# Patient Record
Sex: Female | Born: 1935
Health system: Southern US, Community
[De-identification: ages and names within clinical notes are randomized; demographics above are authoritative.]

## PROBLEM LIST (undated history)

## (undated) DIAGNOSIS — I10 Essential (primary) hypertension: Secondary | ICD-10-CM

## (undated) DIAGNOSIS — M5136 Other intervertebral disc degeneration, lumbar region: Secondary | ICD-10-CM

## (undated) DIAGNOSIS — E119 Type 2 diabetes mellitus without complications: Secondary | ICD-10-CM

## (undated) DIAGNOSIS — M51369 Other intervertebral disc degeneration, lumbar region without mention of lumbar back pain or lower extremity pain: Secondary | ICD-10-CM

## (undated) HISTORY — PX: KNEE SURGERY: SHX244

---

## 2001-01-31 ENCOUNTER — Encounter: Admission: RE | Admit: 2001-01-31 | Discharge: 2001-01-31 | Payer: Self-pay | Admitting: Internal Medicine

## 2001-01-31 ENCOUNTER — Encounter: Payer: Self-pay | Admitting: Internal Medicine

## 2002-02-26 ENCOUNTER — Encounter: Payer: Self-pay | Admitting: Internal Medicine

## 2002-02-26 ENCOUNTER — Encounter: Admission: RE | Admit: 2002-02-26 | Discharge: 2002-02-26 | Payer: Self-pay | Admitting: Internal Medicine

## 2002-05-16 ENCOUNTER — Ambulatory Visit (HOSPITAL_COMMUNITY): Admission: RE | Admit: 2002-05-16 | Discharge: 2002-05-16 | Payer: Self-pay | Admitting: Gastroenterology

## 2003-03-19 ENCOUNTER — Encounter: Admission: RE | Admit: 2003-03-19 | Discharge: 2003-03-19 | Payer: Self-pay | Admitting: Internal Medicine

## 2003-03-19 ENCOUNTER — Encounter: Payer: Self-pay | Admitting: Internal Medicine

## 2004-03-19 ENCOUNTER — Ambulatory Visit (HOSPITAL_COMMUNITY): Admission: RE | Admit: 2004-03-19 | Discharge: 2004-03-19 | Payer: Self-pay | Admitting: Internal Medicine

## 2007-04-24 ENCOUNTER — Ambulatory Visit (HOSPITAL_COMMUNITY): Admission: RE | Admit: 2007-04-24 | Discharge: 2007-04-24 | Payer: Self-pay | Admitting: Gastroenterology

## 2009-03-11 ENCOUNTER — Encounter: Admission: RE | Admit: 2009-03-11 | Discharge: 2009-03-11 | Payer: Self-pay | Admitting: Hematology and Oncology

## 2009-09-17 ENCOUNTER — Inpatient Hospital Stay (HOSPITAL_COMMUNITY): Admission: RE | Admit: 2009-09-17 | Discharge: 2009-09-21 | Payer: Self-pay | Admitting: Orthopedic Surgery

## 2009-11-11 ENCOUNTER — Encounter: Admission: RE | Admit: 2009-11-11 | Discharge: 2009-11-11 | Payer: Self-pay | Admitting: Internal Medicine

## 2010-11-21 ENCOUNTER — Inpatient Hospital Stay (HOSPITAL_COMMUNITY): Admission: EM | Admit: 2010-11-21 | Discharge: 2010-11-23 | Payer: Self-pay | Admitting: Internal Medicine

## 2011-03-09 LAB — GIARDIA/CRYPTOSPORIDIUM SCREEN(EIA)
Cryptosporidium Screen (EIA): NEGATIVE
Giardia Screen - EIA: NEGATIVE

## 2011-03-09 LAB — CBC
HCT: 35.5 % — ABNORMAL LOW (ref 36.0–46.0)
HCT: 43.8 % (ref 36.0–46.0)
Hemoglobin: 11.6 g/dL — ABNORMAL LOW (ref 12.0–15.0)
Hemoglobin: 14.5 g/dL (ref 12.0–15.0)
MCH: 29.8 pg (ref 26.0–34.0)
MCH: 30 pg (ref 26.0–34.0)
MCHC: 32.7 g/dL (ref 30.0–36.0)
MCHC: 33.1 g/dL (ref 30.0–36.0)
MCV: 89.9 fL (ref 78.0–100.0)
MCV: 91.7 fL (ref 78.0–100.0)
Platelets: 118 10*3/uL — ABNORMAL LOW (ref 150–400)
Platelets: 161 10*3/uL (ref 150–400)
RBC: 3.87 MIL/uL (ref 3.87–5.11)
RBC: 4.87 MIL/uL (ref 3.87–5.11)
RDW: 12.8 % (ref 11.5–15.5)
RDW: 13.1 % (ref 11.5–15.5)
WBC: 11.3 10*3/uL — ABNORMAL HIGH (ref 4.0–10.5)
WBC: 3.1 10*3/uL — ABNORMAL LOW (ref 4.0–10.5)

## 2011-03-09 LAB — POCT I-STAT, CHEM 8
BUN: 25 mg/dL — ABNORMAL HIGH (ref 6–23)
Calcium, Ion: 1.17 mmol/L (ref 1.12–1.32)
Chloride: 107 mEq/L (ref 96–112)
Creatinine, Ser: 0.9 mg/dL (ref 0.4–1.2)
Glucose, Bld: 161 mg/dL — ABNORMAL HIGH (ref 70–99)
HCT: 47 % — ABNORMAL HIGH (ref 36.0–46.0)
Hemoglobin: 16 g/dL — ABNORMAL HIGH (ref 12.0–15.0)
Potassium: 3.7 mEq/L (ref 3.5–5.1)
Sodium: 142 mEq/L (ref 135–145)
TCO2: 24 mmol/L (ref 0–100)

## 2011-03-09 LAB — GLUCOSE, CAPILLARY
Glucose-Capillary: 105 mg/dL — ABNORMAL HIGH (ref 70–99)
Glucose-Capillary: 107 mg/dL — ABNORMAL HIGH (ref 70–99)
Glucose-Capillary: 137 mg/dL — ABNORMAL HIGH (ref 70–99)
Glucose-Capillary: 153 mg/dL — ABNORMAL HIGH (ref 70–99)
Glucose-Capillary: 87 mg/dL (ref 70–99)
Glucose-Capillary: 89 mg/dL (ref 70–99)
Glucose-Capillary: 94 mg/dL (ref 70–99)

## 2011-03-09 LAB — COMPREHENSIVE METABOLIC PANEL
ALT: 16 U/L (ref 0–35)
AST: 21 U/L (ref 0–37)
Albumin: 4.3 g/dL (ref 3.5–5.2)
Alkaline Phosphatase: 52 U/L (ref 39–117)
BUN: 20 mg/dL (ref 6–23)
CO2: 24 mEq/L (ref 19–32)
Calcium: 9.7 mg/dL (ref 8.4–10.5)
Chloride: 105 mEq/L (ref 96–112)
Creatinine, Ser: 0.77 mg/dL (ref 0.4–1.2)
GFR calc Af Amer: >60 mL/min (ref >60)
GFR calc non Af Amer: >60 mL/min (ref >60)
Glucose, Bld: 162 mg/dL — ABNORMAL HIGH (ref 70–99)
Potassium: 3.7 mEq/L (ref 3.5–5.1)
Sodium: 139 mEq/L (ref 135–145)
Total Bilirubin: 0.8 mg/dL (ref 0.3–1.2)
Total Protein: 7.2 g/dL (ref 6.0–8.3)

## 2011-03-09 LAB — URINALYSIS, ROUTINE W REFLEX MICROSCOPIC
Bilirubin Urine: NEGATIVE
Glucose, UA: NEGATIVE mg/dL
Glucose, UA: NEGATIVE mg/dL
Hgb urine dipstick: NEGATIVE
Ketones, ur: NEGATIVE mg/dL
Ketones, ur: NEGATIVE mg/dL
Nitrite: NEGATIVE
Nitrite: NEGATIVE
Protein, ur: NEGATIVE mg/dL
Protein, ur: NEGATIVE mg/dL
Specific Gravity, Urine: 1.023 (ref 1.005–1.030)
Specific Gravity, Urine: 1.029 (ref 1.005–1.030)
Urobilinogen, UA: 0.2 mg/dL (ref 0.0–1.0)
Urobilinogen, UA: 0.2 mg/dL (ref 0.0–1.0)
pH: 5 (ref 5.0–8.0)
pH: 5 (ref 5.0–8.0)

## 2011-03-09 LAB — STOOL CULTURE

## 2011-03-09 LAB — DIFFERENTIAL
Basophils Absolute: 0 10*3/uL (ref 0.0–0.1)
Basophils Relative: 0 % (ref 0–1)
Eosinophils Absolute: 0 10*3/uL (ref 0.0–0.7)
Eosinophils Relative: 0 % (ref 0–5)
Lymphocytes Relative: 5 % — ABNORMAL LOW (ref 12–46)
Lymphs Abs: 0.5 10*3/uL — ABNORMAL LOW (ref 0.7–4.0)
Monocytes Absolute: 0.6 10*3/uL (ref 0.1–1.0)
Monocytes Relative: 6 % (ref 3–12)
Neutro Abs: 10.1 10*3/uL — ABNORMAL HIGH (ref 1.7–7.7)
Neutrophils Relative %: 89 % — ABNORMAL HIGH (ref 43–77)

## 2011-03-09 LAB — URINE MICROSCOPIC-ADD ON

## 2011-03-09 LAB — BASIC METABOLIC PANEL
BUN: 6 mg/dL (ref 6–23)
CO2: 26 mEq/L (ref 19–32)
Calcium: 7.7 mg/dL — ABNORMAL LOW (ref 8.4–10.5)
Chloride: 113 mEq/L — ABNORMAL HIGH (ref 96–112)
Creatinine, Ser: 0.66 mg/dL (ref 0.4–1.2)
GFR calc Af Amer: >60 mL/min (ref >60)
GFR calc non Af Amer: >60 mL/min (ref >60)
Glucose, Bld: 90 mg/dL (ref 70–99)
Potassium: 3.4 mEq/L — ABNORMAL LOW (ref 3.5–5.1)
Sodium: 143 mEq/L (ref 135–145)

## 2011-03-09 LAB — LIPASE, BLOOD: Lipase: 25 U/L (ref 11–59)

## 2011-03-09 LAB — LACTIC ACID, PLASMA: Lactic Acid, Venous: 1.7 mmol/L (ref 0.5–2.2)

## 2011-04-02 LAB — CBC
HCT: 24 % — ABNORMAL LOW (ref 36.0–46.0)
HCT: 26 % — ABNORMAL LOW (ref 36.0–46.0)
HCT: 30 % — ABNORMAL LOW (ref 36.0–46.0)
HCT: 41.8 % (ref 36.0–46.0)
Hemoglobin: 10.4 g/dL — ABNORMAL LOW (ref 12.0–15.0)
Hemoglobin: 14.4 g/dL (ref 12.0–15.0)
Hemoglobin: 8.4 g/dL — ABNORMAL LOW (ref 12.0–15.0)
Hemoglobin: 8.9 g/dL — ABNORMAL LOW (ref 12.0–15.0)
MCHC: 34.3 g/dL (ref 30.0–36.0)
MCHC: 34.5 g/dL (ref 30.0–36.0)
MCHC: 34.5 g/dL (ref 30.0–36.0)
MCHC: 35.1 g/dL (ref 30.0–36.0)
MCV: 91.1 fL (ref 78.0–100.0)
MCV: 91.4 fL (ref 78.0–100.0)
MCV: 92 fL (ref 78.0–100.0)
MCV: 92.6 fL (ref 78.0–100.0)
Platelets: 139 10*3/uL — ABNORMAL LOW (ref 150–400)
Platelets: 178 10*3/uL (ref 150–400)
Platelets: 94 10*3/uL — ABNORMAL LOW (ref 150–400)
Platelets: 96 10*3/uL — ABNORMAL LOW (ref 150–400)
RBC: 2.63 MIL/uL — ABNORMAL LOW (ref 3.87–5.11)
RBC: 2.84 MIL/uL — ABNORMAL LOW (ref 3.87–5.11)
RBC: 3.24 MIL/uL — ABNORMAL LOW (ref 3.87–5.11)
RBC: 4.54 MIL/uL (ref 3.87–5.11)
RDW: 12.8 % (ref 11.5–15.5)
RDW: 12.8 % (ref 11.5–15.5)
RDW: 12.9 % (ref 11.5–15.5)
RDW: 13.2 % (ref 11.5–15.5)
WBC: 10 10*3/uL (ref 4.0–10.5)
WBC: 5.8 10*3/uL (ref 4.0–10.5)
WBC: 6.3 10*3/uL (ref 4.0–10.5)
WBC: 7.1 10*3/uL (ref 4.0–10.5)

## 2011-04-02 LAB — URINALYSIS, ROUTINE W REFLEX MICROSCOPIC
Bilirubin Urine: NEGATIVE
Bilirubin Urine: NEGATIVE
Glucose, UA: NEGATIVE mg/dL
Glucose, UA: NEGATIVE mg/dL
Hgb urine dipstick: NEGATIVE
Ketones, ur: NEGATIVE mg/dL
Ketones, ur: NEGATIVE mg/dL
Nitrite: NEGATIVE
Nitrite: NEGATIVE
Protein, ur: NEGATIVE mg/dL
Protein, ur: NEGATIVE mg/dL
Specific Gravity, Urine: 1.01 (ref 1.005–1.030)
Specific Gravity, Urine: 1.012 (ref 1.005–1.030)
Urobilinogen, UA: 0.2 mg/dL (ref 0.0–1.0)
Urobilinogen, UA: 0.2 mg/dL (ref 0.0–1.0)
pH: 5.5 (ref 5.0–8.0)
pH: 5.5 (ref 5.0–8.0)

## 2011-04-02 LAB — BASIC METABOLIC PANEL
BUN: 12 mg/dL (ref 6–23)
BUN: 6 mg/dL (ref 6–23)
CO2: 29 mEq/L (ref 19–32)
CO2: 31 mEq/L (ref 19–32)
Calcium: 8.1 mg/dL — ABNORMAL LOW (ref 8.4–10.5)
Calcium: 8.5 mg/dL (ref 8.4–10.5)
Chloride: 100 mEq/L (ref 96–112)
Chloride: 102 mEq/L (ref 96–112)
Creatinine, Ser: 0.68 mg/dL (ref 0.4–1.2)
Creatinine, Ser: 0.76 mg/dL (ref 0.4–1.2)
GFR calc Af Amer: >60 mL/min (ref >60)
GFR calc Af Amer: >60 mL/min (ref >60)
GFR calc non Af Amer: >60 mL/min (ref >60)
GFR calc non Af Amer: >60 mL/min (ref >60)
Glucose, Bld: 164 mg/dL — ABNORMAL HIGH (ref 70–99)
Glucose, Bld: 166 mg/dL — ABNORMAL HIGH (ref 70–99)
Potassium: 3.7 mEq/L (ref 3.5–5.1)
Potassium: 4.2 mEq/L (ref 3.5–5.1)
Sodium: 133 mEq/L — ABNORMAL LOW (ref 135–145)
Sodium: 136 mEq/L (ref 135–145)

## 2011-04-02 LAB — COMPREHENSIVE METABOLIC PANEL
ALT: 18 U/L (ref 0–35)
AST: 24 U/L (ref 0–37)
Albumin: 4.2 g/dL (ref 3.5–5.2)
Alkaline Phosphatase: 49 U/L (ref 39–117)
BUN: 12 mg/dL (ref 6–23)
CO2: 27 mEq/L (ref 19–32)
Calcium: 10 mg/dL (ref 8.4–10.5)
Chloride: 106 mEq/L (ref 96–112)
Creatinine, Ser: 0.78 mg/dL (ref 0.4–1.2)
GFR calc Af Amer: >60 mL/min (ref >60)
GFR calc non Af Amer: >60 mL/min (ref >60)
Glucose, Bld: 134 mg/dL — ABNORMAL HIGH (ref 70–99)
Potassium: 4.3 mEq/L (ref 3.5–5.1)
Sodium: 141 mEq/L (ref 135–145)
Total Bilirubin: 0.9 mg/dL (ref 0.3–1.2)
Total Protein: 6.8 g/dL (ref 6.0–8.3)

## 2011-04-02 LAB — URINE MICROSCOPIC-ADD ON

## 2011-04-02 LAB — DIFFERENTIAL
Basophils Absolute: 0 10*3/uL (ref 0.0–0.1)
Basophils Relative: 0 % (ref 0–1)
Eosinophils Absolute: 0.1 10*3/uL (ref 0.0–0.7)
Eosinophils Relative: 2 % (ref 0–5)
Lymphocytes Relative: 33 % (ref 12–46)
Lymphs Abs: 1.9 10*3/uL (ref 0.7–4.0)
Monocytes Absolute: 0.4 10*3/uL (ref 0.1–1.0)
Monocytes Relative: 7 % (ref 3–12)
Neutro Abs: 3.4 10*3/uL (ref 1.7–7.7)
Neutrophils Relative %: 59 % (ref 43–77)

## 2011-04-02 LAB — TYPE AND SCREEN
ABO/RH(D): A POS
Antibody Screen: NEGATIVE

## 2011-04-02 LAB — PROTIME-INR
INR: 1 (ref 0.00–1.49)
INR: 1.1 (ref 0.00–1.49)
INR: 1.2 (ref 0.00–1.49)
INR: 1.5 (ref 0.00–1.49)
INR: 1.6 — ABNORMAL HIGH (ref 0.00–1.49)
Prothrombin Time: 12.7 seconds (ref 11.6–15.2)
Prothrombin Time: 13.8 seconds (ref 11.6–15.2)
Prothrombin Time: 14.9 seconds (ref 11.6–15.2)
Prothrombin Time: 18.3 seconds — ABNORMAL HIGH (ref 11.6–15.2)
Prothrombin Time: 18.6 seconds — ABNORMAL HIGH (ref 11.6–15.2)

## 2011-04-02 LAB — URINE CULTURE
Colony Count: NO GROWTH
Culture: NO GROWTH

## 2011-04-02 LAB — APTT: aPTT: 25 seconds (ref 24–37)

## 2011-04-02 LAB — ABO/RH: ABO/RH(D): A POS

## 2011-05-14 NOTE — Procedures (Signed)
[place]  Patient:    Molly Meyer, BOUIE Visit Number: [id number] MRN: [id number]          Service Type: END Location: ENDO Attending Physician:  Charna Elizabeth Dictated by:   Anselmo Rod, M.D. Proc. Date: 05/16/02 Admit Date:  05/16/2002   CC:         Velna Hatchet, M.D.   Procedure Report  DATE OF BIRTH:  March 03, 1934.  PROCEDURE:  Screening colonoscopy.  ENDOSCOPIST:  Anselmo Rod, M.D.  INSTRUMENT USED:  Adjustable pediatric colonoscope.  INDICATION FOR PROCEDURE:  A 77 year old white female undergoing screening colonoscopy.  The patients brother and sister have had colon cancer.  Rule out colonic polyps, masses, hemorrhoids, etc.  PREPROCEDURE PREPARATION:  Informed consent was procured from the patient. The patient was fasted for eight hours prior to the procedure and prepped with a bottle of magnesium citrate and a gallon of NuLytely the night prior to the procedure.  PREPROCEDURE PHYSICAL:  VITAL SIGNS:  The patient had stable vital signs.  NECK:  Supple.  CHEST:  Clear to auscultation.  S1, S2 regular.  ABDOMEN:  Soft with normal bowel sounds.  DESCRIPTION OF PROCEDURE:  The patient was placed in the left lateral decubitus position and sedated with 50 mg of Demerol and 4 mg of Versed intravenously.  Once the patient was adequately sedate and maintained on low-flow oxygen and continuous cardiac monitoring, the Olympus video colonoscope was advanced from the rectum to the cecum with slight difficulty. There was some residual stool in the colon.  Multiple washes were done.  A few left-sided diverticula were seen.  Small internal hemorrhoids were appreciated on retroflexion in the rectum.  No masses or polyps were present.  IMPRESSION: 1. Left-sided diverticulosis. 2. Small, nonbleeding internal hemorrhoids. 3. No masses or polyps seen.  RECOMMENDATIONS: 1. A high-fiber diet has been recommended for the  patient. 2. Repeat colorectal cancer screening is recommended in the next five years    unless the patient were to develop any abnormal symptoms in the interim. 3. Outpatient follow-up on a p.r.n. basis. Dictated by:   Anselmo Rod, M.D. Attending Physician:  Charna Elizabeth DD:  05/16/02 TD:  05/17/02 Job: [id number]

## 2011-05-14 NOTE — Op Note (Signed)
NAME:  Molly Meyer, Molly Meyer              ACCOUNT NO.:  [id number]   MEDICAL RECORD NO.:  [id number]          PATIENT TYPE:  AMB   LOCATION:  ENDO                         FACILITY:  [facility]   PHYSICIAN:  Anselmo Rod, M.D.  DATE OF BIRTH:  06-21-1924   DATE OF PROCEDURE:  04/24/2007  DATE OF DISCHARGE:                               OPERATIVE REPORT   PROCEDURE PERFORMED:  Screening colonoscopy.   INSTRUMENT USED:  Pentax video colonoscope.   INDICATION FOR PROCEDURE:  An 76 year old white female with a family  history of colon cancer in a brother and a sister undergoing a screening  colonoscopy to rule out colonic polyps, masses, etc.  Pre-procedure  preparation and informed consent was secured from the patient.  The  patient fasted for 8 hours prior to the procedure, and prepped Dulcolax  pills and a bottle of magnesium citrate, along with a gallon of TriLyte  the night prior to the procedure. The risks and benefits of the  procedure, including anesthetic risks and missing cancer and polyp were  discussed with the patient as well.  On pre-procedure physical, the  patient has stable vital signs, neck supple, chest clear to  auscultation, S1 and S2 regular, and the abdomen was soft, with normal  bowel sounds.   DESCRIPTION OF THE PROCEDURE:  The patient was placed in the left  lateral decubitus position and sedated with 75 mcg of Fentanyl and 7.5  mg of Versed, given intravenously in slow incremental doses. Once the  patient was adequately sedated, maintained on low-flow oxygen and  continuous cardiac monitoring, the Pentax video colonoscope was advanced  from the rectum to the cecum. There was some residual stool in the  colon. Multiple washes were done. The appendiceal orifice and the  ileocecal valve were clearly visualized and photographed. A few  scattered diverticula were seen, with even more prominent changes in the  sigmoid colon.  Small internal hemorrhoids were appreciated on  retroflexion in the rectum. No masses or polyps were identified.  The  patient tolerated the procedure well, without complication.   IMPRESSION:  1. Some residual stool in the right colon, multiple washings done.  2. A few scattered diverticula with more prominent changes in the      sigmoid colon.  3. Small internal hemorrhoids.   RECOMMENDATIONS:  1. Continue a high-fiber diet with liberal fluid intake. Brochures on      diverticulosis have been given the patient for education.  2. The patient has a family history of colon cancer in 2 of first-      degree relatives.  Repeat colonoscopy will be done in the next 5      years.  If the patient has any abnormal symptoms, she is to contact      the office immediately for further recommendations.      Anselmo Rod, M.D.  Electronically Signed     JNM/MEDQ  D:  04/24/2007  T:  04/24/2007  Job:  [id number]   cc:   Candyce Churn. Allyne Gee, M.D.

## 2012-02-21 DIAGNOSIS — E1129 Type 2 diabetes mellitus with other diabetic kidney complication: Secondary | ICD-10-CM | POA: Diagnosis not present

## 2012-02-21 DIAGNOSIS — M2550 Pain in unspecified joint: Secondary | ICD-10-CM | POA: Diagnosis not present

## 2012-02-21 DIAGNOSIS — Z79899 Other long term (current) drug therapy: Secondary | ICD-10-CM | POA: Diagnosis not present

## 2012-02-21 DIAGNOSIS — Z0000 Encounter for general adult medical examination without abnormal findings: Secondary | ICD-10-CM | POA: Diagnosis not present

## 2012-02-21 DIAGNOSIS — N182 Chronic kidney disease, stage 2 (mild): Secondary | ICD-10-CM | POA: Diagnosis not present

## 2012-02-21 DIAGNOSIS — E7800 Pure hypercholesterolemia, unspecified: Secondary | ICD-10-CM | POA: Diagnosis not present

## 2012-02-21 DIAGNOSIS — I129 Hypertensive chronic kidney disease with stage 1 through stage 4 chronic kidney disease, or unspecified chronic kidney disease: Secondary | ICD-10-CM | POA: Diagnosis not present

## 2012-02-22 DIAGNOSIS — I10 Essential (primary) hypertension: Secondary | ICD-10-CM | POA: Diagnosis not present

## 2012-02-22 DIAGNOSIS — E1129 Type 2 diabetes mellitus with other diabetic kidney complication: Secondary | ICD-10-CM | POA: Diagnosis not present

## 2012-02-22 DIAGNOSIS — E7800 Pure hypercholesterolemia, unspecified: Secondary | ICD-10-CM | POA: Diagnosis not present

## 2012-03-21 DIAGNOSIS — K5730 Diverticulosis of large intestine without perforation or abscess without bleeding: Secondary | ICD-10-CM | POA: Diagnosis not present

## 2012-03-21 DIAGNOSIS — Z1211 Encounter for screening for malignant neoplasm of colon: Secondary | ICD-10-CM | POA: Diagnosis not present

## 2012-03-21 DIAGNOSIS — K5900 Constipation, unspecified: Secondary | ICD-10-CM | POA: Diagnosis not present

## 2012-03-21 DIAGNOSIS — Z800 Family history of malignant neoplasm of digestive organs: Secondary | ICD-10-CM | POA: Diagnosis not present

## 2012-04-12 DIAGNOSIS — E7800 Pure hypercholesterolemia, unspecified: Secondary | ICD-10-CM | POA: Diagnosis not present

## 2012-04-12 DIAGNOSIS — Z6827 Body mass index (BMI) 27.0-27.9, adult: Secondary | ICD-10-CM | POA: Diagnosis not present

## 2012-05-01 DIAGNOSIS — Z961 Presence of intraocular lens: Secondary | ICD-10-CM | POA: Diagnosis not present

## 2012-05-01 DIAGNOSIS — H2640 Unspecified secondary cataract: Secondary | ICD-10-CM | POA: Diagnosis not present

## 2012-05-01 DIAGNOSIS — E119 Type 2 diabetes mellitus without complications: Secondary | ICD-10-CM | POA: Diagnosis not present

## 2012-05-02 DIAGNOSIS — D2310 Other benign neoplasm of skin of unspecified eyelid, including canthus: Secondary | ICD-10-CM | POA: Diagnosis not present

## 2012-05-31 DIAGNOSIS — Z1211 Encounter for screening for malignant neoplasm of colon: Secondary | ICD-10-CM | POA: Diagnosis not present

## 2012-05-31 DIAGNOSIS — K5730 Diverticulosis of large intestine without perforation or abscess without bleeding: Secondary | ICD-10-CM | POA: Diagnosis not present

## 2012-05-31 DIAGNOSIS — Z800 Family history of malignant neoplasm of digestive organs: Secondary | ICD-10-CM | POA: Diagnosis not present

## 2012-06-19 DIAGNOSIS — E1129 Type 2 diabetes mellitus with other diabetic kidney complication: Secondary | ICD-10-CM | POA: Diagnosis not present

## 2012-06-19 DIAGNOSIS — Z79899 Other long term (current) drug therapy: Secondary | ICD-10-CM | POA: Diagnosis not present

## 2012-06-19 DIAGNOSIS — E7800 Pure hypercholesterolemia, unspecified: Secondary | ICD-10-CM | POA: Diagnosis not present

## 2012-06-19 DIAGNOSIS — N182 Chronic kidney disease, stage 2 (mild): Secondary | ICD-10-CM | POA: Diagnosis not present

## 2012-06-19 DIAGNOSIS — D692 Other nonthrombocytopenic purpura: Secondary | ICD-10-CM | POA: Diagnosis not present

## 2012-08-21 DIAGNOSIS — E1165 Type 2 diabetes mellitus with hyperglycemia: Secondary | ICD-10-CM | POA: Diagnosis not present

## 2012-08-21 DIAGNOSIS — Z79899 Other long term (current) drug therapy: Secondary | ICD-10-CM | POA: Diagnosis not present

## 2012-08-21 DIAGNOSIS — I129 Hypertensive chronic kidney disease with stage 1 through stage 4 chronic kidney disease, or unspecified chronic kidney disease: Secondary | ICD-10-CM | POA: Diagnosis not present

## 2012-08-21 DIAGNOSIS — E1129 Type 2 diabetes mellitus with other diabetic kidney complication: Secondary | ICD-10-CM | POA: Diagnosis not present

## 2012-08-21 DIAGNOSIS — N182 Chronic kidney disease, stage 2 (mild): Secondary | ICD-10-CM | POA: Diagnosis not present

## 2012-10-04 DIAGNOSIS — Z23 Encounter for immunization: Secondary | ICD-10-CM | POA: Diagnosis not present

## 2012-11-29 DIAGNOSIS — J209 Acute bronchitis, unspecified: Secondary | ICD-10-CM | POA: Diagnosis not present

## 2013-02-20 DIAGNOSIS — M899 Disorder of bone, unspecified: Secondary | ICD-10-CM | POA: Diagnosis not present

## 2013-02-20 DIAGNOSIS — M949 Disorder of cartilage, unspecified: Secondary | ICD-10-CM | POA: Diagnosis not present

## 2013-02-21 DIAGNOSIS — N182 Chronic kidney disease, stage 2 (mild): Secondary | ICD-10-CM | POA: Diagnosis not present

## 2013-02-21 DIAGNOSIS — E1129 Type 2 diabetes mellitus with other diabetic kidney complication: Secondary | ICD-10-CM | POA: Diagnosis not present

## 2013-02-21 DIAGNOSIS — Z79899 Other long term (current) drug therapy: Secondary | ICD-10-CM | POA: Diagnosis not present

## 2013-02-21 DIAGNOSIS — Z0000 Encounter for general adult medical examination without abnormal findings: Secondary | ICD-10-CM | POA: Diagnosis not present

## 2013-02-21 DIAGNOSIS — I129 Hypertensive chronic kidney disease with stage 1 through stage 4 chronic kidney disease, or unspecified chronic kidney disease: Secondary | ICD-10-CM | POA: Diagnosis not present

## 2013-02-21 DIAGNOSIS — E559 Vitamin D deficiency, unspecified: Secondary | ICD-10-CM | POA: Diagnosis not present

## 2013-02-21 DIAGNOSIS — E1165 Type 2 diabetes mellitus with hyperglycemia: Secondary | ICD-10-CM | POA: Diagnosis not present

## 2013-05-02 DIAGNOSIS — H04129 Dry eye syndrome of unspecified lacrimal gland: Secondary | ICD-10-CM | POA: Diagnosis not present

## 2013-05-02 DIAGNOSIS — H2640 Unspecified secondary cataract: Secondary | ICD-10-CM | POA: Diagnosis not present

## 2013-05-02 DIAGNOSIS — E119 Type 2 diabetes mellitus without complications: Secondary | ICD-10-CM | POA: Diagnosis not present

## 2013-05-02 DIAGNOSIS — Z961 Presence of intraocular lens: Secondary | ICD-10-CM | POA: Diagnosis not present

## 2013-06-20 DIAGNOSIS — Z79899 Other long term (current) drug therapy: Secondary | ICD-10-CM | POA: Diagnosis not present

## 2013-06-20 DIAGNOSIS — N182 Chronic kidney disease, stage 2 (mild): Secondary | ICD-10-CM | POA: Diagnosis not present

## 2013-06-20 DIAGNOSIS — E1165 Type 2 diabetes mellitus with hyperglycemia: Secondary | ICD-10-CM | POA: Diagnosis not present

## 2013-06-20 DIAGNOSIS — I129 Hypertensive chronic kidney disease with stage 1 through stage 4 chronic kidney disease, or unspecified chronic kidney disease: Secondary | ICD-10-CM | POA: Diagnosis not present

## 2013-06-20 DIAGNOSIS — E1129 Type 2 diabetes mellitus with other diabetic kidney complication: Secondary | ICD-10-CM | POA: Diagnosis not present

## 2013-06-20 DIAGNOSIS — L821 Other seborrheic keratosis: Secondary | ICD-10-CM | POA: Diagnosis not present

## 2013-07-27 DIAGNOSIS — I129 Hypertensive chronic kidney disease with stage 1 through stage 4 chronic kidney disease, or unspecified chronic kidney disease: Secondary | ICD-10-CM | POA: Diagnosis not present

## 2013-07-27 DIAGNOSIS — N182 Chronic kidney disease, stage 2 (mild): Secondary | ICD-10-CM | POA: Diagnosis not present

## 2013-07-27 DIAGNOSIS — IMO0001 Reserved for inherently not codable concepts without codable children: Secondary | ICD-10-CM | POA: Diagnosis not present

## 2013-07-27 DIAGNOSIS — N898 Other specified noninflammatory disorders of vagina: Secondary | ICD-10-CM | POA: Diagnosis not present

## 2013-07-31 DIAGNOSIS — M76829 Posterior tibial tendinitis, unspecified leg: Secondary | ICD-10-CM | POA: Diagnosis not present

## 2013-07-31 DIAGNOSIS — L608 Other nail disorders: Secondary | ICD-10-CM | POA: Diagnosis not present

## 2013-07-31 DIAGNOSIS — E1149 Type 2 diabetes mellitus with other diabetic neurological complication: Secondary | ICD-10-CM | POA: Diagnosis not present

## 2013-08-21 DIAGNOSIS — N950 Postmenopausal bleeding: Secondary | ICD-10-CM | POA: Diagnosis not present

## 2013-08-21 DIAGNOSIS — Z124 Encounter for screening for malignant neoplasm of cervix: Secondary | ICD-10-CM | POA: Diagnosis not present

## 2013-08-28 DIAGNOSIS — E1149 Type 2 diabetes mellitus with other diabetic neurological complication: Secondary | ICD-10-CM | POA: Diagnosis not present

## 2013-08-28 DIAGNOSIS — M76829 Posterior tibial tendinitis, unspecified leg: Secondary | ICD-10-CM | POA: Diagnosis not present

## 2013-08-28 DIAGNOSIS — M19079 Primary osteoarthritis, unspecified ankle and foot: Secondary | ICD-10-CM | POA: Diagnosis not present

## 2013-08-30 DIAGNOSIS — N950 Postmenopausal bleeding: Secondary | ICD-10-CM | POA: Diagnosis not present

## 2013-09-21 DIAGNOSIS — Z23 Encounter for immunization: Secondary | ICD-10-CM | POA: Diagnosis not present

## 2013-10-10 DIAGNOSIS — E1149 Type 2 diabetes mellitus with other diabetic neurological complication: Secondary | ICD-10-CM | POA: Diagnosis not present

## 2013-10-10 DIAGNOSIS — M76829 Posterior tibial tendinitis, unspecified leg: Secondary | ICD-10-CM | POA: Diagnosis not present

## 2013-10-10 DIAGNOSIS — M19079 Primary osteoarthritis, unspecified ankle and foot: Secondary | ICD-10-CM | POA: Diagnosis not present

## 2013-10-17 DIAGNOSIS — Z79899 Other long term (current) drug therapy: Secondary | ICD-10-CM | POA: Diagnosis not present

## 2013-10-17 DIAGNOSIS — N189 Chronic kidney disease, unspecified: Secondary | ICD-10-CM | POA: Diagnosis not present

## 2013-10-17 DIAGNOSIS — E1129 Type 2 diabetes mellitus with other diabetic kidney complication: Secondary | ICD-10-CM | POA: Diagnosis not present

## 2013-10-17 DIAGNOSIS — I129 Hypertensive chronic kidney disease with stage 1 through stage 4 chronic kidney disease, or unspecified chronic kidney disease: Secondary | ICD-10-CM | POA: Diagnosis not present

## 2013-10-17 DIAGNOSIS — E663 Overweight: Secondary | ICD-10-CM | POA: Insufficient documentation

## 2013-10-17 DIAGNOSIS — E1165 Type 2 diabetes mellitus with hyperglycemia: Secondary | ICD-10-CM | POA: Diagnosis not present

## 2013-10-17 DIAGNOSIS — N058 Unspecified nephritic syndrome with other morphologic changes: Secondary | ICD-10-CM | POA: Diagnosis not present

## 2013-10-17 DIAGNOSIS — N182 Chronic kidney disease, stage 2 (mild): Secondary | ICD-10-CM | POA: Diagnosis not present

## 2013-10-17 DIAGNOSIS — Z006 Encounter for examination for normal comparison and control in clinical research program: Secondary | ICD-10-CM | POA: Diagnosis not present

## 2013-10-18 DIAGNOSIS — M899 Disorder of bone, unspecified: Secondary | ICD-10-CM | POA: Insufficient documentation

## 2013-10-18 DIAGNOSIS — E7800 Pure hypercholesterolemia, unspecified: Secondary | ICD-10-CM | POA: Insufficient documentation

## 2013-10-18 DIAGNOSIS — E1121 Type 2 diabetes mellitus with diabetic nephropathy: Secondary | ICD-10-CM | POA: Insufficient documentation

## 2013-10-18 DIAGNOSIS — I129 Hypertensive chronic kidney disease with stage 1 through stage 4 chronic kidney disease, or unspecified chronic kidney disease: Secondary | ICD-10-CM | POA: Insufficient documentation

## 2013-10-18 DIAGNOSIS — L821 Other seborrheic keratosis: Secondary | ICD-10-CM | POA: Insufficient documentation

## 2013-10-18 DIAGNOSIS — N182 Chronic kidney disease, stage 2 (mild): Secondary | ICD-10-CM | POA: Insufficient documentation

## 2013-10-18 DIAGNOSIS — M2550 Pain in unspecified joint: Secondary | ICD-10-CM | POA: Insufficient documentation

## 2013-10-18 DIAGNOSIS — M949 Disorder of cartilage, unspecified: Secondary | ICD-10-CM | POA: Insufficient documentation

## 2013-10-18 DIAGNOSIS — R310 Gross hematuria: Secondary | ICD-10-CM | POA: Insufficient documentation

## 2013-11-28 DIAGNOSIS — N950 Postmenopausal bleeding: Secondary | ICD-10-CM | POA: Diagnosis not present

## 2013-12-23 ENCOUNTER — Encounter (HOSPITAL_COMMUNITY): Payer: Self-pay | Admitting: Emergency Medicine

## 2013-12-23 ENCOUNTER — Emergency Department (INDEPENDENT_AMBULATORY_CARE_PROVIDER_SITE_OTHER): Payer: Medicare Other

## 2013-12-23 ENCOUNTER — Emergency Department (INDEPENDENT_AMBULATORY_CARE_PROVIDER_SITE_OTHER)
Admission: EM | Admit: 2013-12-23 | Discharge: 2013-12-23 | Disposition: A | Discharge status: Home / Self Care | Payer: Medicare Other | Source: Home / Self Care | Attending: Emergency Medicine | Admitting: Emergency Medicine

## 2013-12-23 DIAGNOSIS — R05 Cough: Secondary | ICD-10-CM | POA: Diagnosis not present

## 2013-12-23 DIAGNOSIS — J40 Bronchitis, not specified as acute or chronic: Secondary | ICD-10-CM

## 2013-12-23 DIAGNOSIS — R059 Cough, unspecified: Secondary | ICD-10-CM | POA: Diagnosis not present

## 2013-12-23 HISTORY — DX: Type 2 diabetes mellitus without complications: E11.9

## 2013-12-23 HISTORY — DX: Essential (primary) hypertension: I10

## 2013-12-23 MED ORDER — AZITHROMYCIN 250 MG PO TABS: 250.0000 mg | ORAL_TABLET | Freq: Every day | ORAL | Status: DC

## 2013-12-23 NOTE — ED Provider Notes (Signed)
CSN: [id number]     Arrival date & time 12/23/13  1146 History   First MD Initiated Contact with Patient 12/23/13 1301     Chief Complaint  Patient presents with  . Cough   (Consider location/radiation/quality/duration/timing/severity/associated sxs/prior Treatment) Patient is a 79 y.o. female presenting with cough. The history is provided by the patient. No language interpreter was used.  Cough Cough characteristics:  Productive Sputum characteristics:  Nondescript Severity:  Moderate Onset quality:  Sudden Duration:  2 days Timing:  Constant Progression:  Worsening Chronicity:  New Smoker: no   Relieved by:  Nothing Worsened by:  Nothing tried Ineffective treatments:  None tried Associated symptoms: chest pain and shortness of breath     Past Medical History  Diagnosis Date  . Diabetes mellitus without complication   . Hypertension    Past Surgical History  Procedure Laterality Date  . Knee surgery     History reviewed. No pertinent family history. History  Substance Use Topics  . Smoking status: Never Smoker   . Smokeless tobacco: Not on file  . Alcohol Use: No   OB History   Grav Para Term Preterm Abortions TAB SAB Ect Mult Living                 Review of Systems  Respiratory: Positive for cough and shortness of breath.   Cardiovascular: Positive for chest pain.  All other systems reviewed and are negative.    Allergies  Penicillins  Home Medications   Current Outpatient Rx  Name  Route  Sig  Dispense  Refill  . lisinopril-hydrochlorothiazide (PRINZIDE,ZESTORETIC) 10-12.5 MG per tablet   Oral   Take 1 tablet by mouth daily.         Marland Kitchen METFORMIN HCL ER PO   Oral   Take by mouth.         . Rosuvastatin Calcium (CRESTOR PO)   Oral   Take by mouth.          BP 141/87  Pulse 89  Temp(Src) 101.2 F (38.4 C) (Oral)  Resp 18  SpO2 92% Physical Exam  Nursing note and vitals reviewed. Constitutional: She is oriented to person, place,  and time. She appears well-developed and well-nourished.  HENT:  Head: Normocephalic.  Right Ear: External ear normal.  Left Ear: External ear normal.  Nose: Nose normal.  Mouth/Throat: Oropharynx is clear and moist.  Eyes: Conjunctivae and EOM are normal. Pupils are equal, round, and reactive to light.  Neck: Normal range of motion. Neck supple.  Cardiovascular: Normal rate.   Pulmonary/Chest: Effort normal and breath sounds normal.  Abdominal: Soft. Bowel sounds are normal.  Musculoskeletal: Normal range of motion.  Neurological: She is alert and oriented to person, place, and time. She has normal reflexes.  Skin: Skin is warm.  Psychiatric: She has a normal mood and affect.    ED Course  Procedures (including critical care time) Labs Review Labs Reviewed - No data to display Imaging Review Dg Chest 2 View  12/23/2013   CLINICAL DATA:  Two-day history of nonproductive cough.  EXAM: CHEST  2 VIEW  COMPARISON:  PA and lateral chest of March 11, 2009.  FINDINGS: The lungs are reasonably well inflated. There are coarse lung markings on the frontal film in the infrahilar region on the right. These likely lie posteriorly on the lateral film. These appears stable. Stable rounded calcified nodules in the right mid lung measuring up to 3 mm in diameter are consistent with previous granulomatous  infection. The cardiac silhouette is normal in size. There is tortuosity of the descending thoracic aorta. There is no pleural effusion or pneumothorax. The observed portions of the bony thorax exhibit no acute abnormalities.  IMPRESSION: There is no evidence of pneumonia nor CHF. I cannot exclude acute bronchitis in the appropriate clinical setting.   Electronically Signed   By: David  [place]   On: 12/23/2013 13:50    EKG Interpretation    Date/Time:    Ventricular Rate:    PR Interval:    QRS Duration:   QT Interval:    QTC Calculation:   R Axis:     Text Interpretation:               MDM   1. Bronchitis     Zithromax    Elson Areas, [place] 12/23/13 1428

## 2013-12-23 NOTE — ED Notes (Signed)
Pt  Reports  Symptoms  Of  Cough  /  Congested         Runny  Nose       Sinus  Drainage            Symptoms  X    2  Days  The  Cough is  Non  Productive  The  Pt is  Sitting  Upright on  Exam table       Family member  Ill  As   Well  With  Similar  Symptoms

## 2013-12-24 NOTE — ED Provider Notes (Signed)
Medical screening examination/treatment/procedure(s) were performed by a resident physician or non-physician practitioner and as the supervising physician I was immediately available for consultation/collaboration.  Aradia Estey, MD    Nakira Litzau S Brodie Correll, MD 12/24/13 0741 

## 2013-12-29 DIAGNOSIS — J3489 Other specified disorders of nose and nasal sinuses: Secondary | ICD-10-CM | POA: Diagnosis not present

## 2013-12-29 DIAGNOSIS — Z09 Encounter for follow-up examination after completed treatment for conditions other than malignant neoplasm: Secondary | ICD-10-CM | POA: Diagnosis not present

## 2013-12-29 DIAGNOSIS — J069 Acute upper respiratory infection, unspecified: Secondary | ICD-10-CM | POA: Diagnosis not present

## 2014-04-22 DIAGNOSIS — Z23 Encounter for immunization: Secondary | ICD-10-CM | POA: Diagnosis not present

## 2014-04-22 DIAGNOSIS — Z0000 Encounter for general adult medical examination without abnormal findings: Secondary | ICD-10-CM | POA: Diagnosis not present

## 2014-04-22 DIAGNOSIS — E559 Vitamin D deficiency, unspecified: Secondary | ICD-10-CM | POA: Diagnosis not present

## 2014-04-22 DIAGNOSIS — N182 Chronic kidney disease, stage 2 (mild): Secondary | ICD-10-CM | POA: Diagnosis not present

## 2014-04-22 DIAGNOSIS — I129 Hypertensive chronic kidney disease with stage 1 through stage 4 chronic kidney disease, or unspecified chronic kidney disease: Secondary | ICD-10-CM | POA: Diagnosis not present

## 2014-04-22 DIAGNOSIS — E1129 Type 2 diabetes mellitus with other diabetic kidney complication: Secondary | ICD-10-CM | POA: Diagnosis not present

## 2014-04-22 DIAGNOSIS — R209 Unspecified disturbances of skin sensation: Secondary | ICD-10-CM | POA: Diagnosis not present

## 2014-04-22 DIAGNOSIS — N058 Unspecified nephritic syndrome with other morphologic changes: Secondary | ICD-10-CM | POA: Diagnosis not present

## 2014-04-28 DIAGNOSIS — J349 Unspecified disorder of nose and nasal sinuses: Secondary | ICD-10-CM | POA: Insufficient documentation

## 2014-04-28 DIAGNOSIS — J3489 Other specified disorders of nose and nasal sinuses: Secondary | ICD-10-CM | POA: Insufficient documentation

## 2014-04-28 DIAGNOSIS — J069 Acute upper respiratory infection, unspecified: Secondary | ICD-10-CM | POA: Insufficient documentation

## 2014-04-30 DIAGNOSIS — D518 Other vitamin B12 deficiency anemias: Secondary | ICD-10-CM | POA: Diagnosis not present

## 2014-04-30 DIAGNOSIS — R5381 Other malaise: Secondary | ICD-10-CM | POA: Diagnosis not present

## 2014-05-05 DIAGNOSIS — R5381 Other malaise: Secondary | ICD-10-CM | POA: Insufficient documentation

## 2014-05-05 DIAGNOSIS — R5383 Other fatigue: Secondary | ICD-10-CM | POA: Insufficient documentation

## 2014-05-08 DIAGNOSIS — D518 Other vitamin B12 deficiency anemias: Secondary | ICD-10-CM | POA: Diagnosis not present

## 2014-05-15 DIAGNOSIS — D518 Other vitamin B12 deficiency anemias: Secondary | ICD-10-CM | POA: Diagnosis not present

## 2014-06-06 DIAGNOSIS — H11449 Conjunctival cysts, unspecified eye: Secondary | ICD-10-CM | POA: Diagnosis not present

## 2014-06-11 DIAGNOSIS — D2310 Other benign neoplasm of skin of unspecified eyelid, including canthus: Secondary | ICD-10-CM | POA: Diagnosis not present

## 2014-06-12 DIAGNOSIS — N182 Chronic kidney disease, stage 2 (mild): Secondary | ICD-10-CM | POA: Diagnosis not present

## 2014-06-12 DIAGNOSIS — D518 Other vitamin B12 deficiency anemias: Secondary | ICD-10-CM | POA: Diagnosis not present

## 2014-06-12 DIAGNOSIS — I129 Hypertensive chronic kidney disease with stage 1 through stage 4 chronic kidney disease, or unspecified chronic kidney disease: Secondary | ICD-10-CM | POA: Diagnosis not present

## 2014-06-12 DIAGNOSIS — E1129 Type 2 diabetes mellitus with other diabetic kidney complication: Secondary | ICD-10-CM | POA: Diagnosis not present

## 2014-07-18 DIAGNOSIS — N182 Chronic kidney disease, stage 2 (mild): Secondary | ICD-10-CM | POA: Diagnosis not present

## 2014-07-18 DIAGNOSIS — N058 Unspecified nephritic syndrome with other morphologic changes: Secondary | ICD-10-CM | POA: Diagnosis not present

## 2014-07-18 DIAGNOSIS — D518 Other vitamin B12 deficiency anemias: Secondary | ICD-10-CM | POA: Diagnosis not present

## 2014-07-18 DIAGNOSIS — E1165 Type 2 diabetes mellitus with hyperglycemia: Secondary | ICD-10-CM | POA: Diagnosis not present

## 2014-07-18 DIAGNOSIS — E1129 Type 2 diabetes mellitus with other diabetic kidney complication: Secondary | ICD-10-CM | POA: Diagnosis not present

## 2014-07-19 DIAGNOSIS — Z961 Presence of intraocular lens: Secondary | ICD-10-CM | POA: Diagnosis not present

## 2014-07-19 DIAGNOSIS — H524 Presbyopia: Secondary | ICD-10-CM | POA: Diagnosis not present

## 2014-07-19 DIAGNOSIS — E119 Type 2 diabetes mellitus without complications: Secondary | ICD-10-CM | POA: Diagnosis not present

## 2014-07-19 DIAGNOSIS — H04129 Dry eye syndrome of unspecified lacrimal gland: Secondary | ICD-10-CM | POA: Diagnosis not present

## 2014-07-21 DIAGNOSIS — N289 Disorder of kidney and ureter, unspecified: Secondary | ICD-10-CM | POA: Insufficient documentation

## 2014-08-07 DIAGNOSIS — E1129 Type 2 diabetes mellitus with other diabetic kidney complication: Secondary | ICD-10-CM | POA: Diagnosis not present

## 2014-08-07 DIAGNOSIS — N182 Chronic kidney disease, stage 2 (mild): Secondary | ICD-10-CM | POA: Diagnosis not present

## 2014-08-07 DIAGNOSIS — J019 Acute sinusitis, unspecified: Secondary | ICD-10-CM | POA: Diagnosis not present

## 2014-08-07 DIAGNOSIS — N058 Unspecified nephritic syndrome with other morphologic changes: Secondary | ICD-10-CM | POA: Diagnosis not present

## 2014-09-23 DIAGNOSIS — N182 Chronic kidney disease, stage 2 (mild): Secondary | ICD-10-CM | POA: Diagnosis not present

## 2014-09-23 DIAGNOSIS — Z79899 Other long term (current) drug therapy: Secondary | ICD-10-CM | POA: Diagnosis not present

## 2014-09-23 DIAGNOSIS — D518 Other vitamin B12 deficiency anemias: Secondary | ICD-10-CM | POA: Diagnosis not present

## 2014-09-23 DIAGNOSIS — I129 Hypertensive chronic kidney disease with stage 1 through stage 4 chronic kidney disease, or unspecified chronic kidney disease: Secondary | ICD-10-CM | POA: Diagnosis not present

## 2014-09-23 DIAGNOSIS — Z23 Encounter for immunization: Secondary | ICD-10-CM | POA: Diagnosis not present

## 2014-11-07 DIAGNOSIS — I129 Hypertensive chronic kidney disease with stage 1 through stage 4 chronic kidney disease, or unspecified chronic kidney disease: Secondary | ICD-10-CM | POA: Diagnosis not present

## 2014-11-07 DIAGNOSIS — M79672 Pain in left foot: Secondary | ICD-10-CM | POA: Diagnosis not present

## 2014-11-07 DIAGNOSIS — E1121 Type 2 diabetes mellitus with diabetic nephropathy: Secondary | ICD-10-CM | POA: Diagnosis not present

## 2014-11-07 DIAGNOSIS — Z13820 Encounter for screening for osteoporosis: Secondary | ICD-10-CM | POA: Diagnosis not present

## 2014-11-07 DIAGNOSIS — N182 Chronic kidney disease, stage 2 (mild): Secondary | ICD-10-CM | POA: Diagnosis not present

## 2014-11-07 DIAGNOSIS — M85859 Other specified disorders of bone density and structure, unspecified thigh: Secondary | ICD-10-CM | POA: Diagnosis not present

## 2014-11-25 DIAGNOSIS — E1121 Type 2 diabetes mellitus with diabetic nephropathy: Secondary | ICD-10-CM | POA: Diagnosis not present

## 2014-12-04 ENCOUNTER — Ambulatory Visit: Payer: Self-pay | Admitting: Podiatry

## 2015-01-01 ENCOUNTER — Ambulatory Visit (INDEPENDENT_AMBULATORY_CARE_PROVIDER_SITE_OTHER): Payer: Medicare Other | Admitting: Podiatry

## 2015-01-01 ENCOUNTER — Encounter: Payer: Self-pay | Admitting: Podiatry

## 2015-01-01 VITALS — BP 135/77 | HR 69 | Ht 67.5 in | Wt 170.0 lb

## 2015-01-01 DIAGNOSIS — E1140 Type 2 diabetes mellitus with diabetic neuropathy, unspecified: Secondary | ICD-10-CM | POA: Insufficient documentation

## 2015-01-01 DIAGNOSIS — B351 Tinea unguium: Secondary | ICD-10-CM

## 2015-01-01 DIAGNOSIS — S93303A Unspecified subluxation of unspecified foot, initial encounter: Secondary | ICD-10-CM | POA: Insufficient documentation

## 2015-01-01 DIAGNOSIS — E0840 Diabetes mellitus due to underlying condition with diabetic neuropathy, unspecified: Secondary | ICD-10-CM | POA: Insufficient documentation

## 2015-01-01 DIAGNOSIS — M79606 Pain in leg, unspecified: Secondary | ICD-10-CM

## 2015-01-01 DIAGNOSIS — E0842 Diabetes mellitus due to underlying condition with diabetic polyneuropathy: Secondary | ICD-10-CM

## 2015-01-01 DIAGNOSIS — M216X9 Other acquired deformities of unspecified foot: Secondary | ICD-10-CM | POA: Diagnosis not present

## 2015-01-01 NOTE — Progress Notes (Signed)
SUBJECTIVE: 81 y.o. year old female presents for diabetic foot care. Patient is ambulatory without assistance. Stated that she has foot, ankle and leg pain with ambulation and numbness in her foot and leg. Stated that she frequently lose balance during ambulation.   REVIEW OF SYSTEMS: Constitutional: negative for anorexia, chills, fatigue, fevers and sweats Eyes: negative Ears, nose, mouth, throat, and face: negative Respiratory: negative Cardiovascular: negative Gastrointestinal: negative Genitourinary:negative Integument/breast: negative Hematologic/lymphatic: negative Musculoskeletal:Has bursitis on shoulder, arthritis in knees and had total knee surgery on both side. Neurological: negative Behavioral/Psych: negative Endocrine: negative Allergic/Immunologic: negative  OBJECTIVE: DERMATOLOGIC EXAMINATION: All nails are thick dystrophic yellow nails x 10.  No open lesions or abnormal lesions noted.  VASCULAR EXAMINATION OF LOWER LIMBS: Pedal pulses: All pedal pulses are palpable with normal pulsation.  Capillary Filling times within 3 seconds in all digits.  No edema or erythema, no ischemic signs noted. Temperature gradient from tibial crest to dorsum of foot is within normal bilateral.  NEUROLOGIC EXAMINATION OF THE LOWER LIMBS: Achilles DTR is present and within normal. Fail to respond to Monofilament (Semmes-Weinstein 10-gm) sensory testing both feet. Decreased response to Vibratory sensations(128Hz  turning fork)  bilateral.  MUSCULOSKELETAL EXAMINATION: Positive for collapsed midtarsal bone (Talonavicular complex) bilateral. Flattened mid arch bilateral. Excess abduction and pronation of Subtalar joint bilateral. Excess ankle joint abduction with weight bearing bilateral.   ASSESSMENT: Diabetic neuropathy bilateral. Subluxed mid tarsal joint (Talonavicular complex) bilateral. Excess pronation STJ and ankle joint bilateral. Onychomycosis bilateral. Pain in lower  limb. High risk of fall.  PLAN: Discussed clinical findings and available treatment options; palliation, ankle support, regular check up, maintain normal blood sugar, nerve vitamins etc,. Discussed need for fall prevention program. Patient did not want to try [facility] Brace at this time. Patient requested diabetic shoes instead. All nails debrided. Necessary paper work dispensed for diabetic shoes.

## 2015-01-01 NOTE — Patient Instructions (Signed)
Seen for diabetic foot. Noted of peripheral neuropathy with high risk of fall.  Recommended high top well supported diabetic shoes. All nails debrided. Return in 3 months for routine foot care.

## 2015-04-02 ENCOUNTER — Ambulatory Visit (INDEPENDENT_AMBULATORY_CARE_PROVIDER_SITE_OTHER): Payer: Medicare Other | Admitting: Podiatry

## 2015-04-02 ENCOUNTER — Encounter: Payer: Self-pay | Admitting: Podiatry

## 2015-04-02 VITALS — BP 139/77 | HR 72 | Ht 68.0 in | Wt 170.0 lb

## 2015-04-02 DIAGNOSIS — M79606 Pain in leg, unspecified: Secondary | ICD-10-CM | POA: Diagnosis not present

## 2015-04-02 DIAGNOSIS — B351 Tinea unguium: Secondary | ICD-10-CM

## 2015-04-02 DIAGNOSIS — E0842 Diabetes mellitus due to underlying condition with diabetic polyneuropathy: Secondary | ICD-10-CM | POA: Diagnosis not present

## 2015-04-02 NOTE — Progress Notes (Signed)
SUBJECTIVE: 81 y.o. year old female presents requesting toe nails trimmed. Patient is ambulatory without assistance.  Denies any change since the last visit.  OBJECTIVE: DERMATOLOGIC EXAMINATION: All nails are thick dystrophic yellow nails x 10.  No open lesions or abnormal lesions noted.  VASCULAR EXAMINATION OF LOWER LIMBS: Pedal pulses: All pedal pulses are palpable with normal pulsation. Capillary Filling times within 3 seconds in all digits.  No edema or erythema, no ischemic signs noted. Temperature gradient from tibial crest to dorsum of foot is within normal bilateral. NEUROLOGIC EXAMINATION OF THE LOWER LIMBS: All epicritic and tactile sensations grossly intact.  MUSCULOSKELETAL EXAMINATION: Positive for collapsed midtarsal bone (Talonavicular complex) bilateral. Flattened mid arch bilateral. Excess abduction and pronation of Subtalar joint bilateral. Excess ankle joint abduction with weight bearing bilateral.   ASSESSMENT: Diabetic neuropathy bilateral. Subluxed mid tarsal joint (Talonavicular complex) bilateral. Excess pronation STJ and ankle joint bilateral. Onychomycosis bilateral. Pain in lower limb. High risk of fall.  PLAN: All nails debrided. Return in 3 months.

## 2015-04-02 NOTE — Patient Instructions (Signed)
Seen for hypertrophic nails. All nails debrided. Return in 3 months or as needed.  

## 2015-05-08 DIAGNOSIS — M25519 Pain in unspecified shoulder: Secondary | ICD-10-CM | POA: Diagnosis not present

## 2015-05-08 DIAGNOSIS — N08 Glomerular disorders in diseases classified elsewhere: Secondary | ICD-10-CM | POA: Diagnosis not present

## 2015-05-08 DIAGNOSIS — Z0000 Encounter for general adult medical examination without abnormal findings: Secondary | ICD-10-CM | POA: Diagnosis not present

## 2015-05-08 DIAGNOSIS — I129 Hypertensive chronic kidney disease with stage 1 through stage 4 chronic kidney disease, or unspecified chronic kidney disease: Secondary | ICD-10-CM | POA: Diagnosis not present

## 2015-05-08 DIAGNOSIS — E1122 Type 2 diabetes mellitus with diabetic chronic kidney disease: Secondary | ICD-10-CM | POA: Diagnosis not present

## 2015-05-08 DIAGNOSIS — N182 Chronic kidney disease, stage 2 (mild): Secondary | ICD-10-CM | POA: Diagnosis not present

## 2015-05-08 DIAGNOSIS — E559 Vitamin D deficiency, unspecified: Secondary | ICD-10-CM | POA: Diagnosis not present

## 2015-06-20 ENCOUNTER — Emergency Department (HOSPITAL_BASED_OUTPATIENT_CLINIC_OR_DEPARTMENT_OTHER): Payer: Medicare Other

## 2015-06-20 ENCOUNTER — Encounter (HOSPITAL_BASED_OUTPATIENT_CLINIC_OR_DEPARTMENT_OTHER): Payer: Self-pay | Admitting: *Deleted

## 2015-06-20 ENCOUNTER — Emergency Department (HOSPITAL_BASED_OUTPATIENT_CLINIC_OR_DEPARTMENT_OTHER)
Admission: EM | Admit: 2015-06-20 | Discharge: 2015-06-20 | Disposition: A | Discharge status: Home / Self Care | Payer: Medicare Other | Attending: Emergency Medicine | Admitting: Emergency Medicine

## 2015-06-20 DIAGNOSIS — E119 Type 2 diabetes mellitus without complications: Secondary | ICD-10-CM | POA: Insufficient documentation

## 2015-06-20 DIAGNOSIS — M1611 Unilateral primary osteoarthritis, right hip: Secondary | ICD-10-CM | POA: Diagnosis not present

## 2015-06-20 DIAGNOSIS — M549 Dorsalgia, unspecified: Secondary | ICD-10-CM | POA: Insufficient documentation

## 2015-06-20 DIAGNOSIS — I10 Essential (primary) hypertension: Secondary | ICD-10-CM | POA: Insufficient documentation

## 2015-06-20 DIAGNOSIS — M25551 Pain in right hip: Secondary | ICD-10-CM | POA: Insufficient documentation

## 2015-06-20 DIAGNOSIS — Z880 Allergy status to penicillin: Secondary | ICD-10-CM | POA: Insufficient documentation

## 2015-06-20 DIAGNOSIS — Z79899 Other long term (current) drug therapy: Secondary | ICD-10-CM | POA: Insufficient documentation

## 2015-06-20 DIAGNOSIS — M25559 Pain in unspecified hip: Secondary | ICD-10-CM

## 2015-06-20 MED ORDER — NAPROXEN 375 MG PO TABS: 375.0000 mg | ORAL_TABLET | Freq: Two times a day (BID) | ORAL | Status: DC

## 2015-06-20 MED ORDER — ACETAMINOPHEN 500 MG PO TABS: 1000.0000 mg | ORAL_TABLET | Freq: Four times a day (QID) | ORAL | Status: DC | PRN

## 2015-06-20 MED ORDER — LIDOCAINE 5 % EX PTCH: 1.0000 | MEDICATED_PATCH | CUTANEOUS | Status: DC

## 2015-06-20 NOTE — ED Notes (Signed)
Pt amb to room 10 with quick steady gait in nad. Pt reports right hip pain x 2 days, denies any injury or trauma, pt states she sits in a recliner "a lot" and wonders if that is related to her pain.

## 2015-06-20 NOTE — ED Notes (Signed)
Patient transported to X-ray 

## 2015-06-20 NOTE — ED Provider Notes (Signed)
CSN: [id number]     Arrival date & time 06/20/15  7425 History   First MD Initiated Contact with Patient 06/20/15 [phone]     Chief Complaint  Patient presents with  . Hip Pain     (Consider location/radiation/quality/duration/timing/severity/associated sxs/prior Treatment) Patient is a 81 y.o. female presenting with hip pain. The history is provided by the patient.  Hip Pain This is a new problem. Episode onset: 3-4 days. The problem occurs constantly. The problem has not changed (waxing and waning in severity) since onset.Pertinent negatives include no abdominal pain. Associated symptoms comments: No leg swelling, numbness, weakness.  No trauma or falls.  Normal bowel and bladder habits. The symptoms are aggravated by walking (prolonged standing). Relieved by: improved with sitting and lying down for awhile and then will get uncomfortable in those positions. She has tried acetaminophen for the symptoms. The treatment provided no relief.    Past Medical History  Diagnosis Date  . Diabetes mellitus without complication   . Hypertension    Past Surgical History  Procedure Laterality Date  . Knee surgery     History reviewed. No pertinent family history. History  Substance Use Topics  . Smoking status: Never Smoker   . Smokeless tobacco: Never Used  . Alcohol Use: No   OB History    No data available     Review of Systems  Gastrointestinal: Negative for abdominal pain.  All other systems reviewed and are negative.     Allergies  Penicillins  Home Medications   Prior to Admission medications   Medication Sig Start Date End Date Taking? Authorizing Provider  Cholecalciferol (VITAMIN D3) 2000 UNITS capsule 1 qd    Historical Provider, MD  glucose blood (ACCU-CHEK SMARTVIEW) test strip CHECK BLOOD SUGAR ONCE BEFORE BREAKFAST AND DINNER 10/14/14   Historical Provider, MD  Lancets Misc. (ACCU-CHEK FASTCLIX LANCET) KIT USE TO CHECK BS DAILY, DX: 250.02 06/25/13   Historical  Provider, MD  lisinopril-hydrochlorothiazide (PRINZIDE,ZESTORETIC) 10-12.5 MG per tablet Take 1 tablet by mouth daily.    Historical Provider, MD  metFORMIN (GLUCOPHAGE-XR) 500 MG 24 hr tablet TAKE ONE TABLET BY MOUTH EVERY DAY WITH DINNER. 10/04/14   Historical Provider, MD  Rosuvastatin Calcium (CRESTOR PO) Take by mouth.    Historical Provider, MD   BP 135/90 mmHg  Pulse 86  Temp(Src) 97.8 F (36.6 C) (Oral)  Resp 18  Ht $R'5\' 7"'Ti$  (1.702 m)  Wt 170 lb (77.111 kg)  BMI 26.62 kg/m2  SpO2 96% Physical Exam  Constitutional: She is oriented to person, place, and time. She appears well-developed and well-nourished. No distress.  HENT:  Head: Normocephalic and atraumatic.  Cardiovascular: Normal rate and intact distal pulses.   Pulmonary/Chest: Effort normal.  Musculoskeletal:       Right hip: She exhibits tenderness. She exhibits normal range of motion, normal strength, no swelling, no crepitus and no deformity.       Right knee: Normal.       Lumbar back: She exhibits no bony tenderness.       Back:       Legs: Neurological: She is alert and oriented to person, place, and time. She has normal strength. No sensory deficit.  Skin: Skin is warm and dry. No rash noted. No erythema.  Psychiatric: She has a normal mood and affect. Her behavior is normal.  Nursing note and vitals reviewed.   ED Course  Procedures (including critical care time) Labs Review Labs Reviewed - No data to display  Imaging  Review Dg Hip Unilat With Pelvis Min 4 Views Right  06/20/2015   CLINICAL DATA:  Pain for past several weeks, recently progressed. No history of recent trauma.  EXAM: RIGHT HIP (WITH PELVIS) 3 VIEWS  COMPARISON:  None.  FINDINGS: Frontal pelvis as well as frontal and lateral right hip images obtained. There is moderate narrowing of both hip joints. No fracture or dislocation. No erosive change. Bones are osteoporotic. There are multiple pelvic phleboliths.  IMPRESSION: Osteoarthritic change and  osteoporosis.  No fracture or dislocation.   Electronically Signed   By: Lowella Grip III M.D.   On: 06/20/2015 10:29     EKG Interpretation None      MDM   Final diagnoses:  Hip pain    Patient here with 3 days of right-sided back and hip pain radiating to the knee. Patient denies any trauma or identifiable injury. No prior symptoms similar to this. Pain is worse with extended amounts of standing and walking. Patient took Tylenol one time 3 days ago but has not taken anything else for the pain. She denies any swelling, redness or weakness to the leg. Normal bowel and bladder habits.  On exam patient has point tenderness to the right paralumbar and posterior hip region. She has minimal pain with range of motion of the hip and normal sensation. No rashes or evidence of zoster. Low suspicion for septic hip.  Feel this could be lumbar radiculopathy versus osteoarthritis of the hip. Hip films pending  10:39 AM Hip film shows osteoarthritis and discussed with patient that this could still be hip or back related. Will try a short course of naproxen, Tylenol and Lidoderm patches. Patient will follow-up with Dr. Baird Cancer if symptoms are not improving in 1-2 weeks.  Blanchie Dessert, MD 06/20/15 1042

## 2015-06-20 NOTE — ED Notes (Signed)
MD at bedside. 

## 2015-07-02 ENCOUNTER — Encounter: Payer: Self-pay | Admitting: Podiatry

## 2015-07-02 ENCOUNTER — Ambulatory Visit (INDEPENDENT_AMBULATORY_CARE_PROVIDER_SITE_OTHER): Payer: Medicare Other | Admitting: Podiatry

## 2015-07-02 VITALS — BP 156/80 | HR 81 | Ht 68.0 in | Wt 172.0 lb

## 2015-07-02 DIAGNOSIS — B351 Tinea unguium: Secondary | ICD-10-CM

## 2015-07-02 DIAGNOSIS — S93306D Unspecified dislocation of unspecified foot, subsequent encounter: Secondary | ICD-10-CM

## 2015-07-02 DIAGNOSIS — M79606 Pain in leg, unspecified: Secondary | ICD-10-CM

## 2015-07-02 DIAGNOSIS — E0842 Diabetes mellitus due to underlying condition with diabetic polyneuropathy: Secondary | ICD-10-CM | POA: Diagnosis not present

## 2015-07-02 DIAGNOSIS — M216X9 Other acquired deformities of unspecified foot: Secondary | ICD-10-CM | POA: Diagnosis not present

## 2015-07-02 DIAGNOSIS — M79673 Pain in unspecified foot: Secondary | ICD-10-CM | POA: Diagnosis not present

## 2015-07-02 DIAGNOSIS — S93303D Unspecified subluxation of unspecified foot, subsequent encounter: Secondary | ICD-10-CM

## 2015-07-02 NOTE — Patient Instructions (Signed)
Seen for hypertrophic nails. All nails debrided. Both feet measured for diabetic shoes. Return in 3 months or as needed.  

## 2015-07-02 NOTE — Progress Notes (Signed)
SUBJECTIVE: 81 y.o. year old female presents requesting toe nails trimmed and request for diabetic shoes. Patient is ambulatory without assistance and accompanied by her daughter. Denies any change since the last visit.  OBJECTIVE: DERMATOLOGIC EXAMINATION: All nails are thick dystrophic yellow nails x 10.  No open lesions or abnormal lesions noted.  VASCULAR EXAMINATION OF LOWER LIMBS: Pedal pulses: All pedal pulses are palpable with normal pulsation. Capillary Filling times within 3 seconds in all digits.  No edema or erythema, no ischemic signs noted. Temperature gradient from tibial crest to dorsum of foot is within normal bilateral. NEUROLOGIC EXAMINATION OF THE LOWER LIMBS: All epicritic and tactile sensations grossly intact.  MUSCULOSKELETAL EXAMINATION: Positive for collapsed midtarsal bone (Talonavicular complex) bilateral. Flattened mid arch bilateral. Excess abduction and pronation of Subtalar joint bilateral. Excess ankle joint abduction with weight bearing bilateral.   ASSESSMENT: Diabetic neuropathy bilateral. Subluxed mid tarsal joint (Talonavicular complex) bilateral. Excess pronation STJ and ankle joint bilateral. Onychomycosis bilateral. Pain in lower limb. High risk of fall.  PLAN: All nails debrided. Both feet measured for diabetic shoes. Return in 3 months

## 2015-07-21 DIAGNOSIS — H26493 Other secondary cataract, bilateral: Secondary | ICD-10-CM | POA: Diagnosis not present

## 2015-07-21 DIAGNOSIS — E119 Type 2 diabetes mellitus without complications: Secondary | ICD-10-CM | POA: Diagnosis not present

## 2015-07-21 DIAGNOSIS — H43813 Vitreous degeneration, bilateral: Secondary | ICD-10-CM | POA: Diagnosis not present

## 2015-07-21 DIAGNOSIS — H524 Presbyopia: Secondary | ICD-10-CM | POA: Diagnosis not present

## 2015-08-27 DIAGNOSIS — E1122 Type 2 diabetes mellitus with diabetic chronic kidney disease: Secondary | ICD-10-CM | POA: Diagnosis not present

## 2015-08-27 DIAGNOSIS — M25552 Pain in left hip: Secondary | ICD-10-CM | POA: Diagnosis not present

## 2015-08-27 DIAGNOSIS — N182 Chronic kidney disease, stage 2 (mild): Secondary | ICD-10-CM | POA: Diagnosis not present

## 2015-08-27 DIAGNOSIS — R110 Nausea: Secondary | ICD-10-CM | POA: Diagnosis not present

## 2015-08-27 DIAGNOSIS — N08 Glomerular disorders in diseases classified elsewhere: Secondary | ICD-10-CM | POA: Diagnosis not present

## 2015-09-04 DIAGNOSIS — M5432 Sciatica, left side: Secondary | ICD-10-CM | POA: Diagnosis not present

## 2015-09-05 DIAGNOSIS — M5442 Lumbago with sciatica, left side: Secondary | ICD-10-CM | POA: Diagnosis not present

## 2015-09-24 DIAGNOSIS — M533 Sacrococcygeal disorders, not elsewhere classified: Secondary | ICD-10-CM | POA: Diagnosis not present

## 2015-10-01 ENCOUNTER — Encounter: Payer: Self-pay | Admitting: Podiatry

## 2015-10-01 ENCOUNTER — Ambulatory Visit (INDEPENDENT_AMBULATORY_CARE_PROVIDER_SITE_OTHER): Payer: Medicare Other | Admitting: Podiatry

## 2015-10-01 VITALS — BP 141/77 | HR 73

## 2015-10-01 DIAGNOSIS — M79606 Pain in leg, unspecified: Secondary | ICD-10-CM | POA: Diagnosis not present

## 2015-10-01 DIAGNOSIS — Z23 Encounter for immunization: Secondary | ICD-10-CM | POA: Diagnosis not present

## 2015-10-01 DIAGNOSIS — B351 Tinea unguium: Secondary | ICD-10-CM | POA: Diagnosis not present

## 2015-10-01 NOTE — Progress Notes (Signed)
SUBJECTIVE: 81 y.o. year old female presents requesting toe nails trimmed. Right great toe hurts in shoes and at night even with a bed cover.   She is also picking up her diabetic shoes. Stated that she has arthritic pain in her back, hips and knees. She just had an injection on her back.   OBJECTIVE: DERMATOLOGIC EXAMINATION: All nails are thick dystrophic yellow nails x 10.  Thick ingrown nail right medial border with pain.  VASCULAR EXAMINATION OF LOWER LIMBS: Pedal pulses: All pedal pulses are palpable with normal pulsation. Capillary Filling times within 3 seconds in all digits.  No edema or erythema, no ischemic signs noted. Temperature gradient from tibial crest to dorsum of foot is within normal bilateral. NEUROLOGIC EXAMINATION OF THE LOWER LIMBS: All epicritic and tactile sensations grossly intact.  MUSCULOSKELETAL EXAMINATION: Positive for collapsed midtarsal bone (Talonavicular complex) bilateral. Flattened mid arch bilateral. Excess abduction and pronation of Subtalar joint bilateral. Excess ankle joint abduction with weight bearing bilateral.   ASSESSMENT: Diabetic neuropathy bilateral. Subluxed mid tarsal joint (Talonavicular complex) bilateral. Excess pronation STJ and ankle joint bilateral. Onychomycosis bilateral. Ingrown nail right medial border. Pain in lower limb. High risk of fall.  PLAN: All nails debrided. Removed ingrown nail right medial border with an offer to have the ingrown nail permanently removed.  Fitted diabetic shoe with instruction and dispensed x 2 pair.  Return in 3 months

## 2015-10-01 NOTE — Patient Instructions (Addendum)
Seen for hypertrophic nails. All nails debrided. Diabetic shoes dispensed. Return in 3 months or as needed.  

## 2015-10-03 ENCOUNTER — Ambulatory Visit: Payer: Medicare Other | Admitting: Podiatry

## 2015-10-07 DIAGNOSIS — M5442 Lumbago with sciatica, left side: Secondary | ICD-10-CM | POA: Diagnosis not present

## 2015-10-21 DIAGNOSIS — M5442 Lumbago with sciatica, left side: Secondary | ICD-10-CM | POA: Diagnosis not present

## 2015-10-30 DIAGNOSIS — M5442 Lumbago with sciatica, left side: Secondary | ICD-10-CM | POA: Diagnosis not present

## 2015-11-24 DIAGNOSIS — L821 Other seborrheic keratosis: Secondary | ICD-10-CM | POA: Diagnosis not present

## 2015-11-27 DIAGNOSIS — N182 Chronic kidney disease, stage 2 (mild): Secondary | ICD-10-CM | POA: Diagnosis not present

## 2015-11-27 DIAGNOSIS — N08 Glomerular disorders in diseases classified elsewhere: Secondary | ICD-10-CM | POA: Diagnosis not present

## 2015-11-27 DIAGNOSIS — E1122 Type 2 diabetes mellitus with diabetic chronic kidney disease: Secondary | ICD-10-CM | POA: Diagnosis not present

## 2015-11-27 DIAGNOSIS — I129 Hypertensive chronic kidney disease with stage 1 through stage 4 chronic kidney disease, or unspecified chronic kidney disease: Secondary | ICD-10-CM | POA: Diagnosis not present

## 2015-12-23 DIAGNOSIS — M5441 Lumbago with sciatica, right side: Secondary | ICD-10-CM | POA: Diagnosis not present

## 2015-12-23 DIAGNOSIS — M533 Sacrococcygeal disorders, not elsewhere classified: Secondary | ICD-10-CM | POA: Diagnosis not present

## 2015-12-25 DIAGNOSIS — M533 Sacrococcygeal disorders, not elsewhere classified: Secondary | ICD-10-CM | POA: Diagnosis not present

## 2016-01-06 ENCOUNTER — Ambulatory Visit: Payer: Medicare Other | Admitting: Podiatry

## 2016-01-08 DIAGNOSIS — M5441 Lumbago with sciatica, right side: Secondary | ICD-10-CM | POA: Diagnosis not present

## 2016-01-20 DIAGNOSIS — M5441 Lumbago with sciatica, right side: Secondary | ICD-10-CM | POA: Diagnosis not present

## 2016-02-03 DIAGNOSIS — M5441 Lumbago with sciatica, right side: Secondary | ICD-10-CM | POA: Diagnosis not present

## 2016-02-03 DIAGNOSIS — M533 Sacrococcygeal disorders, not elsewhere classified: Secondary | ICD-10-CM | POA: Diagnosis not present

## 2016-02-12 ENCOUNTER — Ambulatory Visit (INDEPENDENT_AMBULATORY_CARE_PROVIDER_SITE_OTHER): Payer: Medicare Other | Admitting: Podiatry

## 2016-02-12 ENCOUNTER — Encounter: Payer: Self-pay | Admitting: Podiatry

## 2016-02-12 VITALS — BP 150/90 | HR 98

## 2016-02-12 DIAGNOSIS — B351 Tinea unguium: Secondary | ICD-10-CM

## 2016-02-12 DIAGNOSIS — M79606 Pain in leg, unspecified: Secondary | ICD-10-CM | POA: Diagnosis not present

## 2016-02-12 NOTE — Patient Instructions (Signed)
Seen for hypertrophic nails. All nails debrided. Return in 3 months or as needed.  

## 2016-02-12 NOTE — Progress Notes (Signed)
SUBJECTIVE: 82 y.o. year old female presents requesting toe nails trimmed. She fell and bruised right great toe.  OBJECTIVE: DERMATOLOGIC EXAMINATION: All nails are thick dystrophic yellow nails x 10.  Thick ingrown nail right medial border with pain.  VASCULAR EXAMINATION OF LOWER LIMBS: Pedal pulses: All pedal pulses are palpable with normal pulsation. Capillary Filling times within 3 seconds in all digits.  No edema or erythema, no ischemic signs noted. Temperature gradient from tibial crest to dorsum of foot is within normal bilateral. NEUROLOGIC EXAMINATION OF THE LOWER LIMBS: All epicritic and tactile sensations grossly intact.  MUSCULOSKELETAL EXAMINATION: Positive for collapsed midtarsal bone (Talonavicular complex) bilateral. Flattened mid arch bilateral. Excess abduction and pronation of Subtalar joint bilateral. Excess ankle joint abduction with weight bearing bilateral.   ASSESSMENT: Diabetic neuropathy bilateral. Subluxed mid tarsal joint (Talonavicular complex) bilateral. Excess pronation STJ and ankle joint bilateral. Onychomycosis bilateral. Ingrown nail right medial border. Pain in lower limb. High risk of fall.  PLAN: All nails debrided. Return in 3 months

## 2016-02-18 DIAGNOSIS — M47816 Spondylosis without myelopathy or radiculopathy, lumbar region: Secondary | ICD-10-CM | POA: Diagnosis not present

## 2016-03-10 DIAGNOSIS — M5441 Lumbago with sciatica, right side: Secondary | ICD-10-CM | POA: Diagnosis not present

## 2016-03-10 DIAGNOSIS — M5136 Other intervertebral disc degeneration, lumbar region: Secondary | ICD-10-CM | POA: Diagnosis not present

## 2016-03-18 DIAGNOSIS — N182 Chronic kidney disease, stage 2 (mild): Secondary | ICD-10-CM | POA: Diagnosis not present

## 2016-03-18 DIAGNOSIS — E1122 Type 2 diabetes mellitus with diabetic chronic kidney disease: Secondary | ICD-10-CM | POA: Diagnosis not present

## 2016-03-18 DIAGNOSIS — I129 Hypertensive chronic kidney disease with stage 1 through stage 4 chronic kidney disease, or unspecified chronic kidney disease: Secondary | ICD-10-CM | POA: Diagnosis not present

## 2016-03-18 DIAGNOSIS — N08 Glomerular disorders in diseases classified elsewhere: Secondary | ICD-10-CM | POA: Diagnosis not present

## 2016-03-23 DIAGNOSIS — M5136 Other intervertebral disc degeneration, lumbar region: Secondary | ICD-10-CM | POA: Diagnosis not present

## 2016-04-06 DIAGNOSIS — M5136 Other intervertebral disc degeneration, lumbar region: Secondary | ICD-10-CM | POA: Diagnosis not present

## 2016-04-06 DIAGNOSIS — M5441 Lumbago with sciatica, right side: Secondary | ICD-10-CM | POA: Diagnosis not present

## 2016-04-12 ENCOUNTER — Emergency Department (HOSPITAL_BASED_OUTPATIENT_CLINIC_OR_DEPARTMENT_OTHER)
Admission: EM | Admit: 2016-04-12 | Discharge: 2016-04-13 | Disposition: A | Discharge status: Home / Self Care | Payer: Medicare Other | Attending: Emergency Medicine | Admitting: Emergency Medicine

## 2016-04-12 ENCOUNTER — Encounter (HOSPITAL_BASED_OUTPATIENT_CLINIC_OR_DEPARTMENT_OTHER): Payer: Self-pay

## 2016-04-12 DIAGNOSIS — E119 Type 2 diabetes mellitus without complications: Secondary | ICD-10-CM | POA: Diagnosis not present

## 2016-04-12 DIAGNOSIS — G8929 Other chronic pain: Secondary | ICD-10-CM | POA: Insufficient documentation

## 2016-04-12 DIAGNOSIS — M545 Low back pain: Secondary | ICD-10-CM | POA: Insufficient documentation

## 2016-04-12 DIAGNOSIS — Z79899 Other long term (current) drug therapy: Secondary | ICD-10-CM | POA: Insufficient documentation

## 2016-04-12 DIAGNOSIS — I10 Essential (primary) hypertension: Secondary | ICD-10-CM | POA: Diagnosis not present

## 2016-04-12 DIAGNOSIS — K5641 Fecal impaction: Secondary | ICD-10-CM | POA: Diagnosis not present

## 2016-04-12 DIAGNOSIS — T402X5A Adverse effect of other opioids, initial encounter: Secondary | ICD-10-CM | POA: Diagnosis not present

## 2016-04-12 DIAGNOSIS — K5900 Constipation, unspecified: Secondary | ICD-10-CM | POA: Diagnosis present

## 2016-04-12 DIAGNOSIS — K5903 Drug induced constipation: Secondary | ICD-10-CM | POA: Insufficient documentation

## 2016-04-12 HISTORY — DX: Other intervertebral disc degeneration, lumbar region: M51.36

## 2016-04-12 HISTORY — DX: Other intervertebral disc degeneration, lumbar region without mention of lumbar back pain or lower extremity pain: M51.369

## 2016-04-12 NOTE — ED Notes (Signed)
Pt reports constipation from taking narcotics from chronic back pain.  Reports took a stool softener without relief.  States she took 4 stool softeners today.  Last normal bm 3 days ago.  Denies n/v.

## 2016-04-12 NOTE — ED Notes (Signed)
MD at bedside. 

## 2016-04-12 NOTE — ED Notes (Signed)
Pt put out large amount of soft stool after enema. Pt reports feeling much better.

## 2016-04-12 NOTE — ED Provider Notes (Signed)
CSN: [id number]     Arrival date & time 04/12/16  2045 History  By signing my name below, I, Doran Stabler, attest that this documentation has been prepared under the direction and in the presence of Shanon Rosser, MD. Electronically Signed: Doran Stabler, ED Scribe. 04/12/2016. 10:59 PM.   Chief Complaint  Patient presents with  . Constipation   The history is provided by the patient. No language interpreter was used.   HPI Comments: Molly Meyer is a 82 y.o. female who presents to the Emergency Department with a PMHx of DM and HTN complaining of constipation. Pt also reports nausea and chronic lower right sided back pain. Pt states she has been taking 1 Tramadol a day for her back pain for the past 1 week and suspects she is constipated from it. She also took 1/2 Percocet yesterday and 4 stool softeners today. She denies abdominal pain but is having rectal discomfort which she describes as the need to empty her bowels. Pts last BM was 3 days ago. She usually moves her bowels every other day.   Past Medical History  Diagnosis Date  . Diabetes mellitus without complication ([place])   . Hypertension   . Degenerative disc disease, lumbar    Past Surgical History  Procedure Laterality Date  . Knee surgery     No family history on file. Social History  Substance Use Topics  . Smoking status: Never Smoker   . Smokeless tobacco: Never Used  . Alcohol Use: No   OB History    No data available     Review of Systems A complete 10 system review of systems was obtained and all systems are negative except as noted in the HPI and PMH.   Allergies  Penicillins  Home Medications   Prior to Admission medications   Medication Sig Start Date End Date Taking? Authorizing Provider  acetaminophen (TYLENOL) 500 MG tablet Take 2 tablets (1,000 mg total) by mouth every 6 (six) hours as needed for mild pain or moderate pain. 06/20/15   Blanchie Dessert, MD  Cholecalciferol (VITAMIN D3) 2000 UNITS  capsule 1 qd    Historical Provider, MD  glucose blood (ACCU-CHEK SMARTVIEW) test strip CHECK BLOOD SUGAR ONCE BEFORE BREAKFAST AND DINNER 10/14/14   Historical Provider, MD  Lancets Misc. (ACCU-CHEK FASTCLIX LANCET) KIT USE TO CHECK BS DAILY, DX: 250.02 06/25/13   Historical Provider, MD  lidocaine (LIDODERM) 5 % Place 1 patch onto the skin daily. Remove & Discard patch within 12 hours or as directed by MD 06/20/15   Blanchie Dessert, MD  lisinopril-hydrochlorothiazide (PRINZIDE,ZESTORETIC) 10-12.5 MG per tablet Take 1 tablet by mouth daily.    Historical Provider, MD  metFORMIN (GLUCOPHAGE-XR) 500 MG 24 hr tablet TAKE ONE TABLET BY MOUTH EVERY DAY WITH DINNER. 10/04/14   Historical Provider, MD  naproxen (NAPROSYN) 375 MG tablet Take 1 tablet (375 mg total) by mouth 2 (two) times daily with a meal. 06/20/15   Blanchie Dessert, MD  oxyCODONE-acetaminophen (PERCOCET/ROXICET) 5-325 MG tablet  01/08/16   Historical Provider, MD  pravastatin (PRAVACHOL) 40 MG tablet  01/08/16   Historical Provider, MD   BP 164/106 mmHg  Pulse 109  Temp(Src) 97.9 F (36.6 C) (Oral)  Resp 22  Ht 5' 7.5" (1.715 m)  Wt 172 lb (78.019 kg)  BMI 26.53 kg/m2  SpO2 96%   Physical Exam General: Well-developed, well-nourished female in no acute distress; appearance consistent with age of record HENT: normocephalic; atraumatic Eyes: pupils equal, round and reactive  to light; extraocular muscles intact; lens implants Neck: supple Heart: regular rate and rhythm Lungs: clear to auscultation bilaterally Abdomen: soft; nondistended; nontender; no masses or hepatosplenomegaly; bowel sounds present Rectal: fecal impaction Extremities: No deformity; full range of motion; pulses normal, trace edema of lower legs Neurologic: Awake, alert and oriented; motor function intact in all extremities and symmetric; no facial droop Skin: Warm and dry Psychiatric: Normal mood and affect   ED Course  Procedures   DIAGNOSTIC  STUDIES: Oxygen Saturation is 96% on room air, normal by my interpretation.    DISIMPACTION Multiple masses of heart feces were manually removed from the patient's rectum. This is followed by a soapsuds enema which gave the patient significant relief. She was advised to take MiraLAX daily as well as increase her fiber intake.   MDM   Final diagnoses:  Fecal impaction in rectum (HCC)  Constipation due to opioid therapy   I personally performed the services described in this documentation, which was scribed in my presence. The recorded information has been reviewed and is accurate.   Shanon Rosser, MD 04/12/16 [phone]

## 2016-04-14 ENCOUNTER — Emergency Department (HOSPITAL_COMMUNITY): Payer: Medicare Other

## 2016-04-14 ENCOUNTER — Encounter (HOSPITAL_COMMUNITY): Payer: Self-pay | Admitting: Emergency Medicine

## 2016-04-14 ENCOUNTER — Emergency Department (HOSPITAL_COMMUNITY)
Admission: EM | Admit: 2016-04-14 | Discharge: 2016-04-14 | Disposition: A | Discharge status: Home / Self Care | Payer: Medicare Other | Attending: Emergency Medicine | Admitting: Emergency Medicine

## 2016-04-14 DIAGNOSIS — R1084 Generalized abdominal pain: Secondary | ICD-10-CM | POA: Diagnosis not present

## 2016-04-14 DIAGNOSIS — R197 Diarrhea, unspecified: Secondary | ICD-10-CM

## 2016-04-14 DIAGNOSIS — I714 Abdominal aortic aneurysm, without rupture, unspecified: Secondary | ICD-10-CM

## 2016-04-14 DIAGNOSIS — E119 Type 2 diabetes mellitus without complications: Secondary | ICD-10-CM | POA: Insufficient documentation

## 2016-04-14 DIAGNOSIS — R110 Nausea: Secondary | ICD-10-CM | POA: Diagnosis not present

## 2016-04-14 DIAGNOSIS — I10 Essential (primary) hypertension: Secondary | ICD-10-CM | POA: Insufficient documentation

## 2016-04-14 DIAGNOSIS — Z79899 Other long term (current) drug therapy: Secondary | ICD-10-CM | POA: Insufficient documentation

## 2016-04-14 DIAGNOSIS — R630 Anorexia: Secondary | ICD-10-CM | POA: Insufficient documentation

## 2016-04-14 DIAGNOSIS — Z7984 Long term (current) use of oral hypoglycemic drugs: Secondary | ICD-10-CM | POA: Diagnosis not present

## 2016-04-14 DIAGNOSIS — Z8739 Personal history of other diseases of the musculoskeletal system and connective tissue: Secondary | ICD-10-CM | POA: Insufficient documentation

## 2016-04-14 DIAGNOSIS — Z880 Allergy status to penicillin: Secondary | ICD-10-CM | POA: Diagnosis not present

## 2016-04-14 DIAGNOSIS — F419 Anxiety disorder, unspecified: Secondary | ICD-10-CM | POA: Insufficient documentation

## 2016-04-14 LAB — COMPREHENSIVE METABOLIC PANEL
ALT: 10 U/L — ABNORMAL LOW (ref 14–54)
AST: 19 U/L (ref 15–41)
Albumin: 3.7 g/dL (ref 3.5–5.0)
Alkaline Phosphatase: 37 U/L — ABNORMAL LOW (ref 38–126)
Anion gap: 13 (ref 5–15)
BUN: 7 mg/dL (ref 6–20)
CO2: 20 mmol/L — ABNORMAL LOW (ref 22–32)
Calcium: 9.8 mg/dL (ref 8.9–10.3)
Chloride: 100 mmol/L — ABNORMAL LOW (ref 101–111)
Creatinine, Ser: 0.78 mg/dL (ref 0.44–1.00)
GFR calc Af Amer: >60 mL/min (ref >60)
GFR calc non Af Amer: >60 mL/min (ref >60)
Glucose, Bld: 151 mg/dL — ABNORMAL HIGH (ref 65–99)
Potassium: 3.8 mmol/L (ref 3.5–5.1)
Sodium: 133 mmol/L — ABNORMAL LOW (ref 135–145)
Total Bilirubin: 1.5 mg/dL — ABNORMAL HIGH (ref 0.3–1.2)
Total Protein: 6.6 g/dL (ref 6.5–8.1)

## 2016-04-14 LAB — CBC
HCT: 41.9 % (ref 36.0–46.0)
Hemoglobin: 14.7 g/dL (ref 12.0–15.0)
MCH: 30.3 pg (ref 26.0–34.0)
MCHC: 35.1 g/dL (ref 30.0–36.0)
MCV: 86.4 fL (ref 78.0–100.0)
Platelets: 190 10*3/uL (ref 150–400)
RBC: 4.85 MIL/uL (ref 3.87–5.11)
RDW: 13.3 % (ref 11.5–15.5)
WBC: 5.3 10*3/uL (ref 4.0–10.5)

## 2016-04-14 LAB — URINE MICROSCOPIC-ADD ON: RBC / HPF: NONE SEEN RBC/hpf (ref 0–5)

## 2016-04-14 LAB — URINALYSIS, ROUTINE W REFLEX MICROSCOPIC
Bilirubin Urine: NEGATIVE
Glucose, UA: NEGATIVE mg/dL
Hgb urine dipstick: NEGATIVE
Ketones, ur: NEGATIVE mg/dL
Nitrite: NEGATIVE
Protein, ur: NEGATIVE mg/dL
Specific Gravity, Urine: 1.018 (ref 1.005–1.030)
pH: 7 (ref 5.0–8.0)

## 2016-04-14 LAB — LIPASE, BLOOD: Lipase: 23 U/L (ref 11–51)

## 2016-04-14 MED ORDER — SODIUM CHLORIDE 0.9 % IV BOLUS (SEPSIS)
1000.0000 mL | Freq: Once | INTRAVENOUS | Status: AC
  Administered 2016-04-14: 1000 mL via INTRAVENOUS

## 2016-04-14 MED ORDER — IOPAMIDOL (ISOVUE-300) INJECTION 61%
INTRAVENOUS | Status: AC
  Filled 2016-04-14: qty 50

## 2016-04-14 MED ORDER — ACIDOPHILUS PROBIOTIC 10 MG PO TABS: 10.0000 mg | ORAL_TABLET | Freq: Three times a day (TID) | ORAL | Status: DC

## 2016-04-14 MED ORDER — IOPAMIDOL (ISOVUE-300) INJECTION 61%
INTRAVENOUS | Status: AC
  Administered 2016-04-14: 100 mL
  Filled 2016-04-14: qty 50

## 2016-04-14 NOTE — ED Provider Notes (Signed)
CSN: [id number]     Arrival date & time 04/14/16  4944 History   First MD Initiated Contact with Patient 04/14/16 1041     Chief Complaint  Patient presents with  . Abdominal Pain    Molly Meyer is a 82 y.o. female who presents to the ED complaining of generalized abdominal pain since yesterday with diarrhea. She reports one episode of diarrhea last night. She complains of Generalized abdominal pain that she rates at an 8 out of 10 currently. She reports she is just not feeling herself. Patient reports she has chronic ongoing right low back pain that radiates down her posterior leg and is seeing orthopedic surgery for this. She has been on a narcotic pain medication regimen for this. She reports she was seen in the emergency department last week for constipation and had a fecal impaction. She reports she was feeling better until yesterday. She reports that he stool softener and having an episode of diarrhea last night. She has had decreased appetite.  She has taken nothing for treatment of her pain today. She has no previous abdominal surgical history. She denies fevers, vomiting, nausea, urinary symptoms, loss of bladder control, loss of bowel control, numbness, weakness, coughing, chest pain, palpitations, or shortness of breath.  Patient is a 82 y.o. female presenting with abdominal pain. The history is provided by the patient and a friend. No language interpreter was used.  Abdominal Pain Associated symptoms: diarrhea   Associated symptoms: no chest pain, no chills, no constipation, no cough, no dysuria, no fever, no hematuria, no nausea, no shortness of breath, no sore throat and no vomiting     Past Medical History  Diagnosis Date  . Diabetes mellitus without complication ([place])   . Hypertension   . Degenerative disc disease, lumbar    Past Surgical History  Procedure Laterality Date  . Knee surgery     History reviewed. No pertinent family history. Social History  Substance Use  Topics  . Smoking status: Never Smoker   . Smokeless tobacco: Never Used  . Alcohol Use: No   OB History    No data available     Review of Systems  Constitutional: Positive for appetite change. Negative for fever and chills.  HENT: Negative for congestion and sore throat.   Eyes: Negative for visual disturbance.  Respiratory: Negative for cough and shortness of breath.   Cardiovascular: Negative for chest pain and palpitations.  Gastrointestinal: Positive for abdominal pain and diarrhea. Negative for nausea, vomiting, constipation, blood in stool and abdominal distention.  Genitourinary: Negative for dysuria, urgency, frequency, hematuria, decreased urine volume and difficulty urinating.  Musculoskeletal: Positive for back pain. Negative for neck pain.  Skin: Negative for rash.  Neurological: Negative for syncope, weakness, light-headedness, numbness and headaches.      Allergies  Penicillins  Home Medications   Prior to Admission medications   Medication Sig Start Date End Date Taking? Authorizing Provider  acetaminophen (TYLENOL) 500 MG tablet Take 2 tablets (1,000 mg total) by mouth every 6 (six) hours as needed for mild pain or moderate pain. 06/20/15  Yes Blanchie Dessert, MD  Cholecalciferol (VITAMIN D3) 2000 UNITS capsule Take 2,000 Units by mouth daily. 1 qd   Yes Historical Provider, MD  docusate sodium (COLACE) 100 MG capsule Take 100 mg by mouth daily as needed for mild constipation.   Yes Historical Provider, MD  lisinopril-hydrochlorothiazide (PRINZIDE,ZESTORETIC) 10-12.5 MG per tablet Take 1 tablet by mouth daily.   Yes Historical Provider, MD  metFORMIN (GLUCOPHAGE-XR) 500 MG 24 hr tablet TAKE ONE TABLET BY MOUTH EVERY DAY WITH DINNER. 10/04/14  Yes Historical Provider, MD  oxyCODONE-acetaminophen (PERCOCET/ROXICET) 5-325 MG tablet Take 1 tablet by mouth every 4 (four) hours as needed for moderate pain.  01/08/16  Yes Historical Provider, MD  polyethylene glycol  (MIRALAX / GLYCOLAX) packet Take 17 g by mouth daily as needed for mild constipation.   Yes Historical Provider, MD  pravastatin (PRAVACHOL) 40 MG tablet Take 40 mg by mouth daily.  01/08/16  Yes Historical Provider, MD  traMADol (ULTRAM) 50 MG tablet Take 50 mg by mouth every 6 (six) hours as needed for moderate pain.   Yes Historical Provider, MD  glucose blood (ACCU-CHEK SMARTVIEW) test strip CHECK BLOOD SUGAR ONCE BEFORE BREAKFAST AND DINNER 10/14/14   Historical Provider, MD  Lactobacillus (ACIDOPHILUS PROBIOTIC) 10 MG TABS Take 10 mg by mouth 3 (three) times daily. 04/14/16   Waynetta Pean, PA-C  Lancets Misc. (ACCU-CHEK FASTCLIX LANCET) KIT USE TO CHECK BS DAILY, DX: 250.02 06/25/13   Historical Provider, MD  lidocaine (LIDODERM) 5 % Place 1 patch onto the skin daily. Remove & Discard patch within 12 hours or as directed by MD Patient not taking: Reported on 04/14/2016 06/20/15   Blanchie Dessert, MD  naproxen (NAPROSYN) 375 MG tablet Take 1 tablet (375 mg total) by mouth 2 (two) times daily with a meal. Patient not taking: Reported on 04/14/2016 06/20/15   Blanchie Dessert, MD   BP 133/91 mmHg  Pulse 88  Temp(Src) 97.3 F (36.3 C) (Oral)  Resp 18  SpO2 94% Physical Exam  Constitutional: She is oriented to person, place, and time. She appears well-developed and well-nourished. No distress.  Nontoxic appearing.  HENT:  Head: Normocephalic and atraumatic.  Mouth/Throat: Oropharynx is clear and moist.  Eyes: Conjunctivae are normal. Pupils are equal, round, and reactive to light. Right eye exhibits no discharge. Left eye exhibits no discharge.  Neck: Neck supple.  Cardiovascular: Normal rate, regular rhythm, normal heart sounds and intact distal pulses.  Exam reveals no gallop and no friction rub.   No murmur heard. HR 88.   Pulmonary/Chest: Effort normal and breath sounds normal. No respiratory distress. She has no wheezes. She has no rales.  Lungs are clear to auscultation bilaterally.   Abdominal: Soft. Bowel sounds are normal. She exhibits no distension and no mass. There is tenderness. There is no rebound and no guarding.  Abdomen is soft. Bowel sounds are present. Patient has generalized abdominal tenderness to palpation without focal tenderness. No peritoneal signs.  Musculoskeletal: She exhibits no edema or tenderness.  No lower extremity edema or tenderness.  Lymphadenopathy:    She has no cervical adenopathy.  Neurological: She is alert and oriented to person, place, and time. Coordination normal.  Skin: Skin is warm and dry. No rash noted. She is not diaphoretic. No erythema. No pallor.  Psychiatric: Her behavior is normal. Her mood appears anxious.  Patient appears anxious.  Nursing note and vitals reviewed.   ED Course  Procedures (including critical care time) Labs Review Labs Reviewed  COMPREHENSIVE METABOLIC PANEL - Abnormal; Notable for the following:    Sodium 133 (*)    Chloride 100 (*)    CO2 20 (*)    Glucose, Bld 151 (*)    ALT 10 (*)    Alkaline Phosphatase 37 (*)    Total Bilirubin 1.5 (*)    All other components within normal limits  URINALYSIS, ROUTINE W REFLEX MICROSCOPIC (NOT AT  [facility]) - Abnormal; Notable for the following:    Leukocytes, UA SMALL (*)    All other components within normal limits  URINE MICROSCOPIC-ADD ON - Abnormal; Notable for the following:    Squamous Epithelial / LPF 0-5 (*)    Bacteria, UA RARE (*)    All other components within normal limits  URINE CULTURE  LIPASE, BLOOD  CBC    Imaging Review Ct Abdomen Pelvis W Contrast  04/14/2016  CLINICAL DATA:  Mid abdominal pain and diarrhea since last evening. EXAM: CT ABDOMEN AND PELVIS WITH CONTRAST TECHNIQUE: Multidetector CT imaging of the abdomen and pelvis was performed using the standard protocol following bolus administration of intravenous contrast. CONTRAST:  140m ISOVUE-300 IOPAMIDOL (ISOVUE-300) INJECTION 61% COMPARISON:  None. FINDINGS: Lower chest:  Bibasilar scarring changes and subsegmental atelectasis. No infiltrates or effusions. There is a 5 mm pulmonary nodule at the left lung base and image number 9 which will require followup. No other worrisome pulmonary lesions. Extensive 3 vessel coronary artery calcifications. The heart is normal in size. No pericardial effusion. Small hiatal hernia. Hepatobiliary: Numerous hepatic cysts but no worrisome hepatic lesions or intrahepatic biliary dilatation. The portal and hepatic veins are patent. The gallbladder is unremarkable. No common bile duct dilatation. Pancreas: No acute inflammatory process or ductal dilatation. Cystic change noted in the pancreatic body no obvious enhancement or nodularity. This could be a cluster of pancreatic cysts. Side-branch IPMNs are possible. Spleen: Normal size.  No focal lesions. Adrenals/Urinary Tract: The adrenal glands and kidneys are unremarkable except for a numerous simple right renal cyst. No renal calculi or hydronephrosis. No ureteral or bladder calculi. Stomach/Bowel: The stomach, duodenum, small bowel and colon are unremarkable. No inflammatory changes, mass lesions or obstructive findings. Moderate mid sigmoid diverticulosis without findings for acute diverticulitis. There is a small to moderate-sized duodenum diverticulum noted. The terminal ileum is normal. The appendix is normal except for a few small appendicoliths. Vascular/Lymphatic: No mesenteric or retroperitoneal mass or lymphadenopathy. Infrarenal abdominal aortic aneurysm extending down to right above the iliac artery bifurcation. Maximum measurements are 5.0 x 4.2 cm. Small amount of mural thrombus. Extensive atherosclerotic calcifications involving the aorta and branch vessels. The branch vessels are patent. No dissection. Moderate tortuosity of both iliac arteries. Reproductive: The uterus is normal in size for age. The endometrium is prominent measuring approximately 9 mm. Recommend correlation with  pelvic ultrasound examination. The ovaries are unremarkable. Other: No pelvic mass or adenopathy. No free pelvic fluid collections. No inguinal mass or lymphadenopathy. No abdominal wall hernia or subcutaneous lesions. Musculoskeletal: No significant bony findings. Scoliosis and degenerative lumbar spondylosis with multilevel disc disease and facet disease. Both hips are normally located. Moderate degenerative changes. IMPRESSION: 1. No acute abdominal/pelvic findings, mass lesions or lymphadenopathy. 2. 5 mm left lower lobe subpleural pulmonary nodule, likely benign. One year follow-up chest CT suggested. 3. Advanced atherosclerotic calcifications involving the aorta and branch vessels including dense 3 vessel coronary artery calcification spared 4. 5.0 x 4.2 cm infrarenal abdominal aortic aneurysm. Recommend followup by abdomen and pelvis CTA in 3-6 months, and vascular surgery referral/consultation if not already obtained. This recommendation follows ACR consensus guidelines: White Paper of the ACR Incidental Findings Committee II on Vascular Findings. J Am Coll Radiol 2013; 145:409-811 5. 2.8 x 2.1 cm cystic lesion in the pancreatic body. This is likely a benign finding and given the patient's age a routine followup pancreatic CT scan in 1 year is suggested. 6. Thickened endometrium for age. Recommend followup pelvic  ultrasound examination. Electronically Signed   By: Marijo Sanes M.D.   On: 04/14/2016 12:27   I have personally reviewed and evaluated these images and lab results as part of my medical decision-making.   EKG Interpretation   Date/Time:  Wednesday April 14 2016 11:24:11 EDT Ventricular Rate:  91 PR Interval:  184 QRS Duration: 86 QT Interval:  379 QTC Calculation: 466 R Axis:   4 Text Interpretation:  Sinus rhythm Low voltage, precordial leads No  significant change since last tracing Confirmed by KNAPP  MD-J, JON  ([id number]) on 04/14/2016 11:30:50 AM Also confirmed by KNAPP  MD-J, JON   [phone]), editor [place]  CCT, BEVERLY ([id number])  on 04/14/2016 12:13:15 PM      Filed Vitals:   04/14/16 1345 04/14/16 1415 04/14/16 1430 04/14/16 1500  BP: 147/89 139/83 141/84 133/91  Pulse: 89 85 95 88  Temp:      TempSrc:      Resp: _0 SpO2: 95% 96% 97% 94%     MDM   Meds given in ED:  Medications  iopamidol (ISOVUE-300) 61 % injection (not administered)  sodium chloride 0.9 % bolus 1,000 mL (0 mLs Intravenous Stopped 04/14/16 1255)  iopamidol (ISOVUE-300) 61 % injection (100 mLs  Contrast Given 04/14/16 1151)    New Prescriptions   LACTOBACILLUS (ACIDOPHILUS PROBIOTIC) 10 MG TABS    Take 10 mg by mouth 3 (three) times daily.    Final diagnoses:  Generalized abdominal pain  Nausea  Diarrhea, unspecified type  Abdominal aortic aneurysm (AAA) without rupture [facility])   This is a 82 y.o. female who presents to the ED complaining of generalized abdominal pain since yesterday with diarrhea. She reports one episode of diarrhea last night. She complains of Generalized abdominal pain that she rates at an 8 out of 10 currently. She reports she is just not feeling herself. Patient reports she has chronic ongoing right low back pain that radiates down her posterior leg and is seeing orthopedic surgery for this. She has been on a narcotic pain medication regimen for this. She reports she was seen in the emergency department last week for constipation and had a fecal impaction. She reports she was feeling better until yesterday. She reports that he stool softener and having an episode of diarrhea last night. She has had decreased appetite.  She has taken nothing for treatment of her pain today. She has no previous abdominal surgical history. On exam the patient is afebrile nontoxic appearing. She has generalized abdominal tenderness to palpation without focal tenderness. No peritoneal signs. Will obtain CT scan and provide a fluid bolus. She declines any pain medication at this  time. Lipase is within normal limits. CMP reveals a sodium of 133 and a mildly elevated total bilirubin at 1.5. CBC is within normal limits. Urinalysis shows small leukocytes and is nitrite negative. Urine sent for culture as the patient has no urinary symptoms. CT abdomen and pelvis with contrast showed no acute abdominal or pelvic findings. It did reveal an infrarenal abdominal aortic aneurysm and recommended follow-up with CTA. I advised the patient of the findings in the CT scan and encouraged follow-up with her primary care provider and with vascular surgeon Dr. Donnetta Hutching.  Ad reevaluation patient reports she is feeling much better. She has tolerated by mouth. Will discharge with close follow-up by primary care and vascular surgery. Discussed using BRAT diet and strict and specific return precautions. I advised the patient to follow-up with their primary care  provider this week. I advised the patient to return to the emergency department with new or worsening symptoms or new concerns. The patient verbalized understanding and agreement with plan.   This patient was discussed with and evaluated by Dr. Tomi Bamberger who agrees with assessment and plan.   Waynetta Pean, PA-C 04/14/16 [place], MD 04/14/16 [phone]

## 2016-04-14 NOTE — Discharge Instructions (Signed)
Abdominal Pain, Adult Many things can cause abdominal pain. Usually, abdominal pain is not caused by a disease and will improve without treatment. It can often be observed and treated at home. Your health care provider will do a physical exam and possibly order blood tests and X-rays to help determine the seriousness of your pain. However, in many cases, more time must pass before a clear cause of the pain can be found. Before that point, your health care provider may not know if you need more testing or further treatment. HOME CARE INSTRUCTIONS Monitor your abdominal pain for any changes. The following actions may help to alleviate any discomfort you are experiencing:  Only take over-the-counter or prescription medicines as directed by your health care provider.  Do not take laxatives unless directed to do so by your health care provider.  Try a clear liquid diet (broth, tea, or water) as directed by your health care provider. Slowly move to a bland diet as tolerated. SEEK MEDICAL CARE IF:  You have unexplained abdominal pain.  You have abdominal pain associated with nausea or diarrhea.  You have pain when you urinate or have a bowel movement.  You experience abdominal pain that wakes you in the night.  You have abdominal pain that is worsened or improved by eating food.  You have abdominal pain that is worsened with eating fatty foods.  You have a fever. SEEK IMMEDIATE MEDICAL CARE IF:  Your pain does not go away within 2 hours.  You keep throwing up (vomiting).  Your pain is felt only in portions of the abdomen, such as the right side or the left lower portion of the abdomen.  You pass bloody or black tarry stools. MAKE SURE YOU:  Understand these instructions.  Will watch your condition.  Will get help right away if you are not doing well or get worse.   This information is not intended to replace advice given to you by your health care provider. Make sure you discuss  any questions you have with your health care provider.   Document Released: 09/22/2005 Document Revised: 09/03/2015 Document Reviewed: 08/22/2013 Elsevier Interactive Patient Education 2016 [place] Choices to Help Relieve Diarrhea, Adult When you have diarrhea, the foods you eat and your eating habits are very important. Choosing the right foods and drinks can help relieve diarrhea. Also, because diarrhea can last up to 7 days, you need to replace lost fluids and electrolytes (such as sodium, potassium, and chloride) in order to help prevent dehydration.  WHAT GENERAL GUIDELINES DO I NEED TO FOLLOW?  Slowly drink 1 cup (8 oz) of fluid for each episode of diarrhea. If you are getting enough fluid, your urine will be clear or pale yellow.  Eat starchy foods. Some good choices include white rice, white toast, pasta, low-fiber cereal, baked potatoes (without the skin), saltine crackers, and bagels.  Avoid large servings of any cooked vegetables.  Limit fruit to two servings per day. A serving is  cup or 1 small piece.  Choose foods with less than 2 g of fiber per serving.  Limit fats to less than 8 tsp (38 g) per day.  Avoid fried foods.  Eat foods that have probiotics in them. Probiotics can be found in certain dairy products.  Avoid foods and beverages that may increase the speed at which food moves through the stomach and intestines (gastrointestinal tract). Things to avoid include:  High-fiber foods, such as dried fruit, raw fruits and vegetables, nuts, seeds,  and whole grain foods.  Spicy foods and high-fat foods.  Foods and beverages sweetened with high-fructose corn syrup, honey, or sugar alcohols such as xylitol, sorbitol, and mannitol. WHAT FOODS ARE RECOMMENDED? Grains White rice. White, [place], or pita breads (fresh or toasted), including plain rolls, buns, or bagels. White pasta. Saltine, soda, or graham crackers. Pretzels. Low-fiber cereal. Cooked cereals made  with water (such as cornmeal, farina, or cream cereals). Plain muffins. Matzo. Melba toast. Zwieback.  Vegetables Potatoes (without the skin). Strained tomato and vegetable juices. Most well-cooked and canned vegetables without seeds. Tender lettuce. Fruits Cooked or canned applesauce, apricots, cherries, fruit cocktail, grapefruit, peaches, pears, or plums. Fresh bananas, apples without skin, cherries, grapes, cantaloupe, grapefruit, peaches, oranges, or plums.  Meat and Other Protein Products Baked or boiled chicken. Eggs. Tofu. Fish. Seafood. Smooth peanut butter. Ground or well-cooked tender beef, ham, veal, lamb, pork, or poultry.  Dairy Plain yogurt, kefir, and unsweetened liquid yogurt. Lactose-free milk, buttermilk, or soy milk. Plain hard cheese. Beverages Sport drinks. Clear broths. Diluted fruit juices (except prune). Regular, caffeine-free sodas such as ginger ale. Water. Decaffeinated teas. Oral rehydration solutions. Sugar-free beverages not sweetened with sugar alcohols. Other Bouillon, broth, or soups made from recommended foods.  The items listed above may not be a complete list of recommended foods or beverages. Contact your dietitian for more options. WHAT FOODS ARE NOT RECOMMENDED? Grains Whole grain, whole wheat, bran, or rye breads, rolls, pastas, crackers, and cereals. Wild or brown rice. Cereals that contain more than 2 g of fiber per serving. Corn tortillas or taco shells. Cooked or dry oatmeal. Granola. Popcorn. Vegetables Raw vegetables. Cabbage, broccoli, Brussels sprouts, artichokes, baked beans, beet greens, corn, kale, legumes, peas, sweet potatoes, and yams. Potato skins. Cooked spinach and cabbage. Fruits Dried fruit, including raisins and dates. Raw fruits. Stewed or dried prunes. Fresh apples with skin, apricots, mangoes, pears, raspberries, and strawberries.  Meat and Other Protein Products Chunky peanut butter. Nuts and seeds. Beans and lentils. Berniece Salines.    Dairy High-fat cheeses. Milk, chocolate milk, and beverages made with milk, such as milk shakes. Cream. Ice cream. Sweets and Desserts Sweet rolls, doughnuts, and sweet breads. Pancakes and waffles. Fats and Oils Butter. Cream sauces. Margarine. Salad oils. Plain salad dressings. Olives. Avocados.  Beverages Caffeinated beverages (such as coffee, tea, soda, or energy drinks). Alcoholic beverages. Fruit juices with pulp. Prune juice. Soft drinks sweetened with high-fructose corn syrup or sugar alcohols. Other Coconut. Hot sauce. Chili powder. Mayonnaise. Gravy. Cream-based or milk-based soups.  The items listed above may not be a complete list of foods and beverages to avoid. Contact your dietitian for more information. WHAT SHOULD I DO IF I BECOME DEHYDRATED? Diarrhea can sometimes lead to dehydration. Signs of dehydration include dark urine and dry mouth and skin. If you think you are dehydrated, you should rehydrate with an oral rehydration solution. These solutions can be purchased at pharmacies, retail stores, or online.  Drink -1 cup (120-240 mL) of oral rehydration solution each time you have an episode of diarrhea. If drinking this amount makes your diarrhea worse, try drinking smaller amounts more often. For example, drink 1-3 tsp (5-15 mL) every 5-10 minutes.  A general rule for staying hydrated is to drink 1-2 L of fluid per day. Talk to your health care provider about the specific amount you should be drinking each day. Drink enough fluids to keep your urine clear or pale yellow.   This information is not intended to replace advice  given to you by your health care provider. Make sure you discuss any questions you have with your health care provider.   Document Released: 03/04/2004 Document Revised: 01/03/2015 Document Reviewed: 11/05/2013 Elsevier Interactive Patient Education 2016 Elsevier Inc[employer]  Abdominal Aortic Aneurysm An aneurysm is a weakened or damaged part of an artery  wall that bulges from the normal force of blood pumping through the body. An abdominal aortic aneurysm is an aneurysm that occurs in the lower part of the aorta, the main artery of the body.  The major concern with an abdominal aortic aneurysm is that it can enlarge and burst (rupture) or blood can flow between the layers of the wall of the aorta through a tear (aorticdissection). Both of these conditions can cause bleeding inside the body and can be life threatening unless diagnosed and treated promptly. CAUSES  The exact cause of an abdominal aortic aneurysm is unknown. Some contributing factors are:   A hardening of the arteries caused by the buildup of fat and other substances in the lining of a blood vessel (arteriosclerosis).  Inflammation of the walls of an artery (arteritis).   Connective tissue diseases, such as Marfan syndrome.   Abdominal trauma.   An infection, such as syphilis or staphylococcus, in the wall of the aorta (infectious aortitis) caused by bacteria. RISK FACTORS  Risk factors that contribute to an abdominal aortic aneurysm may include:  Age older than 53 years.   High blood pressure (hypertension).  Female gender.  Ethnicity (white race).  Obesity.  Family history of aneurysm (first degree relatives only).  Tobacco use. PREVENTION  The following healthy lifestyle habits may help decrease your risk of abdominal aortic aneurysm:  Quitting smoking. Smoking can raise your blood pressure and cause arteriosclerosis.  Limiting or avoiding alcohol.  Keeping your blood pressure, blood sugar level, and cholesterol levels within normal limits.  Decreasing your salt intake. In somepeople, too much salt can raise blood pressure and increase your risk of abdominal aortic aneurysm.  Eating a diet low in saturated fats and cholesterol.  Increasing your fiber intake by including whole grains, vegetables, and fruits in your diet. Eating these foods may help lower  blood pressure.  Maintaining a healthy weight.  Staying physically active and exercising regularly. SYMPTOMS  The symptoms of abdominal aortic aneurysm may vary depending on the size and rate of growth of the aneurysm.Most grow slowly and do not have any symptoms. When symptoms do occur, they may include:  Pain (abdomen, side, lower back, or groin). The pain may vary in intensity. A sudden onset of severe pain may indicate that the aneurysm has ruptured.  Feeling full after eating only small amounts of food.  Nausea or vomiting or both.  Feeling a pulsating lump in the abdomen.  Feeling faint or passing out. DIAGNOSIS  Since most unruptured abdominal aortic aneurysms have no symptoms, they are often discovered during diagnostic exams for other conditions. An aneurysm may be found during the following procedures:  Ultrasonography (A one-time screening for abdominal aortic aneurysm by ultrasonography is also recommended for all men aged 85-75 years who have ever smoked).  X-ray exams.  A computed tomography (CT).  Magnetic resonance imaging (MRI).  Angiography or arteriography. TREATMENT  Treatment of an abdominal aortic aneurysm depends on the size of your aneurysm, your age, and risk factors for rupture. Medication to control blood pressure and pain may be used to manage aneurysms smaller than 6 cm. Regular monitoring for enlargement may be recommended by your  caregiver if:  The aneurysm is 3-4 cm in size (an annual ultrasonography may be recommended).  The aneurysm is 4-4.5 cm in size (an ultrasonography every 6 months may be recommended).  The aneurysm is larger than 4.5 cm in size (your caregiver may ask that you be examined by a vascular surgeon). If your aneurysm is larger than 6 cm, surgical repair may be recommended. There are two main methods for repair of an aneurysm:   Endovascular repair (a minimally invasive surgery). This is done most often.  Open repair. This  method is used if an endovascular repair is not possible.   This information is not intended to replace advice given to you by your health care provider. Make sure you discuss any questions you have with your health care provider.   Document Released: 09/22/2005 Document Revised: 04/09/2013 Document Reviewed: 01/12/2013 Elsevier Interactive Patient Education [employer].

## 2016-04-14 NOTE — ED Notes (Signed)
Pt sts abd pain with some diarrhea; pt sts seen Sunday for fecal impaction; pt sts "shaking since Sunday" and feels confused

## 2016-04-16 DIAGNOSIS — R2689 Other abnormalities of gait and mobility: Secondary | ICD-10-CM | POA: Diagnosis not present

## 2016-04-16 DIAGNOSIS — I714 Abdominal aortic aneurysm, without rupture: Secondary | ICD-10-CM | POA: Diagnosis not present

## 2016-04-16 DIAGNOSIS — K5909 Other constipation: Secondary | ICD-10-CM | POA: Diagnosis not present

## 2016-04-16 DIAGNOSIS — I700 Atherosclerosis of aorta: Secondary | ICD-10-CM | POA: Diagnosis not present

## 2016-04-16 LAB — URINE CULTURE

## 2016-04-20 ENCOUNTER — Encounter: Payer: Medicare Other | Admitting: Vascular Surgery

## 2016-04-21 DIAGNOSIS — M5441 Lumbago with sciatica, right side: Secondary | ICD-10-CM | POA: Diagnosis not present

## 2016-04-21 DIAGNOSIS — G894 Chronic pain syndrome: Secondary | ICD-10-CM | POA: Diagnosis not present

## 2016-04-21 DIAGNOSIS — M5136 Other intervertebral disc degeneration, lumbar region: Secondary | ICD-10-CM | POA: Diagnosis not present

## 2016-04-22 ENCOUNTER — Encounter: Payer: Self-pay | Admitting: Vascular Surgery

## 2016-04-27 ENCOUNTER — Ambulatory Visit (INDEPENDENT_AMBULATORY_CARE_PROVIDER_SITE_OTHER): Payer: Medicare Other | Admitting: Vascular Surgery

## 2016-04-27 ENCOUNTER — Encounter: Payer: Self-pay | Admitting: Vascular Surgery

## 2016-04-27 VITALS — BP 128/75 | HR 68 | Temp 97.2°F | Resp 16 | Ht 67.0 in | Wt 166.0 lb

## 2016-04-27 DIAGNOSIS — I714 Abdominal aortic aneurysm, without rupture, unspecified: Secondary | ICD-10-CM

## 2016-04-27 NOTE — Progress Notes (Signed)
[facility] [place]  Patient name: Molly Meyer MRN: [id number] DOB: Sep 13, 1924 Sex: female  REASON FOR CONSULT: Abdominal aortic aneurysm  HPI: Molly Meyer is a 82 y.o. female, who is seen today for a discussion regarding incidental finding on CT scan of abdominal aortic aneurysm. Her interpretation was a maximal diameter 4.9 cm. She had no known prior history of this. She does have a history of severe degenerative disc disease and is being considered for a later for improvement of her pain. She is a quite active at her age of 18. She does have diabetes with no complications and hypertension.  Past Medical History  Diagnosis Date  . Diabetes mellitus without complication ([place])   . Hypertension   . Degenerative disc disease, lumbar     History reviewed. No pertinent family history.  SOCIAL HISTORY: Social History   Social History  . Marital Status: Married    Spouse Name: N/A  . Number of Children: N/A  . Years of Education: N/A   Occupational History  . Not on file.   Social History Main Topics  . Smoking status: Never Smoker   . Smokeless tobacco: Never Used  . Alcohol Use: No  . Drug Use: Not on file  . Sexual Activity: Not on file   Other Topics Concern  . Not on file   Social History Narrative    Allergies  Allergen Reactions  . Penicillins Rash    Thinks she may be able to take some forms now.Has patient had a PCN reaction causing immediate rash, facial/tongue/throat swelling, SOB or lightheadedness with hypotension: No Has patient had a PCN reaction causing severe rash involving mucus membranes or skin necrosis: No Has patient had a PCN reaction that required hospitalization No Has patient had a PCN reaction occurring within the last 10 years: No If all of the above answers are "NO", then may proceed with Cephalosporin use.    Current Outpatient Prescriptions  Medication Sig Dispense Refill  . acetaminophen (TYLENOL) 500  MG tablet Take 2 tablets (1,000 mg total) by mouth every 6 (six) hours as needed for mild pain or moderate pain. 30 tablet 0  . Cholecalciferol (VITAMIN D3) 2000 UNITS capsule Take 2,000 Units by mouth daily. 1 qd    . docusate sodium (COLACE) 100 MG capsule Take 100 mg by mouth daily as needed for mild constipation.    Marland Kitchen glucose blood (ACCU-CHEK SMARTVIEW) test strip CHECK BLOOD SUGAR ONCE BEFORE BREAKFAST AND DINNER    . Lactobacillus (ACIDOPHILUS PROBIOTIC) 10 MG TABS Take 10 mg by mouth 3 (three) times daily. 30 tablet 0  . Lancets Misc. (ACCU-CHEK FASTCLIX LANCET) KIT USE TO CHECK BS DAILY, DX: 250.02    . lidocaine (LIDODERM) 5 % Place 1 patch onto the skin daily. Remove & Discard patch within 12 hours or as directed by MD 30 patch 0  . lisinopril-hydrochlorothiazide (PRINZIDE,ZESTORETIC) 10-12.5 MG per tablet Take 1 tablet by mouth daily.    . metFORMIN (GLUCOPHAGE-XR) 500 MG 24 hr tablet TAKE ONE TABLET BY MOUTH EVERY DAY WITH DINNER.    . naproxen (NAPROSYN) 375 MG tablet Take 1 tablet (375 mg total) by mouth 2 (two) times daily with a meal. 14 tablet 0  . polyethylene glycol (MIRALAX / GLYCOLAX) packet Take 17 g by mouth daily as needed for mild constipation.    . pravastatin (PRAVACHOL) 40 MG tablet Take 40 mg by mouth daily.     . traMADol (ULTRAM) 50 MG  tablet Take 50 mg by mouth every 6 (six) hours as needed for moderate pain.    Marland Kitchen oxyCODONE-acetaminophen (PERCOCET/ROXICET) 5-325 MG tablet Take 1 tablet by mouth every 4 (four) hours as needed for moderate pain. Reported on 04/27/2016     No current facility-administered medications for this visit.    REVIEW OF SYSTEMS:  _0  denotes positive finding, _1  denotes negative finding Cardiac  Comments:  Chest pain or chest pressure:    Shortness of breath upon exertion:    Short of breath when lying flat:    Irregular heart rhythm:        Vascular    Pain in calf, thigh, or hip brought on by ambulation:    Pain in feet at night that  wakes you up from your sleep:     Blood clot in your veins:    Leg swelling:         Pulmonary    Oxygen at home:    Productive cough:     Wheezing:         Neurologic    Sudden weakness in arms or legs:     Sudden numbness in arms or legs:     Sudden onset of difficulty speaking or slurred speech:    Temporary loss of vision in one eye:     Problems with dizziness:         Gastrointestinal    Blood in stool:     Vomited blood:         Genitourinary    Burning when urinating:     Blood in urine:        Psychiatric    Major depression:         Hematologic    Bleeding problems:    Problems with blood clotting too easily:        Skin    Rashes or ulcers:        Constitutional    Fever or chills:      PHYSICAL EXAM: Filed Vitals:   04/27/16 1453  BP: 128/75  Pulse: 68  Temp: 97.2 F (36.2 C)  TempSrc: Oral  Resp: 16  Height: _2  (1.702 m)  Weight: 166 lb (75.297 kg)  SpO2: 96%    GENERAL: The patient is a well-nourished female, in no acute distress. The vital signs are documented above. CARDIAC: There is a regular rate and rhythm.  VASCULAR: Carotid arteries without bruits bilaterally. 2+ radial 2+ popliteal and 2+ dorsalis pedis pulses bilaterally PULMONARY: There is good air exchange bilaterally without wheezing or rales. ABDOMEN: Soft and non-tender with normal pitched bowel sounds. Prominent aortic pulsation with no tenderness MUSCULOSKELETAL: There are no major deformities or cyanosis. NEUROLOGIC: No focal weakness or paresthesias are detected. SKIN: There are no ulcers or rashes noted. Does have one area with a small eschar over her lateral calf above her ankle. No surrounding erythema. PSYCHIATRIC: The patient has a normal affect.  DATA:  I reviewed her CT scan and discussed the images with her. This does show an infrarenal abdominal aortic aneurysm. My interpretation of maximal diameter somewhat smaller than the radiologist interpretation. She does  have some tortuosity and the measurement from radiology a 5.0 is on somewhat of a bias. And going with a true axis of her aorta is closer to 4.2 cm.  MEDICAL ISSUES: Infrarenal aortic aneurysm, asymptomatic. Discussed this at length with the patient. Discussed symptoms of leaking aneurysm and her need to report immediately to the emergency  room should this occur. Explained that she does not need to limit her activities regarding her aneurysm. Explained that even if her aneurysm was 5 cm that we would not recommend elective repair. Certainly defer this at least until it was 5-1/2-6 cm giving her advanced age. She understands we will see her again in one year with ultrasound follow-up   Kayman Snuffer [facility] [employer]: [phone]

## 2016-05-05 ENCOUNTER — Telehealth (HOSPITAL_COMMUNITY): Payer: Self-pay

## 2016-05-11 ENCOUNTER — Ambulatory Visit: Payer: Medicare Other | Admitting: Podiatry

## 2016-05-19 ENCOUNTER — Encounter: Payer: Self-pay | Admitting: Podiatry

## 2016-05-19 ENCOUNTER — Ambulatory Visit (INDEPENDENT_AMBULATORY_CARE_PROVIDER_SITE_OTHER): Payer: Medicare Other | Admitting: Podiatry

## 2016-05-19 VITALS — BP 109/61 | HR 97

## 2016-05-19 DIAGNOSIS — M79606 Pain in leg, unspecified: Secondary | ICD-10-CM

## 2016-05-19 DIAGNOSIS — B351 Tinea unguium: Secondary | ICD-10-CM | POA: Diagnosis not present

## 2016-05-19 DIAGNOSIS — E0842 Diabetes mellitus due to underlying condition with diabetic polyneuropathy: Secondary | ICD-10-CM | POA: Diagnosis not present

## 2016-05-19 NOTE — Progress Notes (Signed)
SUBJECTIVE: 82 y.o. year old female presents aided by walker, requesting toe nails trimmed.   OBJECTIVE: DERMATOLOGIC EXAMINATION: All nails are thick dystrophic yellow nails x 10.  Thick ingrown nail right medial border with pain.  VASCULAR EXAMINATION OF LOWER LIMBS: Pedal pulses: All pedal pulses are palpable with normal pulsation. Capillary Filling times within 3 seconds in all digits.  No edema or erythema, no ischemic signs noted. Temperature gradient from tibial crest to dorsum of foot is within normal bilateral. NEUROLOGIC EXAMINATION OF THE LOWER LIMBS: All epicritic and tactile sensations grossly intact.  MUSCULOSKELETAL EXAMINATION: Positive for collapsed midtarsal bone (Talonavicular complex) bilateral. Flattened mid arch bilateral. Excess abduction and pronation of Subtalar joint bilateral. Excess ankle joint abduction with weight bearing bilateral.   ASSESSMENT: Diabetic neuropathy bilateral. Subluxed mid tarsal joint (Talonavicular complex) bilateral. Excess pronation STJ and ankle joint bilateral. Onychomycosis bilateral. Pain in lower limb. High risk of fall.  PLAN: All nails debrided. Return in 3 months

## 2016-05-19 NOTE — Patient Instructions (Signed)
Seen for hypertrophic nails. All nails debrided. Return in 3 months or as needed.  

## 2016-06-02 NOTE — Addendum Note (Signed)
Addended by: Thresa Ross C on: 06/02/2016 11:35 AM   Modules accepted: Orders

## 2016-06-08 DIAGNOSIS — M5440 Lumbago with sciatica, unspecified side: Secondary | ICD-10-CM | POA: Diagnosis not present

## 2016-06-08 DIAGNOSIS — F4542 Pain disorder with related psychological factors: Secondary | ICD-10-CM | POA: Diagnosis not present

## 2016-06-08 DIAGNOSIS — F419 Anxiety disorder, unspecified: Secondary | ICD-10-CM | POA: Diagnosis not present

## 2016-06-08 DIAGNOSIS — G894 Chronic pain syndrome: Secondary | ICD-10-CM | POA: Diagnosis not present

## 2016-07-30 DIAGNOSIS — G894 Chronic pain syndrome: Secondary | ICD-10-CM | POA: Diagnosis not present

## 2016-07-30 DIAGNOSIS — M5416 Radiculopathy, lumbar region: Secondary | ICD-10-CM | POA: Diagnosis not present

## 2016-07-30 DIAGNOSIS — G8929 Other chronic pain: Secondary | ICD-10-CM | POA: Diagnosis not present

## 2016-07-30 DIAGNOSIS — M961 Postlaminectomy syndrome, not elsewhere classified: Secondary | ICD-10-CM | POA: Diagnosis not present

## 2016-08-09 DIAGNOSIS — M533 Sacrococcygeal disorders, not elsewhere classified: Secondary | ICD-10-CM | POA: Diagnosis not present

## 2016-08-09 DIAGNOSIS — M5136 Other intervertebral disc degeneration, lumbar region: Secondary | ICD-10-CM | POA: Diagnosis not present

## 2016-08-09 DIAGNOSIS — M5441 Lumbago with sciatica, right side: Secondary | ICD-10-CM | POA: Diagnosis not present

## 2016-08-10 DIAGNOSIS — I700 Atherosclerosis of aorta: Secondary | ICD-10-CM | POA: Diagnosis not present

## 2016-08-10 DIAGNOSIS — N182 Chronic kidney disease, stage 2 (mild): Secondary | ICD-10-CM | POA: Diagnosis not present

## 2016-08-10 DIAGNOSIS — E559 Vitamin D deficiency, unspecified: Secondary | ICD-10-CM | POA: Diagnosis not present

## 2016-08-10 DIAGNOSIS — E1122 Type 2 diabetes mellitus with diabetic chronic kidney disease: Secondary | ICD-10-CM | POA: Diagnosis not present

## 2016-08-10 DIAGNOSIS — N08 Glomerular disorders in diseases classified elsewhere: Secondary | ICD-10-CM | POA: Diagnosis not present

## 2016-08-10 DIAGNOSIS — I1310 Hypertensive heart and chronic kidney disease without heart failure, with stage 1 through stage 4 chronic kidney disease, or unspecified chronic kidney disease: Secondary | ICD-10-CM | POA: Diagnosis not present

## 2016-08-10 DIAGNOSIS — Z0000 Encounter for general adult medical examination without abnormal findings: Secondary | ICD-10-CM | POA: Diagnosis not present

## 2016-08-17 DIAGNOSIS — M5441 Lumbago with sciatica, right side: Secondary | ICD-10-CM | POA: Diagnosis not present

## 2016-08-19 ENCOUNTER — Ambulatory Visit (INDEPENDENT_AMBULATORY_CARE_PROVIDER_SITE_OTHER): Payer: Medicare Other | Admitting: Podiatry

## 2016-08-19 ENCOUNTER — Encounter: Payer: Self-pay | Admitting: Podiatry

## 2016-08-19 VITALS — BP 130/81 | HR 90

## 2016-08-19 DIAGNOSIS — M79673 Pain in unspecified foot: Secondary | ICD-10-CM | POA: Diagnosis not present

## 2016-08-19 DIAGNOSIS — B351 Tinea unguium: Secondary | ICD-10-CM

## 2016-08-19 DIAGNOSIS — M79606 Pain in leg, unspecified: Secondary | ICD-10-CM

## 2016-08-19 NOTE — Patient Instructions (Signed)
Seen for hypertrophic nails. All nails debrided. Return in 3 months or as needed.  

## 2016-08-19 NOTE — Progress Notes (Signed)
SUBJECTIVE: 82 y.o. year old female presents aided by walker, requesting toe nails trimmed. Having much pain in her lower back and is scheduled to have a procedure, minor implant.  OBJECTIVE: DERMATOLOGIC EXAMINATION: All nails are thick dystrophic yellow nails x 10.  Thick ingrown nail right medial border with pain.  VASCULAR EXAMINATION OF LOWER LIMBS: Pedal pulses: All pedal pulses are palpable with normal pulsation. Capillary Filling times within 3 seconds in all digits.  No edema or erythema, no ischemic signs noted. Temperature gradient from tibial crest to dorsum of foot is within normal bilateral. NEUROLOGIC EXAMINATION OF THE LOWER LIMBS: All epicritic and tactile sensations grossly intact.  MUSCULOSKELETAL EXAMINATION: Positive for collapsed midtarsal bone (Talonavicular complex) bilateral. Flattened mid arch bilateral. Excess abduction and pronation of Subtalar joint bilateral. Excess ankle joint abduction with weight bearing bilateral.   ASSESSMENT: Diabetic neuropathy bilateral. Subluxed mid tarsal joint (Talonavicular complex) bilateral. Excess pronation STJ and ankle joint bilateral. Onychomycosis bilateral. Pain in lower limb. High risk of fall.  PLAN: All nails debrided. Return in 3 months

## 2016-08-24 DIAGNOSIS — G894 Chronic pain syndrome: Secondary | ICD-10-CM | POA: Diagnosis not present

## 2016-08-24 DIAGNOSIS — M5441 Lumbago with sciatica, right side: Secondary | ICD-10-CM | POA: Diagnosis not present

## 2016-08-24 DIAGNOSIS — M533 Sacrococcygeal disorders, not elsewhere classified: Secondary | ICD-10-CM | POA: Diagnosis not present

## 2016-08-24 DIAGNOSIS — M5136 Other intervertebral disc degeneration, lumbar region: Secondary | ICD-10-CM | POA: Diagnosis not present

## 2016-09-03 DIAGNOSIS — M5430 Sciatica, unspecified side: Secondary | ICD-10-CM | POA: Diagnosis not present

## 2016-09-03 DIAGNOSIS — K7689 Other specified diseases of liver: Secondary | ICD-10-CM | POA: Diagnosis not present

## 2016-09-03 DIAGNOSIS — Q6100 Congenital renal cyst, unspecified: Secondary | ICD-10-CM | POA: Diagnosis not present

## 2016-09-09 ENCOUNTER — Other Ambulatory Visit: Payer: Self-pay | Admitting: Internal Medicine

## 2016-09-09 DIAGNOSIS — K7689 Other specified diseases of liver: Secondary | ICD-10-CM

## 2016-09-10 DIAGNOSIS — M5136 Other intervertebral disc degeneration, lumbar region: Secondary | ICD-10-CM | POA: Diagnosis not present

## 2016-09-10 DIAGNOSIS — M961 Postlaminectomy syndrome, not elsewhere classified: Secondary | ICD-10-CM | POA: Diagnosis not present

## 2016-09-10 DIAGNOSIS — G8929 Other chronic pain: Secondary | ICD-10-CM | POA: Diagnosis not present

## 2016-09-10 DIAGNOSIS — G894 Chronic pain syndrome: Secondary | ICD-10-CM | POA: Diagnosis not present

## 2016-09-10 DIAGNOSIS — M5442 Lumbago with sciatica, left side: Secondary | ICD-10-CM | POA: Diagnosis not present

## 2016-09-24 DIAGNOSIS — Z4789 Encounter for other orthopedic aftercare: Secondary | ICD-10-CM | POA: Diagnosis not present

## 2016-09-24 DIAGNOSIS — Z9889 Other specified postprocedural states: Secondary | ICD-10-CM | POA: Diagnosis not present

## 2016-09-24 DIAGNOSIS — M5442 Lumbago with sciatica, left side: Secondary | ICD-10-CM | POA: Diagnosis not present

## 2016-10-01 DIAGNOSIS — H524 Presbyopia: Secondary | ICD-10-CM | POA: Diagnosis not present

## 2016-10-01 DIAGNOSIS — Z961 Presence of intraocular lens: Secondary | ICD-10-CM | POA: Diagnosis not present

## 2016-10-01 DIAGNOSIS — H43813 Vitreous degeneration, bilateral: Secondary | ICD-10-CM | POA: Diagnosis not present

## 2016-10-01 DIAGNOSIS — E119 Type 2 diabetes mellitus without complications: Secondary | ICD-10-CM | POA: Diagnosis not present

## 2016-10-09 DIAGNOSIS — Z23 Encounter for immunization: Secondary | ICD-10-CM | POA: Diagnosis not present

## 2016-10-12 DIAGNOSIS — Z4789 Encounter for other orthopedic aftercare: Secondary | ICD-10-CM | POA: Diagnosis not present

## 2016-10-12 DIAGNOSIS — M5441 Lumbago with sciatica, right side: Secondary | ICD-10-CM | POA: Diagnosis not present

## 2016-10-12 DIAGNOSIS — Z9889 Other specified postprocedural states: Secondary | ICD-10-CM | POA: Diagnosis not present

## 2016-10-13 ENCOUNTER — Other Ambulatory Visit: Payer: Medicare Other

## 2016-10-17 ENCOUNTER — Emergency Department (HOSPITAL_BASED_OUTPATIENT_CLINIC_OR_DEPARTMENT_OTHER)
Admission: EM | Admit: 2016-10-17 | Discharge: 2016-10-17 | Disposition: A | Discharge status: Home / Self Care | Payer: Medicare Other | Attending: Emergency Medicine | Admitting: Emergency Medicine

## 2016-10-17 ENCOUNTER — Encounter (HOSPITAL_BASED_OUTPATIENT_CLINIC_OR_DEPARTMENT_OTHER): Payer: Self-pay | Admitting: Emergency Medicine

## 2016-10-17 DIAGNOSIS — Z7984 Long term (current) use of oral hypoglycemic drugs: Secondary | ICD-10-CM | POA: Insufficient documentation

## 2016-10-17 DIAGNOSIS — K5900 Constipation, unspecified: Secondary | ICD-10-CM | POA: Diagnosis not present

## 2016-10-17 DIAGNOSIS — R51 Headache: Secondary | ICD-10-CM | POA: Diagnosis present

## 2016-10-17 DIAGNOSIS — E119 Type 2 diabetes mellitus without complications: Secondary | ICD-10-CM | POA: Diagnosis not present

## 2016-10-17 DIAGNOSIS — I10 Essential (primary) hypertension: Secondary | ICD-10-CM | POA: Insufficient documentation

## 2016-10-17 DIAGNOSIS — Z79899 Other long term (current) drug therapy: Secondary | ICD-10-CM | POA: Insufficient documentation

## 2016-10-17 MED ORDER — LIDOCAINE HCL 2 % EX GEL
1.0000 | Freq: Once | CUTANEOUS | Status: AC
Start: 2016-10-17 — End: 2016-10-17
  Administered 2016-10-17: 1
  Filled 2016-10-17: qty 20

## 2016-10-17 NOTE — ED Provider Notes (Signed)
[facility] DEPT [facility] Provider Note   CSN: [id number] Arrival date & time: 10/17/16  1937   By signing my name below, I, Molly Meyer, attest that this documentation has been prepared under the direction and in the presence of [name], MD . Electronically Signed: Neta Meyer, ED Scribe. 10/17/2016. 7:52 PM.   History   Chief Complaint Chief Complaint  Patient presents with  . Constipation   The history is provided by the patient. No language interpreter was used.   HPI Comments:  Molly Meyer is a 82 y.o. female with PMHx of DM and HTN who presents to the Emergency Department complaining of constant constipation x 3 days. Pt's last BM was 3 days ago. Pt complains of a mild associated headache which was relieved by tylenol. Pt has taken linzess, miralax and colace with no relief. Pt denies any fall/trauma, use of narcotic medications. Pt denies abdominal pain, nausea, vomiting, urinary symptoms, back pain, fevers.  Reports pain in rectum with attempting to have BM.  Past Medical History:  Diagnosis Date  . Degenerative disc disease, lumbar   . Diabetes mellitus without complication ([place])   . Hypertension     Patient Active Problem List   Diagnosis Date Noted  . Onychomycosis 01/01/2015  . Subluxation of foot, acquired 01/01/2015  . Pain in lower limb 01/01/2015  . Diabetic neuropathy associated with diabetes mellitus due to underlying condition ([place]) 01/01/2015    Past Surgical History:  Procedure Laterality Date  . KNEE SURGERY      OB History    No data available       Home Medications    Prior to Admission medications   Medication Sig Start Date End Date Taking? Authorizing Provider  acetaminophen (TYLENOL) 500 MG tablet Take 2 tablets (1,000 mg total) by mouth every 6 (six) hours as needed for mild pain or moderate pain. 06/20/15   Blanchie Dessert, MD  Cholecalciferol (VITAMIN D3) 2000 UNITS capsule Take 2,000 Units by mouth daily.  1 qd    Historical Provider, MD  docusate sodium (COLACE) 100 MG capsule Take 100 mg by mouth daily as needed for mild constipation.    Historical Provider, MD  glucose blood (ACCU-CHEK SMARTVIEW) test strip CHECK BLOOD SUGAR ONCE BEFORE BREAKFAST AND DINNER 10/14/14   Historical Provider, MD  Lactobacillus (ACIDOPHILUS PROBIOTIC) 10 MG TABS Take 10 mg by mouth 3 (three) times daily. 04/14/16   Waynetta Pean, PA-C  Lancets Misc. (ACCU-CHEK FASTCLIX LANCET) KIT USE TO CHECK BS DAILY, DX: 250.02 06/25/13   Historical Provider, MD  lidocaine (LIDODERM) 5 % Place 1 patch onto the skin daily. Remove & Discard patch within 12 hours or as directed by MD 06/20/15   Blanchie Dessert, MD  lisinopril-hydrochlorothiazide (PRINZIDE,ZESTORETIC) 10-12.5 MG per tablet Take 1 tablet by mouth daily.    Historical Provider, MD  metFORMIN (GLUCOPHAGE-XR) 500 MG 24 hr tablet TAKE ONE TABLET BY MOUTH EVERY DAY WITH DINNER. 10/04/14   Historical Provider, MD  naproxen (NAPROSYN) 375 MG tablet Take 1 tablet (375 mg total) by mouth 2 (two) times daily with a meal. 06/20/15   Blanchie Dessert, MD  oxyCODONE-acetaminophen (PERCOCET/ROXICET) 5-325 MG tablet Take 1 tablet by mouth every 4 (four) hours as needed for moderate pain. Reported on 04/27/2016 01/08/16   Historical Provider, MD  polyethylene glycol (MIRALAX / GLYCOLAX) packet Take 17 g by mouth daily as needed for mild constipation.    Historical Provider, MD  pravastatin (PRAVACHOL) 40 MG tablet Take 40  mg by mouth daily.  01/08/16   Historical Provider, MD  traMADol (ULTRAM) 50 MG tablet Take 50 mg by mouth every 6 (six) hours as needed for moderate pain.    Historical Provider, MD    Family History History reviewed. No pertinent family history.  Social History Social History  Substance Use Topics  . Smoking status: Never Smoker  . Smokeless tobacco: Never Used  . Alcohol use No     Allergies   Penicillins   Review of Systems Review of Systems    Constitutional: Negative for fever.  HENT: Negative for sore throat.   Eyes: Negative for visual disturbance.  Respiratory: Negative for cough and shortness of breath.   Cardiovascular: Negative for chest pain.  Gastrointestinal: Positive for constipation. Negative for abdominal distention, abdominal pain, nausea and vomiting.  Genitourinary: Negative for difficulty urinating and dysuria.  Musculoskeletal: Negative for back pain and neck pain.  Skin: Negative for rash.  Neurological: Positive for headaches. Negative for syncope.  All other systems reviewed and are negative.    Physical Exam Updated Vital Signs BP 137/96   Pulse 104   Temp 97.9 F (36.6 C)   Resp 20   Ht '5\' 7"'$  (1.702 m)   Wt 169 lb (76.7 kg)   SpO2 99%   BMI 26.47 kg/m   Physical Exam  Constitutional: She is oriented to person, place, and time. She appears well-developed and well-nourished. No distress.  HENT:  Head: Normocephalic and atraumatic.  Eyes: Conjunctivae and EOM are normal.  Neck: Neck supple.  Cardiovascular: Normal rate, regular rhythm, normal heart sounds and intact distal pulses.   No murmur heard. Pulmonary/Chest: Effort normal and breath sounds normal. No respiratory distress. She has no wheezes. She has no rales.  Abdominal: Soft. She exhibits no distension and no mass. There is no tenderness. There is no guarding.  Genitourinary: Rectal exam shows no external hemorrhoid.  Musculoskeletal: She exhibits no edema.  Neurological: She is alert and oriented to person, place, and time.  Skin: Skin is warm and dry. No rash noted.  Psychiatric: She has a normal mood and affect.  Nursing note and vitals reviewed.    ED Treatments / Results  DIAGNOSTIC STUDIES:  Oxygen Saturation is 100% on RA, normal by my interpretation.    COORDINATION OF CARE:  7:54 PM Discussed treatment plan with pt at bedside and pt agreed to plan.   Labs (all labs ordered are listed, but only abnormal results  are displayed) Labs Reviewed - No data to display  EKG  EKG Interpretation None       Radiology No results found.  Procedures Fecal disimpaction Date/Time: 10/18/2016 1:34 PM Performed by: [name] Authorized by: [name]  Consent: Verbal consent obtained. Risks and benefits: risks, benefits and alternatives were discussed Consent given by: patient Required items: required blood products, implants, devices, and special equipment available Local anesthesia used: yes  Anesthesia: Local anesthesia used: yes Local Anesthetic: topical anesthetic (lidocaine gel)  Sedation: Patient sedated: no Patient tolerance: Patient tolerated the procedure well with no immediate complications    (including critical care time)  Medications Ordered in ED Medications  lidocaine (XYLOCAINE) 2 % jelly 1 application (1 application Other Given by Other 10/17/16 2006)     Initial Impression / Assessment and Plan / ED Course  I have reviewed the triage vital signs and the nursing notes.  Pertinent labs & imaging results that were available during my care of the patient were reviewed by me  and considered in my medical decision making (see chart for details).  Clinical Course   82yo female with history of DM, htn, constipation, back nerve stimulator in place, presents with concern for constipation. No abdominal pain, benign exam, no n/v, doubt SBO, diverticulitis, UTI.  History and physical exam consistent with constipation. Discussed options of more aggressive bowel regimen,or disimpaction and patient preference is for disimpaction. Patient with large impacted stool on rectal exam and fecal disimpaction performed with success.  Discussed bowel regimen, miralax, BID colace, increasing miralax to 4caps in 1 32oz bottle as needed, and PCP follow up.  Patient discharged in stable condition with understanding of reasons to return.   Final Clinical Impressions(s) / ED Diagnoses    Final diagnoses:  Constipation, unspecified constipation type    New Prescriptions Discharge Medication List as of 10/17/2016  8:25 PM    I personally performed the services described in this documentation, which was scribed in my presence. The recorded information has been reviewed and is accurate.    [name], MD 10/18/16 1344

## 2016-10-17 NOTE — ED Notes (Addendum)
MD at bedside prior to RN's assessment, see MD notes, pending orders.

## 2016-10-17 NOTE — ED Notes (Signed)
MD at bedside. 

## 2016-10-17 NOTE — ED Triage Notes (Signed)
Pt in c/o constipation, last BM x 3 days ago. Has been taking miralax and and colace with no relief. Pt alert, interactive, ambulatory in NAD.

## 2016-10-17 NOTE — ED Notes (Signed)
Disimpaction in progress by Md, pt tolerating well, lidocaine jelly used.

## 2016-10-17 NOTE — Discharge Instructions (Signed)
He may take Colace 100 mg twice a day, miralax once daily as needed and may increase to 4caps in one 32oz bottle of gatorade in one day if having continuing constipation.  Stay hydrated, eat fiber, consider prune juice. You may consider home glycerin suppositories. Follow up closely with your doctor.

## 2016-10-22 ENCOUNTER — Other Ambulatory Visit: Payer: Medicare Other

## 2016-10-27 ENCOUNTER — Other Ambulatory Visit: Payer: Self-pay | Admitting: Internal Medicine

## 2016-10-27 DIAGNOSIS — Q6100 Congenital renal cyst, unspecified: Secondary | ICD-10-CM

## 2016-10-27 DIAGNOSIS — K7689 Other specified diseases of liver: Secondary | ICD-10-CM

## 2016-10-28 ENCOUNTER — Inpatient Hospital Stay: Admission: RE | Admit: 2016-10-28 | Payer: Medicare Other | Source: Ambulatory Visit

## 2016-11-04 ENCOUNTER — Ambulatory Visit
Admission: RE | Admit: 2016-11-04 | Discharge: 2016-11-04 | Disposition: A | Discharge status: Home / Self Care | Payer: Medicare Other | Source: Ambulatory Visit | Attending: Internal Medicine | Admitting: Internal Medicine

## 2016-11-04 ENCOUNTER — Other Ambulatory Visit: Payer: Self-pay | Admitting: Internal Medicine

## 2016-11-04 DIAGNOSIS — I1310 Hypertensive heart and chronic kidney disease without heart failure, with stage 1 through stage 4 chronic kidney disease, or unspecified chronic kidney disease: Secondary | ICD-10-CM | POA: Diagnosis not present

## 2016-11-04 DIAGNOSIS — K7689 Other specified diseases of liver: Secondary | ICD-10-CM

## 2016-11-04 DIAGNOSIS — E1122 Type 2 diabetes mellitus with diabetic chronic kidney disease: Secondary | ICD-10-CM | POA: Diagnosis not present

## 2016-11-04 DIAGNOSIS — N281 Cyst of kidney, acquired: Secondary | ICD-10-CM | POA: Diagnosis not present

## 2016-11-04 DIAGNOSIS — Q6100 Congenital renal cyst, unspecified: Secondary | ICD-10-CM

## 2016-11-04 DIAGNOSIS — N182 Chronic kidney disease, stage 2 (mild): Secondary | ICD-10-CM | POA: Diagnosis not present

## 2016-11-04 DIAGNOSIS — N08 Glomerular disorders in diseases classified elsewhere: Secondary | ICD-10-CM | POA: Diagnosis not present

## 2016-11-11 DIAGNOSIS — M5136 Other intervertebral disc degeneration, lumbar region: Secondary | ICD-10-CM | POA: Diagnosis not present

## 2016-11-11 DIAGNOSIS — G894 Chronic pain syndrome: Secondary | ICD-10-CM | POA: Diagnosis not present

## 2016-11-16 ENCOUNTER — Ambulatory Visit: Payer: Medicare Other | Admitting: Podiatry

## 2016-11-23 ENCOUNTER — Encounter: Payer: Self-pay | Admitting: Podiatry

## 2016-11-23 ENCOUNTER — Ambulatory Visit (INDEPENDENT_AMBULATORY_CARE_PROVIDER_SITE_OTHER): Payer: Medicare Other | Admitting: Podiatry

## 2016-11-23 VITALS — BP 148/78 | HR 72

## 2016-11-23 DIAGNOSIS — B351 Tinea unguium: Secondary | ICD-10-CM

## 2016-11-23 DIAGNOSIS — M79672 Pain in left foot: Secondary | ICD-10-CM

## 2016-11-23 DIAGNOSIS — M79671 Pain in right foot: Secondary | ICD-10-CM | POA: Diagnosis not present

## 2016-11-23 NOTE — Progress Notes (Signed)
Subjective: 82 y.o. year old female presents requesting toe nails trimmed.  Got done with implant surgery on lower back. Not taking anymore pain pills. Now only use a cane to ambulate.  OBJECTIVE: DERMATOLOGIC EXAMINATION:  All nails are thick dystrophic yellow nails x 10.  Thick ingrown nail right medial border with pain.  VASCULAR EXAMINATION OF LOWER LIMBS: All pedal pulses are palpable with normal pulsation. Capillary Filling times within 3 seconds in all digits.  No edema or erythema, no ischemic signs noted.  NEUROLOGIC EXAMINATION OF THE LOWER LIMBS: Diagnosed with peripheral neuropathy. MUSCULOSKELETAL EXAMINATION: Flattened mid arch bilateral. Excess abduction and pronation of Subtalar joint bilateral.  ASSESSMENT: Diabetic neuropathy bilateral. Subluxed mid tarsal joint (Talonavicular complex) bilateral. Onychomycosis bilateral. Pain in foot bilateral.  PLAN: All nails debrided. Return in 3 months

## 2016-11-23 NOTE — Patient Instructions (Signed)
Seen for hypertrophic nails. No new problems noted. All nails debrided. Return in 3 months or as needed.  

## 2016-12-10 DIAGNOSIS — M5136 Other intervertebral disc degeneration, lumbar region: Secondary | ICD-10-CM | POA: Diagnosis not present

## 2016-12-10 DIAGNOSIS — G894 Chronic pain syndrome: Secondary | ICD-10-CM | POA: Diagnosis not present

## 2017-02-17 DIAGNOSIS — I1310 Hypertensive heart and chronic kidney disease without heart failure, with stage 1 through stage 4 chronic kidney disease, or unspecified chronic kidney disease: Secondary | ICD-10-CM | POA: Diagnosis not present

## 2017-02-17 DIAGNOSIS — E1122 Type 2 diabetes mellitus with diabetic chronic kidney disease: Secondary | ICD-10-CM | POA: Diagnosis not present

## 2017-02-17 DIAGNOSIS — I700 Atherosclerosis of aorta: Secondary | ICD-10-CM | POA: Diagnosis not present

## 2017-02-17 DIAGNOSIS — N08 Glomerular disorders in diseases classified elsewhere: Secondary | ICD-10-CM | POA: Diagnosis not present

## 2017-02-17 DIAGNOSIS — N182 Chronic kidney disease, stage 2 (mild): Secondary | ICD-10-CM | POA: Diagnosis not present

## 2017-02-23 ENCOUNTER — Encounter: Payer: Self-pay | Admitting: Podiatry

## 2017-02-23 ENCOUNTER — Ambulatory Visit (INDEPENDENT_AMBULATORY_CARE_PROVIDER_SITE_OTHER): Payer: Medicare Other | Admitting: Podiatry

## 2017-02-23 DIAGNOSIS — B351 Tinea unguium: Secondary | ICD-10-CM | POA: Diagnosis not present

## 2017-02-23 DIAGNOSIS — M79672 Pain in left foot: Secondary | ICD-10-CM

## 2017-02-23 DIAGNOSIS — M79671 Pain in right foot: Secondary | ICD-10-CM

## 2017-02-23 NOTE — Progress Notes (Signed)
Subjective: 84 y.o. year old female presents requesting toe nails trimmed.  Since her implant surgery on lower back she is using cane for her balance and does not drive any more.  OBJECTIVE: DERMATOLOGIC EXAMINATION:  All nails are thick dystrophic yellow nails x 10.  Thick ingrown nail right medial border with pain.  VASCULAR EXAMINATION OF LOWER LIMBS: All pedal pulses are palpable with normal pulsation. Capillary Filling times within 3 seconds in all digits.  No edema or erythema, no ischemic signs noted.  NEUROLOGIC EXAMINATION OF THE LOWER LIMBS: Diagnosed with peripheral neuropathy. MUSCULOSKELETAL EXAMINATION: Flattened mid arch bilateral. Excess abduction and pronation of Subtalar joint bilateral.  ASSESSMENT: Diabetic neuropathy bilateral. Subluxed mid tarsal joint (Talonavicular complex) bilateral. Onychomycosis bilateral. Pain in foot bilateral.  PLAN: All nails debrided. Return in 3 months

## 2017-02-23 NOTE — Patient Instructions (Signed)
Seen for hypertrophic nails. All nails debrided. Return in 3 months or as needed.  

## 2017-05-10 ENCOUNTER — Other Ambulatory Visit (HOSPITAL_COMMUNITY): Payer: Medicare Other

## 2017-05-10 ENCOUNTER — Ambulatory Visit: Payer: Medicare Other | Admitting: Vascular Surgery

## 2017-05-24 ENCOUNTER — Ambulatory Visit (INDEPENDENT_AMBULATORY_CARE_PROVIDER_SITE_OTHER): Payer: Medicare Other | Admitting: Podiatry

## 2017-05-24 DIAGNOSIS — M79671 Pain in right foot: Secondary | ICD-10-CM

## 2017-05-24 DIAGNOSIS — M79672 Pain in left foot: Secondary | ICD-10-CM

## 2017-05-24 DIAGNOSIS — B351 Tinea unguium: Secondary | ICD-10-CM | POA: Diagnosis not present

## 2017-05-24 NOTE — Patient Instructions (Addendum)
Seen for hypertrophic nails. All nails debrided. Return in 3 months or as needed.  

## 2017-05-25 ENCOUNTER — Encounter: Payer: Self-pay | Admitting: Podiatry

## 2017-05-25 NOTE — Progress Notes (Signed)
Subjective: 83 y.o. year old female presents requesting toe nails trimmed.  Denies any new problems.   OBJECTIVE: DERMATOLOGIC EXAMINATION:  All nails are thick dystrophic yellow nails x 10.  Thick ingrown nail right medial border with pain.  No abnormal skin lesions noted. VASCULAR EXAMINATION OF LOWER LIMBS: All pedal pulses are palpable with normal pulsation. Capillary Filling times within 3 seconds in all digits.  No edema or erythema, no ischemic signs noted.  NEUROLOGIC EXAMINATION OF THE LOWER LIMBS: Diagnosed with peripheral neuropathy. MUSCULOSKELETAL EXAMINATION: Flattened mid arch bilateral. Excess abduction and pronation of Subtalar joint bilateral.  ASSESSMENT: Diabetic neuropathy bilateral. Subluxed mid tarsal joint (Talonavicular complex) bilateral. Onychomycosis bilateral. Pain in foot bilateral.  PLAN: All nails debrided. Return in 3 months

## 2017-07-01 ENCOUNTER — Encounter: Payer: Self-pay | Admitting: Vascular Surgery

## 2017-07-05 ENCOUNTER — Ambulatory Visit: Payer: Medicare Other | Admitting: Vascular Surgery

## 2017-07-05 ENCOUNTER — Other Ambulatory Visit (HOSPITAL_COMMUNITY): Payer: Medicare Other

## 2017-07-12 ENCOUNTER — Ambulatory Visit (INDEPENDENT_AMBULATORY_CARE_PROVIDER_SITE_OTHER): Payer: Medicare Other | Admitting: Vascular Surgery

## 2017-07-12 ENCOUNTER — Encounter: Payer: Self-pay | Admitting: Vascular Surgery

## 2017-07-12 ENCOUNTER — Ambulatory Visit (HOSPITAL_COMMUNITY)
Admission: RE | Admit: 2017-07-12 | Discharge: 2017-07-12 | Disposition: A | Discharge status: Home / Self Care | Payer: Medicare Other | Source: Ambulatory Visit | Attending: Vascular Surgery | Admitting: Vascular Surgery

## 2017-07-12 VITALS — BP 150/89 | HR 69 | Temp 97.0°F | Resp 16 | Ht 68.0 in | Wt 167.0 lb

## 2017-07-12 DIAGNOSIS — I714 Abdominal aortic aneurysm, without rupture, unspecified: Secondary | ICD-10-CM

## 2017-07-12 DIAGNOSIS — I7409 Other arterial embolism and thrombosis of abdominal aorta: Secondary | ICD-10-CM | POA: Diagnosis not present

## 2017-07-12 NOTE — Progress Notes (Signed)
Vitals:   07/12/17 0941  BP: (!) 146/90  Pulse: 69  Resp: 16  Temp: (!) 97 F (36.1 C)  SpO2: 95%  Weight: 167 lb (75.8 kg)  Height: 5\' 8"  (1.727 m)

## 2017-07-12 NOTE — Progress Notes (Signed)
[facility] [place]  Patient name: Molly Meyer MRN: [id number] DOB: 18-Jul-1924 Sex: female  REASON FOR VISIT: Follow-up abdominal aortic aneurysm  HPI: Molly Meyer is a 83 y.o. female here today for follow-up. She is here today with her daughter. She looks quite good. She is does remain relatively active. She had a spine stimulator implanted and reports very nice response with this with relief of back pain and making it possible to walk with a cane and not a walker. She has no symptoms or if older aneurysm. Has had no cardiac disease.  Past Medical History:  Diagnosis Date  . Degenerative disc disease, lumbar   . Diabetes mellitus without complication ([place])   . Hypertension     No family history on file.  SOCIAL HISTORY: Social History  Substance Use Topics  . Smoking status: Never Smoker  . Smokeless tobacco: Never Used  . Alcohol use No    Allergies  Allergen Reactions  . Penicillins Rash    Thinks she may be able to take some forms now.Has patient had a PCN reaction causing immediate rash, facial/tongue/throat swelling, SOB or lightheadedness with hypotension: No Has patient had a PCN reaction causing severe rash involving mucus membranes or skin necrosis: No Has patient had a PCN reaction that required hospitalization No Has patient had a PCN reaction occurring within the last 10 years: No If all of the above answers are "NO", then may proceed with Cephalosporin use.    Current Outpatient Prescriptions  Medication Sig Dispense Refill  . acetaminophen (TYLENOL) 500 MG tablet Take 2 tablets (1,000 mg total) by mouth every 6 (six) hours as needed for mild pain or moderate pain. 30 tablet 0  . Cholecalciferol (VITAMIN D3) 2000 UNITS capsule Take 2,000 Units by mouth daily. 1 qd    . docusate sodium (COLACE) 100 MG capsule Take 100 mg by mouth daily as needed for mild constipation.    Marland Kitchen glucose blood  (ACCU-CHEK SMARTVIEW) test strip CHECK BLOOD SUGAR ONCE BEFORE BREAKFAST AND DINNER    . Lactobacillus (ACIDOPHILUS PROBIOTIC) 10 MG TABS Take 10 mg by mouth 3 (three) times daily. 30 tablet 0  . Lancets Misc. (ACCU-CHEK FASTCLIX LANCET) KIT USE TO CHECK BS DAILY, DX: 250.02    . lisinopril-hydrochlorothiazide (PRINZIDE,ZESTORETIC) 10-12.5 MG per tablet Take 1 tablet by mouth daily.    . pravastatin (PRAVACHOL) 40 MG tablet Take 40 mg by mouth daily.     Marland Kitchen lidocaine (LIDODERM) 5 % Place 1 patch onto the skin daily. Remove & Discard patch within 12 hours or as directed by MD (Patient not taking: Reported on 07/12/2017) 30 patch 0  . metFORMIN (GLUCOPHAGE-XR) 500 MG 24 hr tablet TAKE ONE TABLET BY MOUTH EVERY DAY WITH DINNER.    . naproxen (NAPROSYN) 375 MG tablet Take 1 tablet (375 mg total) by mouth 2 (two) times daily with a meal. (Patient not taking: Reported on 07/12/2017) 14 tablet 0  . oxyCODONE-acetaminophen (PERCOCET/ROXICET) 5-325 MG tablet Take 1 tablet by mouth every 4 (four) hours as needed for moderate pain. Reported on 04/27/2016    . polyethylene glycol (MIRALAX / GLYCOLAX) packet Take 17 g by mouth daily as needed for mild constipation.    . traMADol (ULTRAM) 50 MG tablet Take 50 mg by mouth every 6 (six) hours as needed for moderate pain.     No current facility-administered medications for this visit.     REVIEW OF SYSTEMS:  [X] denotes positive finding, [ ]  denotes negative finding Cardiac  Comments:  Chest pain or chest pressure:    Shortness of breath upon exertion:    Short of breath when lying flat:    Irregular heart rhythm:        Vascular    Pain in calf, thigh, or hip brought on by ambulation:    Pain in feet at night that wakes you up from your sleep:     Blood clot in your veins:    Leg swelling:           PHYSICAL EXAM: Vitals:   07/12/17 0941 07/12/17 0942  BP: (!) 146/90 (!) 150/89  Pulse: 69 69  Resp: 16   Temp: (!) 97 F (36.1 C)   SpO2: 95%     Weight: 167 lb (75.8 kg)   Height: 5' 8" (1.727 m)     GENERAL: The patient is a well-nourished female, in no acute distress. The vital signs are documented above. CARDIOVASCULAR: 2+ radial 2+ popliteal and 2+ dorsalis pedis pulses Abdominal exam is nontender and I do not palpate an aneurysm PULMONARY: There is good air exchange  MUSCULOSKELETAL: There are no major deformities or cyanosis. NEUROLOGIC: No focal weakness or paresthesias are detected. SKIN: There are no ulcers or rashes noted. PSYCHIATRIC: The patient has a normal affect.  DATA:  Ultrasound today was reviewed with the patient and her daughter. Maximal diameter by ultrasound is 4.8 cm. This compares to maximal diameter 5.2 by CT scan in April 2017  MEDICAL ISSUES: Had long discussion with patient regarding symptoms of leaking aneurysm. Explained that she was at low risk of this with her current size. Have recommended continued ultrasound follow-up. We'll see her again in one year for repeat ultrasound.    Rosetta Posner, MD FACS [facility] Tel [phone] Pager [phone]

## 2017-07-18 ENCOUNTER — Other Ambulatory Visit: Payer: Self-pay

## 2017-07-25 NOTE — Addendum Note (Signed)
Addended by: Lianne Cure A on: 07/25/2017 04:24 PM   Modules accepted: Orders

## 2017-08-24 ENCOUNTER — Ambulatory Visit: Payer: Medicare Other | Admitting: Podiatry

## 2017-08-25 ENCOUNTER — Encounter: Payer: Self-pay | Admitting: Podiatry

## 2017-08-25 ENCOUNTER — Ambulatory Visit (INDEPENDENT_AMBULATORY_CARE_PROVIDER_SITE_OTHER): Payer: Medicare Other | Admitting: Podiatry

## 2017-08-25 DIAGNOSIS — E0842 Diabetes mellitus due to underlying condition with diabetic polyneuropathy: Secondary | ICD-10-CM | POA: Diagnosis not present

## 2017-08-25 DIAGNOSIS — B351 Tinea unguium: Secondary | ICD-10-CM | POA: Diagnosis not present

## 2017-08-25 DIAGNOSIS — M79671 Pain in right foot: Secondary | ICD-10-CM

## 2017-08-25 DIAGNOSIS — M79672 Pain in left foot: Secondary | ICD-10-CM | POA: Diagnosis not present

## 2017-08-25 NOTE — Progress Notes (Signed)
Subjective: 83 y.o. year old female presents requesting diabetic shoes and toe nails trimmed.  Denies any new problems.   OBJECTIVE: DERMATOLOGIC EXAMINATION:  All nails are thick dystrophic yellow nails x 10.  Thick ingrown nail right medial border with pain.  No abnormal skin lesions noted. VASCULAR EXAMINATION OF LOWER LIMBS: All pedal pulses are palpable with normal pulsation. Capillary Filling times within 3 seconds in all digits.  No edema or erythema, no ischemic signs noted.  NEUROLOGIC EXAMINATION OF THE LOWER LIMBS: Diagnosed with peripheral neuropathy. MUSCULOSKELETAL EXAMINATION: Flattened mid arch bilateral. Excess abduction and pronation of Subtalar joint bilateral. Hammer toe deformities 2nd and 3rd left>right.  ASSESSMENT: Diabetic neuropathy bilateral. Subluxed mid tarsal joint (Talonavicular complex) bilateral. Hammer toe deformities. Onychomycosis bilateral. Pain in foot bilateral.  PLAN: All nails debrided. Patient is to bring PCP certification form to have diabetic shoes ordered.  Return in 3 months

## 2017-08-25 NOTE — Patient Instructions (Addendum)
Seen for hypertrophic nails. All nails debrided. May benefit from diabetic shoes to accommodate hammer toe deformity. Return in 3 months or as needed.

## 2017-09-01 DIAGNOSIS — R8279 Other abnormal findings on microbiological examination of urine: Secondary | ICD-10-CM | POA: Diagnosis not present

## 2017-09-01 DIAGNOSIS — I1310 Hypertensive heart and chronic kidney disease without heart failure, with stage 1 through stage 4 chronic kidney disease, or unspecified chronic kidney disease: Secondary | ICD-10-CM | POA: Diagnosis not present

## 2017-09-01 DIAGNOSIS — E559 Vitamin D deficiency, unspecified: Secondary | ICD-10-CM | POA: Diagnosis not present

## 2017-09-01 DIAGNOSIS — I700 Atherosclerosis of aorta: Secondary | ICD-10-CM | POA: Diagnosis not present

## 2017-09-01 DIAGNOSIS — E1122 Type 2 diabetes mellitus with diabetic chronic kidney disease: Secondary | ICD-10-CM | POA: Diagnosis not present

## 2017-09-01 DIAGNOSIS — Z0000 Encounter for general adult medical examination without abnormal findings: Secondary | ICD-10-CM | POA: Diagnosis not present

## 2017-09-01 DIAGNOSIS — N08 Glomerular disorders in diseases classified elsewhere: Secondary | ICD-10-CM | POA: Diagnosis not present

## 2017-09-01 DIAGNOSIS — N182 Chronic kidney disease, stage 2 (mild): Secondary | ICD-10-CM | POA: Diagnosis not present

## 2017-09-09 DIAGNOSIS — E1121 Type 2 diabetes mellitus with diabetic nephropathy: Secondary | ICD-10-CM | POA: Diagnosis not present

## 2017-09-09 DIAGNOSIS — Z87891 Personal history of nicotine dependence: Secondary | ICD-10-CM | POA: Diagnosis not present

## 2017-09-09 DIAGNOSIS — M6281 Muscle weakness (generalized): Secondary | ICD-10-CM | POA: Diagnosis not present

## 2017-09-09 DIAGNOSIS — Z96651 Presence of right artificial knee joint: Secondary | ICD-10-CM | POA: Diagnosis not present

## 2017-09-09 DIAGNOSIS — E1122 Type 2 diabetes mellitus with diabetic chronic kidney disease: Secondary | ICD-10-CM | POA: Diagnosis not present

## 2017-09-09 DIAGNOSIS — D631 Anemia in chronic kidney disease: Secondary | ICD-10-CM | POA: Diagnosis not present

## 2017-09-09 DIAGNOSIS — I129 Hypertensive chronic kidney disease with stage 1 through stage 4 chronic kidney disease, or unspecified chronic kidney disease: Secondary | ICD-10-CM | POA: Diagnosis not present

## 2017-09-09 DIAGNOSIS — I700 Atherosclerosis of aorta: Secondary | ICD-10-CM | POA: Diagnosis not present

## 2017-09-09 DIAGNOSIS — N182 Chronic kidney disease, stage 2 (mild): Secondary | ICD-10-CM | POA: Diagnosis not present

## 2017-09-09 DIAGNOSIS — Z9181 History of falling: Secondary | ICD-10-CM | POA: Diagnosis not present

## 2017-09-14 DIAGNOSIS — E1122 Type 2 diabetes mellitus with diabetic chronic kidney disease: Secondary | ICD-10-CM | POA: Diagnosis not present

## 2017-09-14 DIAGNOSIS — E1121 Type 2 diabetes mellitus with diabetic nephropathy: Secondary | ICD-10-CM | POA: Diagnosis not present

## 2017-09-14 DIAGNOSIS — D631 Anemia in chronic kidney disease: Secondary | ICD-10-CM | POA: Diagnosis not present

## 2017-09-14 DIAGNOSIS — I129 Hypertensive chronic kidney disease with stage 1 through stage 4 chronic kidney disease, or unspecified chronic kidney disease: Secondary | ICD-10-CM | POA: Diagnosis not present

## 2017-09-14 DIAGNOSIS — N182 Chronic kidney disease, stage 2 (mild): Secondary | ICD-10-CM | POA: Diagnosis not present

## 2017-09-14 DIAGNOSIS — M6281 Muscle weakness (generalized): Secondary | ICD-10-CM | POA: Diagnosis not present

## 2017-09-16 DIAGNOSIS — M6281 Muscle weakness (generalized): Secondary | ICD-10-CM | POA: Diagnosis not present

## 2017-09-16 DIAGNOSIS — E1121 Type 2 diabetes mellitus with diabetic nephropathy: Secondary | ICD-10-CM | POA: Diagnosis not present

## 2017-09-16 DIAGNOSIS — D631 Anemia in chronic kidney disease: Secondary | ICD-10-CM | POA: Diagnosis not present

## 2017-09-16 DIAGNOSIS — I129 Hypertensive chronic kidney disease with stage 1 through stage 4 chronic kidney disease, or unspecified chronic kidney disease: Secondary | ICD-10-CM | POA: Diagnosis not present

## 2017-09-16 DIAGNOSIS — E1122 Type 2 diabetes mellitus with diabetic chronic kidney disease: Secondary | ICD-10-CM | POA: Diagnosis not present

## 2017-09-16 DIAGNOSIS — N182 Chronic kidney disease, stage 2 (mild): Secondary | ICD-10-CM | POA: Diagnosis not present

## 2017-09-20 ENCOUNTER — Ambulatory Visit: Payer: Medicare Other | Admitting: Podiatry

## 2017-09-20 DIAGNOSIS — M6281 Muscle weakness (generalized): Secondary | ICD-10-CM | POA: Diagnosis not present

## 2017-09-20 DIAGNOSIS — D631 Anemia in chronic kidney disease: Secondary | ICD-10-CM | POA: Diagnosis not present

## 2017-09-20 DIAGNOSIS — I129 Hypertensive chronic kidney disease with stage 1 through stage 4 chronic kidney disease, or unspecified chronic kidney disease: Secondary | ICD-10-CM | POA: Diagnosis not present

## 2017-09-20 DIAGNOSIS — E1122 Type 2 diabetes mellitus with diabetic chronic kidney disease: Secondary | ICD-10-CM | POA: Diagnosis not present

## 2017-09-20 DIAGNOSIS — E1121 Type 2 diabetes mellitus with diabetic nephropathy: Secondary | ICD-10-CM | POA: Diagnosis not present

## 2017-09-20 DIAGNOSIS — N182 Chronic kidney disease, stage 2 (mild): Secondary | ICD-10-CM | POA: Diagnosis not present

## 2017-09-26 DIAGNOSIS — D631 Anemia in chronic kidney disease: Secondary | ICD-10-CM | POA: Diagnosis not present

## 2017-09-26 DIAGNOSIS — E1122 Type 2 diabetes mellitus with diabetic chronic kidney disease: Secondary | ICD-10-CM | POA: Diagnosis not present

## 2017-09-26 DIAGNOSIS — E1121 Type 2 diabetes mellitus with diabetic nephropathy: Secondary | ICD-10-CM | POA: Diagnosis not present

## 2017-09-26 DIAGNOSIS — M6281 Muscle weakness (generalized): Secondary | ICD-10-CM | POA: Diagnosis not present

## 2017-09-26 DIAGNOSIS — I129 Hypertensive chronic kidney disease with stage 1 through stage 4 chronic kidney disease, or unspecified chronic kidney disease: Secondary | ICD-10-CM | POA: Diagnosis not present

## 2017-09-26 DIAGNOSIS — N182 Chronic kidney disease, stage 2 (mild): Secondary | ICD-10-CM | POA: Diagnosis not present

## 2017-09-27 DIAGNOSIS — M6281 Muscle weakness (generalized): Secondary | ICD-10-CM | POA: Diagnosis not present

## 2017-09-27 DIAGNOSIS — N182 Chronic kidney disease, stage 2 (mild): Secondary | ICD-10-CM | POA: Diagnosis not present

## 2017-09-27 DIAGNOSIS — E1122 Type 2 diabetes mellitus with diabetic chronic kidney disease: Secondary | ICD-10-CM | POA: Diagnosis not present

## 2017-09-27 DIAGNOSIS — I129 Hypertensive chronic kidney disease with stage 1 through stage 4 chronic kidney disease, or unspecified chronic kidney disease: Secondary | ICD-10-CM | POA: Diagnosis not present

## 2017-09-27 DIAGNOSIS — D631 Anemia in chronic kidney disease: Secondary | ICD-10-CM | POA: Diagnosis not present

## 2017-09-27 DIAGNOSIS — E1121 Type 2 diabetes mellitus with diabetic nephropathy: Secondary | ICD-10-CM | POA: Diagnosis not present

## 2017-10-03 DIAGNOSIS — E119 Type 2 diabetes mellitus without complications: Secondary | ICD-10-CM | POA: Diagnosis not present

## 2017-10-03 DIAGNOSIS — H43813 Vitreous degeneration, bilateral: Secondary | ICD-10-CM | POA: Diagnosis not present

## 2017-10-03 DIAGNOSIS — Z961 Presence of intraocular lens: Secondary | ICD-10-CM | POA: Diagnosis not present

## 2017-10-03 DIAGNOSIS — H524 Presbyopia: Secondary | ICD-10-CM | POA: Diagnosis not present

## 2017-10-05 DIAGNOSIS — E1122 Type 2 diabetes mellitus with diabetic chronic kidney disease: Secondary | ICD-10-CM | POA: Diagnosis not present

## 2017-10-05 DIAGNOSIS — I129 Hypertensive chronic kidney disease with stage 1 through stage 4 chronic kidney disease, or unspecified chronic kidney disease: Secondary | ICD-10-CM | POA: Diagnosis not present

## 2017-10-05 DIAGNOSIS — M6281 Muscle weakness (generalized): Secondary | ICD-10-CM | POA: Diagnosis not present

## 2017-10-05 DIAGNOSIS — E1121 Type 2 diabetes mellitus with diabetic nephropathy: Secondary | ICD-10-CM | POA: Diagnosis not present

## 2017-10-05 DIAGNOSIS — D631 Anemia in chronic kidney disease: Secondary | ICD-10-CM | POA: Diagnosis not present

## 2017-10-05 DIAGNOSIS — N182 Chronic kidney disease, stage 2 (mild): Secondary | ICD-10-CM | POA: Diagnosis not present

## 2017-10-07 DIAGNOSIS — E1122 Type 2 diabetes mellitus with diabetic chronic kidney disease: Secondary | ICD-10-CM | POA: Diagnosis not present

## 2017-10-07 DIAGNOSIS — N182 Chronic kidney disease, stage 2 (mild): Secondary | ICD-10-CM | POA: Diagnosis not present

## 2017-10-07 DIAGNOSIS — D631 Anemia in chronic kidney disease: Secondary | ICD-10-CM | POA: Diagnosis not present

## 2017-10-07 DIAGNOSIS — I129 Hypertensive chronic kidney disease with stage 1 through stage 4 chronic kidney disease, or unspecified chronic kidney disease: Secondary | ICD-10-CM | POA: Diagnosis not present

## 2017-10-07 DIAGNOSIS — M6281 Muscle weakness (generalized): Secondary | ICD-10-CM | POA: Diagnosis not present

## 2017-10-07 DIAGNOSIS — E1121 Type 2 diabetes mellitus with diabetic nephropathy: Secondary | ICD-10-CM | POA: Diagnosis not present

## 2017-10-07 DIAGNOSIS — Z23 Encounter for immunization: Secondary | ICD-10-CM | POA: Diagnosis not present

## 2017-10-11 DIAGNOSIS — M6281 Muscle weakness (generalized): Secondary | ICD-10-CM | POA: Diagnosis not present

## 2017-10-11 DIAGNOSIS — E1121 Type 2 diabetes mellitus with diabetic nephropathy: Secondary | ICD-10-CM | POA: Diagnosis not present

## 2017-10-11 DIAGNOSIS — E1122 Type 2 diabetes mellitus with diabetic chronic kidney disease: Secondary | ICD-10-CM | POA: Diagnosis not present

## 2017-10-11 DIAGNOSIS — D631 Anemia in chronic kidney disease: Secondary | ICD-10-CM | POA: Diagnosis not present

## 2017-10-11 DIAGNOSIS — N182 Chronic kidney disease, stage 2 (mild): Secondary | ICD-10-CM | POA: Diagnosis not present

## 2017-10-11 DIAGNOSIS — I129 Hypertensive chronic kidney disease with stage 1 through stage 4 chronic kidney disease, or unspecified chronic kidney disease: Secondary | ICD-10-CM | POA: Diagnosis not present

## 2017-10-12 DIAGNOSIS — I129 Hypertensive chronic kidney disease with stage 1 through stage 4 chronic kidney disease, or unspecified chronic kidney disease: Secondary | ICD-10-CM | POA: Diagnosis not present

## 2017-10-12 DIAGNOSIS — M6281 Muscle weakness (generalized): Secondary | ICD-10-CM | POA: Diagnosis not present

## 2017-10-12 DIAGNOSIS — E1122 Type 2 diabetes mellitus with diabetic chronic kidney disease: Secondary | ICD-10-CM | POA: Diagnosis not present

## 2017-10-12 DIAGNOSIS — E1121 Type 2 diabetes mellitus with diabetic nephropathy: Secondary | ICD-10-CM | POA: Diagnosis not present

## 2017-10-12 DIAGNOSIS — D631 Anemia in chronic kidney disease: Secondary | ICD-10-CM | POA: Diagnosis not present

## 2017-10-12 DIAGNOSIS — N182 Chronic kidney disease, stage 2 (mild): Secondary | ICD-10-CM | POA: Diagnosis not present

## 2017-10-17 DIAGNOSIS — E1121 Type 2 diabetes mellitus with diabetic nephropathy: Secondary | ICD-10-CM | POA: Diagnosis not present

## 2017-10-17 DIAGNOSIS — M6281 Muscle weakness (generalized): Secondary | ICD-10-CM | POA: Diagnosis not present

## 2017-10-17 DIAGNOSIS — N182 Chronic kidney disease, stage 2 (mild): Secondary | ICD-10-CM | POA: Diagnosis not present

## 2017-10-17 DIAGNOSIS — E1122 Type 2 diabetes mellitus with diabetic chronic kidney disease: Secondary | ICD-10-CM | POA: Diagnosis not present

## 2017-10-17 DIAGNOSIS — D631 Anemia in chronic kidney disease: Secondary | ICD-10-CM | POA: Diagnosis not present

## 2017-10-17 DIAGNOSIS — I129 Hypertensive chronic kidney disease with stage 1 through stage 4 chronic kidney disease, or unspecified chronic kidney disease: Secondary | ICD-10-CM | POA: Diagnosis not present

## 2017-10-19 DIAGNOSIS — N182 Chronic kidney disease, stage 2 (mild): Secondary | ICD-10-CM | POA: Diagnosis not present

## 2017-10-19 DIAGNOSIS — E1122 Type 2 diabetes mellitus with diabetic chronic kidney disease: Secondary | ICD-10-CM | POA: Diagnosis not present

## 2017-10-19 DIAGNOSIS — I129 Hypertensive chronic kidney disease with stage 1 through stage 4 chronic kidney disease, or unspecified chronic kidney disease: Secondary | ICD-10-CM | POA: Diagnosis not present

## 2017-10-19 DIAGNOSIS — M6281 Muscle weakness (generalized): Secondary | ICD-10-CM | POA: Diagnosis not present

## 2017-10-19 DIAGNOSIS — E1121 Type 2 diabetes mellitus with diabetic nephropathy: Secondary | ICD-10-CM | POA: Diagnosis not present

## 2017-10-19 DIAGNOSIS — D631 Anemia in chronic kidney disease: Secondary | ICD-10-CM | POA: Diagnosis not present

## 2017-10-24 DIAGNOSIS — E1121 Type 2 diabetes mellitus with diabetic nephropathy: Secondary | ICD-10-CM | POA: Diagnosis not present

## 2017-10-24 DIAGNOSIS — M6281 Muscle weakness (generalized): Secondary | ICD-10-CM | POA: Diagnosis not present

## 2017-10-24 DIAGNOSIS — D631 Anemia in chronic kidney disease: Secondary | ICD-10-CM | POA: Diagnosis not present

## 2017-10-24 DIAGNOSIS — I129 Hypertensive chronic kidney disease with stage 1 through stage 4 chronic kidney disease, or unspecified chronic kidney disease: Secondary | ICD-10-CM | POA: Diagnosis not present

## 2017-10-24 DIAGNOSIS — E1122 Type 2 diabetes mellitus with diabetic chronic kidney disease: Secondary | ICD-10-CM | POA: Diagnosis not present

## 2017-10-24 DIAGNOSIS — N182 Chronic kidney disease, stage 2 (mild): Secondary | ICD-10-CM | POA: Diagnosis not present

## 2017-10-26 DIAGNOSIS — E1121 Type 2 diabetes mellitus with diabetic nephropathy: Secondary | ICD-10-CM | POA: Diagnosis not present

## 2017-10-26 DIAGNOSIS — E1122 Type 2 diabetes mellitus with diabetic chronic kidney disease: Secondary | ICD-10-CM | POA: Diagnosis not present

## 2017-10-26 DIAGNOSIS — I129 Hypertensive chronic kidney disease with stage 1 through stage 4 chronic kidney disease, or unspecified chronic kidney disease: Secondary | ICD-10-CM | POA: Diagnosis not present

## 2017-10-26 DIAGNOSIS — M6281 Muscle weakness (generalized): Secondary | ICD-10-CM | POA: Diagnosis not present

## 2017-10-26 DIAGNOSIS — N182 Chronic kidney disease, stage 2 (mild): Secondary | ICD-10-CM | POA: Diagnosis not present

## 2017-10-26 DIAGNOSIS — D631 Anemia in chronic kidney disease: Secondary | ICD-10-CM | POA: Diagnosis not present

## 2017-11-24 ENCOUNTER — Ambulatory Visit: Payer: Medicare Other | Admitting: Podiatry

## 2017-11-29 ENCOUNTER — Encounter: Payer: Self-pay | Admitting: Podiatry

## 2017-11-29 ENCOUNTER — Ambulatory Visit (INDEPENDENT_AMBULATORY_CARE_PROVIDER_SITE_OTHER): Payer: Medicare Other | Admitting: Podiatry

## 2017-11-29 DIAGNOSIS — E0842 Diabetes mellitus due to underlying condition with diabetic polyneuropathy: Secondary | ICD-10-CM | POA: Diagnosis not present

## 2017-11-29 DIAGNOSIS — B351 Tinea unguium: Secondary | ICD-10-CM | POA: Diagnosis not present

## 2017-11-29 NOTE — Patient Instructions (Signed)
Seen for hypertrophic nails. All nails debrided. Return in 3 months or as needed.  

## 2017-11-29 NOTE — Progress Notes (Signed)
Subjective: 83 y.o. year old female patient presents complaining of painful nails. Patient requests toe nails trimmed.  Denies any new problems. Walks with cane. Patient is wearing diabetic shoes and doing well.  Objective: Dermatologic: Thick yellow deformed nails x 10. Vascular: Pedal pulses are all palpable. Orthopedic: Contracted digits with hammer toe 2-3 L>R. Flat instep arch with excess pronating STJ bilateral. Neurologic: Diagnosed with peripheral neuropathy.  Assessment: Dystrophic mycotic nails x 10. Diabetic neuropathy bilateral.  Subluxed midtarsal joint bilateral.  Treatment: All mycotic nails debrided.  Return in 3 months or as needed.

## 2017-12-23 DIAGNOSIS — M5136 Other intervertebral disc degeneration, lumbar region: Secondary | ICD-10-CM | POA: Diagnosis not present

## 2018-01-04 DIAGNOSIS — N08 Glomerular disorders in diseases classified elsewhere: Secondary | ICD-10-CM | POA: Diagnosis not present

## 2018-01-04 DIAGNOSIS — I1310 Hypertensive heart and chronic kidney disease without heart failure, with stage 1 through stage 4 chronic kidney disease, or unspecified chronic kidney disease: Secondary | ICD-10-CM | POA: Diagnosis not present

## 2018-01-04 DIAGNOSIS — R350 Frequency of micturition: Secondary | ICD-10-CM | POA: Diagnosis not present

## 2018-01-04 DIAGNOSIS — E1122 Type 2 diabetes mellitus with diabetic chronic kidney disease: Secondary | ICD-10-CM | POA: Diagnosis not present

## 2018-01-04 DIAGNOSIS — Z79899 Other long term (current) drug therapy: Secondary | ICD-10-CM | POA: Diagnosis not present

## 2018-01-04 DIAGNOSIS — N182 Chronic kidney disease, stage 2 (mild): Secondary | ICD-10-CM | POA: Diagnosis not present

## 2018-02-27 ENCOUNTER — Ambulatory Visit: Payer: Medicare Other | Admitting: Podiatry

## 2018-03-21 ENCOUNTER — Ambulatory Visit (INDEPENDENT_AMBULATORY_CARE_PROVIDER_SITE_OTHER): Payer: Medicare Other | Admitting: Podiatry

## 2018-03-21 ENCOUNTER — Encounter: Payer: Self-pay | Admitting: Podiatry

## 2018-03-21 DIAGNOSIS — M79671 Pain in right foot: Secondary | ICD-10-CM

## 2018-03-21 DIAGNOSIS — E0842 Diabetes mellitus due to underlying condition with diabetic polyneuropathy: Secondary | ICD-10-CM | POA: Diagnosis not present

## 2018-03-21 DIAGNOSIS — M79672 Pain in left foot: Secondary | ICD-10-CM

## 2018-03-21 DIAGNOSIS — B351 Tinea unguium: Secondary | ICD-10-CM | POA: Diagnosis not present

## 2018-03-21 NOTE — Patient Instructions (Signed)
Seen for hypertrophic nails. All nails debrided. Return in 3 months or as needed.  

## 2018-03-21 NOTE — Progress Notes (Signed)
Subjective: 84 y.o. year old female patient presents complaining of painful nails. Patient requests toe nails trimmed.  Doing well with diabetic shoes and denies any new problems.  Objective: Dermatologic: Thick yellow deformed nails x 10. Ingrown and deformed right great toe medial border. Vascular: Pedal pulses are all palpable. Orthopedic: Contracted lesser digits  2-3 left more severe. Flat arch with excess STJ pronation bilateral. Neurologic: Diagnosed with Diabetic neuropathy.  Assessment: Dystrophic mycotic nails x 10. Ingrown right hallucal nail. Diabetic neuropathy. Subluxed midtarsal joint bilateral.  Treatment: All mycotic nails debrided.  Return in 3 months or as needed.

## 2018-05-10 DIAGNOSIS — N182 Chronic kidney disease, stage 2 (mild): Secondary | ICD-10-CM | POA: Diagnosis not present

## 2018-05-10 DIAGNOSIS — I129 Hypertensive chronic kidney disease with stage 1 through stage 4 chronic kidney disease, or unspecified chronic kidney disease: Secondary | ICD-10-CM | POA: Diagnosis not present

## 2018-05-10 DIAGNOSIS — N08 Glomerular disorders in diseases classified elsewhere: Secondary | ICD-10-CM | POA: Diagnosis not present

## 2018-05-10 DIAGNOSIS — E1122 Type 2 diabetes mellitus with diabetic chronic kidney disease: Secondary | ICD-10-CM | POA: Diagnosis not present

## 2018-05-10 LAB — HEMOGLOBIN A1C: Hemoglobin A1C: 6.1

## 2018-05-10 LAB — LIPID PANEL
Cholesterol: 168 (ref 0–200)
HDL: 54 (ref 35–70)
LDL Cholesterol: 97
LDl/HDL Ratio: 1.8
Triglycerides: 85 (ref 40–160)

## 2018-05-10 LAB — BASIC METABOLIC PANEL
Creatinine: 0.7 (ref 0.5–1.1)
Glucose: 113
Potassium: 4.5 (ref 3.4–5.3)
Sodium: 142 (ref 137–147)

## 2018-06-21 ENCOUNTER — Ambulatory Visit: Payer: Medicare Other | Admitting: Podiatry

## 2018-07-04 ENCOUNTER — Ambulatory Visit: Payer: Medicare Other | Admitting: Podiatry

## 2018-07-10 ENCOUNTER — Ambulatory Visit (INDEPENDENT_AMBULATORY_CARE_PROVIDER_SITE_OTHER): Payer: Medicare Other | Admitting: Podiatry

## 2018-07-10 ENCOUNTER — Encounter: Payer: Self-pay | Admitting: Podiatry

## 2018-07-10 DIAGNOSIS — M79671 Pain in right foot: Secondary | ICD-10-CM

## 2018-07-10 DIAGNOSIS — E0842 Diabetes mellitus due to underlying condition with diabetic polyneuropathy: Secondary | ICD-10-CM

## 2018-07-10 DIAGNOSIS — B351 Tinea unguium: Secondary | ICD-10-CM

## 2018-07-10 DIAGNOSIS — M79672 Pain in left foot: Secondary | ICD-10-CM | POA: Diagnosis not present

## 2018-07-10 NOTE — Progress Notes (Signed)
Subjective: 84 y.o. year old female patient presents complaining of painful nails. Patient requests toe nails trimmed. Right great toe nail at medial border is ingrown and painful. Patient is alert ambulatory and uses no walking aid.  Objective: Dermatologic: Thick yellow deformed nails x 10. Ingrown hallucal nail right great toe without infection. Vascular: Pedal pulses are all palpable. Orthopedic: No gross deformities. Neurologic: Diagnosed with diabetic poly neuropathy.  Assessment: Dystrophic mycotic nails x 10. Ingrown right great toe. Pain in both feet.  Treatment: All mycotic nails debrided.  Return in 3 months or as needed.

## 2018-07-10 NOTE — Patient Instructions (Signed)
Seen for hypertrophic nails. All nails debrided. Return in 3 months or as needed.  

## 2018-07-18 ENCOUNTER — Ambulatory Visit: Payer: Medicare Other | Admitting: Family

## 2018-07-18 ENCOUNTER — Other Ambulatory Visit (HOSPITAL_COMMUNITY): Payer: Medicare Other

## 2018-08-22 ENCOUNTER — Other Ambulatory Visit: Payer: Self-pay

## 2018-08-22 ENCOUNTER — Ambulatory Visit (HOSPITAL_COMMUNITY)
Admission: RE | Admit: 2018-08-22 | Discharge: 2018-08-22 | Disposition: A | Discharge status: Home / Self Care | Payer: Medicare Other | Source: Ambulatory Visit | Attending: Vascular Surgery | Admitting: Vascular Surgery

## 2018-08-22 ENCOUNTER — Encounter: Payer: Self-pay | Admitting: Family

## 2018-08-22 ENCOUNTER — Ambulatory Visit (INDEPENDENT_AMBULATORY_CARE_PROVIDER_SITE_OTHER): Payer: Medicare Other | Admitting: Family

## 2018-08-22 VITALS — BP 139/82 | HR 66 | Temp 97.6°F | Resp 16 | Ht 68.0 in | Wt 175.0 lb

## 2018-08-22 DIAGNOSIS — I714 Abdominal aortic aneurysm, without rupture, unspecified: Secondary | ICD-10-CM

## 2018-08-22 NOTE — Patient Instructions (Signed)
Abdominal Aortic Aneurysm Blood pumps away from the heart through tubes (blood vessels) called arteries. Aneurysms are weak or damaged places in the wall of an artery. It bulges out like a balloon. An abdominal aortic aneurysm happens in the main artery of the body (aorta). It can burst or tear, causing bleeding inside the body. This is an emergency. It needs treatment right away. What are the causes? The exact cause is unknown. Things that could cause this problem include:  Fat and other substances building up in the lining of a tube.  Swelling of the walls of a blood vessel.  Certain tissue diseases.  Belly (abdominal) trauma.  An infection in the main artery of the body.  What increases the risk? There are things that make it more likely for you to have an aneurysm. These include:  Being over the age of 82 years old.  Having high blood pressure (hypertension).  Being a female.  Being white.  Being very overweight (obese).  Having a family history of aneurysm.  Using tobacco products.  What are the signs or symptoms? Symptoms depend on the size of the aneurysm and how fast it grows. There may not be symptoms. If symptoms occur, they can include:  Pain (belly, side, lower back, or groin).  Feeling full after eating a small amount of food.  Feeling sick to your stomach (nauseous), throwing up (vomiting), or both.  Feeling a lump in your belly that feels like it is beating (pulsating).  Feeling like you will pass out (faint).  How is this treated?  Medicine to control blood pressure and pain.  Imaging tests to see if the aneurysm gets bigger.  Surgery. How is this prevented? To lessen your chance of getting this condition:  Stop smoking. Stop chewing tobacco.  Limit or avoid alcohol.  Keep your blood pressure, blood sugar, and cholesterol within normal limits.  Eat less salt.  Eat foods low in saturated fats and cholesterol. These are found in animal and  whole dairy products.  Eat more fiber. Fiber is found in whole grains, vegetables, and fruits.  Keep a healthy weight.  Stay active and exercise often.  This information is not intended to replace advice given to you by your health care provider. Make sure you discuss any questions you have with your health care provider. Document Released: 04/09/2013 Document Revised: 05/20/2016 Document Reviewed: 01/12/2013 Elsevier Interactive Patient Education  2017 [employer]  

## 2018-08-22 NOTE — Progress Notes (Signed)
[facility] [place]   CC: Follow up Abdominal Aortic Aneurysm  History of Present Illness  Molly Meyer is a 84 y.o. ([birth year]) female who presents with chief complaint: follow up for AAA.    Dr. Donnetta Hutching last evaluated pt on 07-12-17. At that time ultrasound was reviewed with the patient and her daughter. Maximal diameter by ultrasound was 4.8 cm. This compares to maximal diameter 5.2 by CT scan in April 2017. Dr. Donnetta Hutching had a long discussion with patient regarding symptoms of leaking aneurysm. Explained that she was at low risk of this with the AAA size at that time. Dr. Donnetta Hutching recommended continued ultrasound follow-up, return in one year for repeat ultrasound.  The patient denies claudication type symptoms in her legs with walking. The patient denies any history of stroke or TIA symptoms, she denies any history of cardiac problems.   Her primary impediment to walking is the painful arthritis in her feet and ankles, and many joints.  She reports the chronic back pain, no new back pain, no abdominal pain.  She has a spinal stimulator to help with this pain.   Diabetic: Yes, metformin as her only glycemic lowering agent Tobacco use: non-smoker  Past Medical History:  Diagnosis Date  . Degenerative disc disease, lumbar   . Diabetes mellitus without complication ([place])   . Hypertension    Past Surgical History:  Procedure Laterality Date  . KNEE SURGERY     Social History Social History   Socioeconomic History  . Marital status: Widowed    Spouse name: Not on file  . Number of children: Not on file  . Years of education: Not on file  . Highest education level: Not on file  Occupational History  . Not on file  Social Needs  . Financial resource strain: Not on file  . Food insecurity:    Worry: Not on file    Inability: Not on file  . Transportation needs:    Medical: Not on file    Non-medical: Not on file  Tobacco Use  . Smoking status: Never  Smoker  . Smokeless tobacco: Never Used  Substance and Sexual Activity  . Alcohol use: No    Alcohol/week: 0.0 standard drinks  . Drug use: Not on file  . Sexual activity: Not on file  Lifestyle  . Physical activity:    Days per week: Not on file    Minutes per session: Not on file  . Stress: Not on file  Relationships  . Social connections:    Talks on phone: Not on file    Gets together: Not on file    Attends religious service: Not on file    Active member of club or organization: Not on file    Attends meetings of clubs or organizations: Not on file    Relationship status: Not on file  . Intimate partner violence:    Fear of current or ex partner: Not on file    Emotionally abused: Not on file    Physically abused: Not on file    Forced sexual activity: Not on file  Other Topics Concern  . Not on file  Social History Narrative  . Not on file   Family History History reviewed. No pertinent family history.  Current Outpatient Medications on File Prior to Visit  Medication Sig Dispense Refill  . acetaminophen (TYLENOL) 500 MG tablet Take 2 tablets (1,000 mg total) by mouth every 6 (six) hours as needed for mild pain  or moderate pain. 30 tablet 0  . Cholecalciferol (VITAMIN D3) 2000 UNITS capsule Take 2,000 Units by mouth daily. 1 qd    . docusate sodium (COLACE) 100 MG capsule Take 100 mg by mouth daily as needed for mild constipation.    Marland Kitchen glucose blood (ACCU-CHEK SMARTVIEW) test strip CHECK BLOOD SUGAR ONCE BEFORE BREAKFAST AND DINNER    . Lancets Misc. (ACCU-CHEK FASTCLIX LANCET) KIT USE TO CHECK BS DAILY, DX: 250.02    . lisinopril-hydrochlorothiazide (PRINZIDE,ZESTORETIC) 10-12.5 MG per tablet Take 1 tablet by mouth daily.    . simvastatin (ZOCOR) 20 MG tablet TAKE 1 TABLET BY MOUTH ONCE EVERY EVENING  0  . Lactobacillus (ACIDOPHILUS PROBIOTIC) 10 MG TABS Take 10 mg by mouth 3 (three) times daily. (Patient not taking: Reported on 08/22/2018) 30 tablet 0  . lidocaine  (LIDODERM) 5 % Place 1 patch onto the skin daily. Remove & Discard patch within 12 hours or as directed by MD (Patient not taking: Reported on 08/22/2018) 30 patch 0  . metFORMIN (GLUCOPHAGE-XR) 500 MG 24 hr tablet TAKE ONE TABLET BY MOUTH EVERY DAY WITH DINNER.    . naproxen (NAPROSYN) 375 MG tablet Take 1 tablet (375 mg total) by mouth 2 (two) times daily with a meal. (Patient not taking: Reported on 08/22/2018) 14 tablet 0  . oxyCODONE-acetaminophen (PERCOCET/ROXICET) 5-325 MG tablet Take 1 tablet by mouth every 4 (four) hours as needed for moderate pain. Reported on 04/27/2016    . polyethylene glycol (MIRALAX / GLYCOLAX) packet Take 17 g by mouth daily as needed for mild constipation.    . pravastatin (PRAVACHOL) 40 MG tablet Take 40 mg by mouth daily.     . traMADol (ULTRAM) 50 MG tablet Take 50 mg by mouth every 6 (six) hours as needed for moderate pain.     No current facility-administered medications on file prior to visit.    Allergies  Allergen Reactions  . Penicillins Rash    Thinks she may be able to take some forms now.Has patient had a PCN reaction causing immediate rash, facial/tongue/throat swelling, SOB or lightheadedness with hypotension: No Has patient had a PCN reaction causing severe rash involving mucus membranes or skin necrosis: No Has patient had a PCN reaction that required hospitalization No Has patient had a PCN reaction occurring within the last 10 years: No If all of the above answers are "NO", then may proceed with Cephalosporin use.    ROS: See HPI for pertinent positives and negatives.  Physical Examination  Vitals:   08/22/18 0835 08/22/18 0838  BP: (!) 147/87 139/82  Pulse: 71 66  Resp: 16   Temp: 97.6 F (36.4 C)   TempSrc: Oral   SpO2: 95%   Weight: 175 lb (79.4 kg)   Height: _0  (1.727 m)    Body mass index is 26.61 kg/m.  General: A&O x 3, WD, well appearing elderly female. Gait: ambulatory with cane, slow, steady HEENT: Grossly intact  and WNL.  Pulmonary: Sym exp, respirations are non labored, good air movement in all fileds, CTAB, no rales, rhonchi, or wheezing. Cardiac: Regular rhythm and rate, no detected murmur.  Carotid Bruits Right Left   Negative Negative   Adominal aortic pulse is palpable on exoiration Radial pulses are 2+ palpable                          VASCULAR EXAM:  LE Pulses Right Left       FEMORAL  2+ palpable  2+ palpable        POPLITEAL  3+ palpable   1+ palpable       POSTERIOR TIBIAL  2+ palpable   2+ palpable        DORSALIS PEDIS      ANTERIOR TIBIAL 2+ palpable  2+ palpable     Gastrointestinal: soft, NTND, -G/R, - HSM, - masses palpated, - CVAT B. Musculoskeletal: M/S 4/5 throughout, Extremities without ischemic changes. Mild thoracic kyphosis.  Skin: No rashes, no ulcers, no cellulitis.   Neurologic: CN 2-12 intact except is hard of hearing, Pain and light touch intact in extremities are intact, Motor exam as listed above. Psychiatric: Normal thought content, mood appropriate to clinical situation.    DATA  AAA Duplex (08/22/2018):  Previous size: 4.8 cm (Date: 07-12-17)  Current size:  5.6 cm (Date: 08-22-18); Right CIA: 1.5 cm; Left CIA: 1.7 cm  Medical Decision Making  The patient is a 84 y.o. female who presents with asymptomatic AAA with increase in size to 5.6 cm from 4.8 cm in a year, 5.2 cm by CT in 2017.   I discussed with Dr. Donnetta Hutching pt asymptomatic status and size of AAA today, will schedule CTA abd/pelvis within the next 1-3 weeks, see Dr. Donnetta Hutching afterward.    Consideration for repair of AAA would be made when the size is 5.0 cm, growth > 1 cm/yr, and symptomatic status.        The patient was given information about AAA including signs, symptoms, treatment, and how to minimize the risk of enlargement and rupture of aneurysms.    I emphasized the importance of  maximal medical management including strict control of blood pressure, blood glucose, and lipid levels, antiplatelet agents, obtaining regular exercise, and continued  cessation of smoking.   The patient was advised to call 911 should the patient experience sudden onset abdominal or back pain.   Thank you for allowing [place] to participate in this patient's care.  Clemon Chambers, RN, MSN, FNP-C [facility] [place] Office: [phone]  Clinic Physician: Early  08/22/2018, 8:40 AM

## 2018-08-22 NOTE — Progress Notes (Signed)
Vitals:   08/22/18 0835  BP: (!) 147/87  Pulse: 71  Resp: 16  Temp: 97.6 F (36.4 C)  TempSrc: Oral  SpO2: 95%  Weight: 175 lb (79.4 kg)  Height: 5\' 8"  (1.727 m)

## 2018-09-12 ENCOUNTER — Other Ambulatory Visit: Payer: Self-pay

## 2018-09-12 ENCOUNTER — Encounter: Payer: Self-pay | Admitting: Vascular Surgery

## 2018-09-12 ENCOUNTER — Ambulatory Visit
Admission: RE | Admit: 2018-09-12 | Discharge: 2018-09-12 | Disposition: A | Discharge status: Home / Self Care | Payer: Medicare Other | Source: Ambulatory Visit | Attending: Vascular Surgery | Admitting: Vascular Surgery

## 2018-09-12 ENCOUNTER — Ambulatory Visit (INDEPENDENT_AMBULATORY_CARE_PROVIDER_SITE_OTHER): Payer: Medicare Other | Admitting: Vascular Surgery

## 2018-09-12 VITALS — BP 128/82 | HR 91 | Temp 98.6°F | Resp 16 | Ht 68.0 in | Wt 174.0 lb

## 2018-09-12 DIAGNOSIS — I714 Abdominal aortic aneurysm, without rupture, unspecified: Secondary | ICD-10-CM

## 2018-09-12 MED ORDER — IOPAMIDOL (ISOVUE-370) INJECTION 76%
75.0000 mL | Freq: Once | INTRAVENOUS | Status: AC | PRN
  Administered 2018-09-12: 75 mL via INTRAVENOUS

## 2018-09-12 NOTE — Progress Notes (Signed)
[facility] [place]  Patient name: Molly Meyer MRN: [id number] DOB: 1924-06-09 Sex: female  REASON FOR VISIT: Discuss recent CT follow-up of infrarenal abdominal aortic aneurysm  HPI: Molly Meyer is a 84 y.o. female here today with her daughter for discussion.  She has a known history of infrarenal abdominal aortic aneurysm.  Recent duplex suggests significant increase in size.  She has undergone a CT scan is here today for further evaluation and discussion of this.  She remains in extremely good health at 8.  She lives alone independently and is very active.  She does walk with a cane.  This is related to lumbar degenerative disc disease.  She has a sister who lived to be 28 and her younger sister is having issues with dementia at 29.  Past Medical History:  Diagnosis Date  . Degenerative disc disease, lumbar   . Diabetes mellitus without complication ([place])   . Hypertension     History reviewed. No pertinent family history.  SOCIAL HISTORY: Social History   Tobacco Use  . Smoking status: Never Smoker  . Smokeless tobacco: Never Used  Substance Use Topics  . Alcohol use: No    Alcohol/week: 0.0 standard drinks    Allergies  Allergen Reactions  . Penicillins Rash    Thinks she may be able to take some forms now.Has patient had a PCN reaction causing immediate rash, facial/tongue/throat swelling, SOB or lightheadedness with hypotension: No Has patient had a PCN reaction causing severe rash involving mucus membranes or skin necrosis: No Has patient had a PCN reaction that required hospitalization No Has patient had a PCN reaction occurring within the last 10 years: No If all of the above answers are "NO", then may proceed with Cephalosporin use.    Current Outpatient Medications  Medication Sig Dispense Refill  . acetaminophen (TYLENOL) 500 MG tablet Take 2 tablets (1,000 mg total) by mouth every 6 (six)  hours as needed for mild pain or moderate pain. 30 tablet 0  . Cholecalciferol (VITAMIN D3) 2000 UNITS capsule Take 2,000 Units by mouth daily. 1 qd    . docusate sodium (COLACE) 100 MG capsule Take 100 mg by mouth daily as needed for mild constipation.    Marland Kitchen glucose blood (ACCU-CHEK SMARTVIEW) test strip CHECK BLOOD SUGAR ONCE BEFORE BREAKFAST AND DINNER    . Lancets Misc. (ACCU-CHEK FASTCLIX LANCET) KIT USE TO CHECK BS DAILY, DX: 250.02    . lisinopril-hydrochlorothiazide (PRINZIDE,ZESTORETIC) 10-12.5 MG per tablet Take 1 tablet by mouth daily.    . simvastatin (ZOCOR) 20 MG tablet TAKE 1 TABLET BY MOUTH ONCE EVERY EVENING  0   No current facility-administered medications for this visit.     REVIEW OF SYSTEMS:  '[X]'$  denotes positive finding, '[ ]'$  denotes negative finding Cardiac  Comments:  Chest pain or chest pressure:    Shortness of breath upon exertion:    Short of breath when lying flat:    Irregular heart rhythm:        Vascular    Pain in calf, thigh, or hip brought on by ambulation:    Pain in feet at night that wakes you up from your sleep:     Blood clot in your veins:    Leg swelling:           PHYSICAL EXAM: Vitals:   09/12/18 1113  BP: 128/82  Pulse: 91  Resp: 16  Temp: 98.6 F (37 C)  TempSrc: Oral  SpO2:  94%  Weight: 174 lb (78.9 kg)  Height: '5\' 8"'$  (1.727 m)    GENERAL: The patient is a well-nourished female, in no acute distress. The vital signs are documented above. CARDIOVASCULAR: 2+ radial, 2+ femoral and 2+ popliteal pulses bilaterally.  I do not feel an aneurysm PULMONARY: There is good air exchange  MUSCULOSKELETAL: There are no major deformities or cyanosis. NEUROLOGIC: No focal weakness or paresthesias are detected. SKIN: There are no ulcers or rashes noted. PSYCHIATRIC: The patient has a normal affect.  DATA:  CT scan today revealed a maximal diameter of 5.1 cm infrarenal aneurysm.  This compares to her CT 2 years ago the maximal diameter 4.1  cm.  She does have a long infrarenal neck and normal caliber iliac and filled femoral vessels.  MEDICAL ISSUES: Had a very long discussion with the patient and her daughter present.  Explained the typical recommendation for women for aneurysm repair would be 5 cm.  Also explained the concern of slightly more rapid than expected growth of 1 cm in 2 years.  I did explain the significance of her advanced age of 77.  Would not recommend open repair.  She does appear to be a excellent anatomic candidate for stent graft repair.  I explained the magnitude of the procedure with an expected overnight hospitalization.  I have said that she is at the threshold of appropriate treatment either with watching or with stent graft currently.  She wishes to consider these options and will either see me again in 6 months with repeat CT scan or proceed with elective repair at this point.    Rosetta Posner, MD FACS [facility] Tel [phone] Pager [phone]

## 2018-09-15 ENCOUNTER — Encounter: Payer: Self-pay | Admitting: Nurse Practitioner

## 2018-09-15 DIAGNOSIS — N182 Chronic kidney disease, stage 2 (mild): Secondary | ICD-10-CM

## 2018-09-15 DIAGNOSIS — E1122 Type 2 diabetes mellitus with diabetic chronic kidney disease: Secondary | ICD-10-CM | POA: Insufficient documentation

## 2018-09-15 DIAGNOSIS — L821 Other seborrheic keratosis: Secondary | ICD-10-CM

## 2018-09-15 DIAGNOSIS — I129 Hypertensive chronic kidney disease with stage 1 through stage 4 chronic kidney disease, or unspecified chronic kidney disease: Secondary | ICD-10-CM

## 2018-09-15 DIAGNOSIS — N289 Disorder of kidney and ureter, unspecified: Secondary | ICD-10-CM | POA: Insufficient documentation

## 2018-09-28 ENCOUNTER — Ambulatory Visit (INDEPENDENT_AMBULATORY_CARE_PROVIDER_SITE_OTHER): Payer: Medicare Other | Admitting: Internal Medicine

## 2018-09-28 ENCOUNTER — Ambulatory Visit (INDEPENDENT_AMBULATORY_CARE_PROVIDER_SITE_OTHER): Payer: Medicare Other

## 2018-09-28 VITALS — BP 132/64 | HR 71 | Temp 98.2°F | Ht 65.0 in | Wt 169.0 lb

## 2018-09-28 DIAGNOSIS — E1122 Type 2 diabetes mellitus with diabetic chronic kidney disease: Secondary | ICD-10-CM | POA: Diagnosis not present

## 2018-09-28 DIAGNOSIS — I129 Hypertensive chronic kidney disease with stage 1 through stage 4 chronic kidney disease, or unspecified chronic kidney disease: Secondary | ICD-10-CM | POA: Diagnosis not present

## 2018-09-28 DIAGNOSIS — Z79899 Other long term (current) drug therapy: Secondary | ICD-10-CM

## 2018-09-28 DIAGNOSIS — Z0000 Encounter for general adult medical examination without abnormal findings: Secondary | ICD-10-CM

## 2018-09-28 DIAGNOSIS — Z5181 Encounter for therapeutic drug level monitoring: Secondary | ICD-10-CM | POA: Diagnosis not present

## 2018-09-28 DIAGNOSIS — E559 Vitamin D deficiency, unspecified: Secondary | ICD-10-CM

## 2018-09-28 DIAGNOSIS — Z23 Encounter for immunization: Secondary | ICD-10-CM

## 2018-09-28 DIAGNOSIS — N182 Chronic kidney disease, stage 2 (mild): Secondary | ICD-10-CM

## 2018-09-28 DIAGNOSIS — N289 Disorder of kidney and ureter, unspecified: Secondary | ICD-10-CM

## 2018-09-28 LAB — POCT URINALYSIS DIPSTICK
Bilirubin, UA: NEGATIVE
Glucose, UA: NEGATIVE
Ketones, UA: NEGATIVE
Nitrite, UA: NEGATIVE
Protein, UA: NEGATIVE
Spec Grav, UA: 1.02 (ref 1.010–1.025)
Urobilinogen, UA: 1 E.U./dL
pH, UA: 6.5 (ref 5.0–8.0)

## 2018-09-28 LAB — POCT UA - MICROALBUMIN
Albumin/Creatinine Ratio, Urine, POC: <30
Creatinine, POC: 300 mg/dL
Microalbumin Ur, POC: 10 mg/L

## 2018-09-28 NOTE — Progress Notes (Signed)
Subjective:   Molly Meyer is a 84 y.o. female who presents for Medicare Annual (Subsequent) preventive examination.  Review of Systems:  N/A Cardiac Risk Factors include: advanced age (>21mn, >>104women);diabetes mellitus;hypertension;sedentary lifestyle     Objective:     Vitals: BP 132/64 (BP Location: Left Arm)   Pulse 71   Temp 98.2 F (36.8 C)   Ht _0  (1.651 m)   Wt 169 lb (76.7 kg)   SpO2 93%   BMI 28.12 kg/m   Body mass index is 28.12 kg/m.  Advanced Directives 09/28/2018 09/12/2018 04/14/2016 06/20/2015  Does Patient Have a Medical Advance Directive? Yes Yes Yes No  Type of [place]- -  Does patient want to make changes to medical advance directive? No - Patient declined - - -  Copy of [place] Chart? No - copy requested - - -  Would patient like information on creating a medical advance directive? - - - No - patient declined information    Tobacco Social History   Tobacco Use  Smoking Status Never Smoker  Smokeless Tobacco Never Used     Counseling given: Not Answered   Clinical Intake:  Pre-visit preparation completed: Yes  Pain : No/denies pain Pain Score: 0-No pain     Nutritional Status: BMI 25 -29 Overweight Nutritional Risks: None Diabetes: Yes CBG done?: No Did pt. bring in CBG monitor from home?: No  How often do you need to have someone help you when you read instructions, pamphlets, or other written materials from your doctor or pharmacy?: 1 - Never What is the last grade level you completed in school?: 12th grade  Interpreter Needed?: No  Information entered by :: NAllen LPN  Past Medical History:  Diagnosis Date  . Degenerative disc disease, lumbar   . Diabetes mellitus without complication ([place]   . Hypertension    Past Surgical History:  Procedure Laterality Date  . KNEE SURGERY     History reviewed. No pertinent  family history. Social History   Socioeconomic History  . Marital status: Widowed    Spouse name: Not on file  . Number of children: Not on file  . Years of education: Not on file  . Highest education level: Not on file  Occupational History  . Not on file  Social Needs  . Financial resource strain: Not hard at all  . Food insecurity:    Worry: Never true    Inability: Never true  . Transportation needs:    Medical: No    Non-medical: No  Tobacco Use  . Smoking status: Never Smoker  . Smokeless tobacco: Never Used  Substance and Sexual Activity  . Alcohol use: No    Alcohol/week: 0.0 standard drinks  . Drug use: Never  . Sexual activity: Not Currently  Lifestyle  . Physical activity:    Days per week: 0 days    Minutes per session: 0 min  . Stress: Not at all  Relationships  . Social connections:    Talks on phone: Not on file    Gets together: Not on file    Attends religious service: Not on file    Active member of club or organization: Not on file    Attends meetings of clubs or organizations: Not on file    Relationship status: Not on file  Other Topics Concern  . Not on file  Social History Narrative  .  Not on file    Outpatient Encounter Medications as of 09/28/2018  Medication Sig  . acetaminophen (TYLENOL) 500 MG tablet Take 2 tablets (1,000 mg total) by mouth every 6 (six) hours as needed for mild pain or moderate pain.  . Cholecalciferol (VITAMIN D3) 2000 UNITS capsule Take 2,000 Units by mouth daily. 1 qd  . docusate sodium (COLACE) 100 MG capsule Take 100 mg by mouth daily as needed for mild constipation.  Marland Kitchen glucose blood (ACCU-CHEK SMARTVIEW) test strip CHECK BLOOD SUGAR ONCE BEFORE BREAKFAST AND DINNER  . Lancets Misc. (ACCU-CHEK FASTCLIX LANCET) KIT USE TO CHECK BS DAILY, DX: 250.02  . linaclotide (LINZESS) 72 MCG capsule Take 72 mcg by mouth daily before breakfast.  . lisinopril-hydrochlorothiazide (PRINZIDE,ZESTORETIC) 10-12.5 MG per tablet Take 1  tablet by mouth daily.  . mirabegron ER (MYRBETRIQ) 25 MG TB24 tablet Take 25 mg by mouth daily.  . simvastatin (ZOCOR) 20 MG tablet TAKE 1 TABLET BY MOUTH ONCE EVERY EVENING   No facility-administered encounter medications on file as of 09/28/2018.     Activities of Daily Living In your present state of health, do you have any difficulty performing the following activities: 09/28/2018  Hearing? Y  Comment Wear hearing aides.  Vision? N  Difficulty concentrating or making decisions? N  Walking or climbing stairs? N  Dressing or bathing? N  Doing errands, shopping? N  Preparing Food and eating ? N  Using the Toilet? N  In the past six months, have you accidently leaked urine? Y  Comment occasional urine leaks  Do you have problems with loss of bowel control? N  Managing your Medications? N  Managing your Finances? N  Housekeeping or managing your Housekeeping? N  Some recent data might be hidden    Patient Care Team: [name], MD as PCP - General (Internal Medicine) Camelia Phenes, DPM as Consulting Physician (Podiatry)    Assessment:   This is a routine wellness examination for [place].  Exercise Activities and Dietary recommendations Current Exercise Habits: The patient does not participate in regular exercise at present  Goals    . DIET - INCREASE WATER INTAKE (pt-stated)     Work on pushing water intake. Daughter helps with water reminders.       Fall Risk Fall Risk  09/28/2018 07/18/2017  Falls in the past year? No No  Comment - Emmi Telephone Survey: data to providers prior to load  Risk for fall due to : Impaired balance/gait;Impaired mobility;Medication side effect -   Is the patient's home free of loose throw rugs in walkways, pet beds, electrical cords, etc?   yes      Grab bars in the bathroom? yes      Handrails on the stairs?   no      Adequate lighting?   yes  Timed Get Up and Go performed: N/A  Depression Screen PHQ 2/9 Scores 09/28/2018    PHQ - 2 Score 0     Cognitive Function     6CIT Screen 09/28/2018  What Year? 0 points  What month? 0 points  What time? 0 points  Count back from 20 0 points  Months in reverse 0 points  Repeat phrase 0 points  Total Score 0    Immunization History  Administered Date(s) Administered  . Influenza, High Dose Seasonal PF 09/28/2018    Qualifies for Shingles Vaccine?declined  Screening Tests Health Maintenance  Topic Date Due  . FOOT EXAM  03/03/1936  . OPHTHALMOLOGY EXAM  03/03/1936  . TETANUS/TDAP  03/03/1945  . PNA vac Low Risk Adult (1 of 2 - PCV13) 03/04/1991  . INFLUENZA VACCINE  07/27/2018  . HEMOGLOBIN A1C  11/10/2018  . DEXA SCAN  Completed    Cancer Screenings: Lung: Low Dose CT Chest recommended if Age 61-80 years, 30 pack-year currently smoking OR have quit w/in 15years. Patient does not qualify. Breast:  Up to date on Mammogram? Yes   Up to date of Bone Density/Dexa? Yes Colorectal: up to date  Additional Screenings: : Hepatitis C Screening: N/A     Plan:    Patient wants to work on increasing water intake. She states she will be having stents placed with Dr. Donnetta Hutching in the near future.   I have personally reviewed and noted the following in the patient's chart:   . Medical and social history . Use of alcohol, tobacco or illicit drugs  . Current medications and supplements . Functional ability and status . Nutritional status . Physical activity . Advanced directives . List of other physicians . Hospitalizations, surgeries, and ER visits in previous 12 months . Vitals . Screenings to include cognitive, depression, and falls . Referrals and appointments  In addition, I have reviewed and discussed with patient certain preventive protocols, quality metrics, and best practice recommendations. A written personalized care plan for preventive services as well as general preventive health recommendations were provided to patient.     Kellie Simmering,  LPN  [id number]

## 2018-09-28 NOTE — Patient Instructions (Signed)
Molly Meyer , Thank you for taking time to come for your Medicare Wellness Visit. I appreciate your ongoing commitment to your health goals. Please review the following plan we discussed and let me know if I can assist you in the future.   Screening recommendations/referrals: Colonoscopy: up to date Mammogram: up to date Bone Density: up to date Recommended yearly ophthalmology/optometry visit for glaucoma screening and checkup Recommended yearly dental visit for hygiene and checkup  Vaccinations: Influenza vaccine: administered today Pneumococcal vaccine: up to date Tdap vaccine: up to date Shingles vaccine: declined    Advanced directives: yes  Conditions/risks identified: Cardiac issues. Going to have stents placed  Next appointment: 01/30/2019 at 10:00a   Preventive Care 65 Years and Older, Female Preventive care refers to lifestyle choices and visits with your health care provider that can promote health and wellness. What does preventive care include?  A yearly physical exam. This is also called an annual well check.  Dental exams once or twice a year.  Routine eye exams. Ask your health care provider how often you should have your eyes checked.  Personal lifestyle choices, including:  Daily care of your teeth and gums.  Regular physical activity.  Eating a healthy diet.  Avoiding tobacco and drug use.  Limiting alcohol use.  Practicing safe sex.  Taking low-dose aspirin every day.  Taking vitamin and mineral supplements as recommended by your health care provider. What happens during an annual well check? The services and screenings done by your health care provider during your annual well check will depend on your age, overall health, lifestyle risk factors, and family history of disease. Counseling  Your health care provider may ask you questions about your:  Alcohol use.  Tobacco use.  Drug use.  Emotional well-being.  Home and relationship  well-being.  Sexual activity.  Eating habits.  History of falls.  Memory and ability to understand (cognition).  Work and work Statistician.  Reproductive health. Screening  You may have the following tests or measurements:  Height, weight, and BMI.  Blood pressure.  Lipid and cholesterol levels. These may be checked every 5 years, or more frequently if you are over 28 years old.  Skin check.  Lung cancer screening. You may have this screening every year starting at age 4 if you have a 30-pack-year history of smoking and currently smoke or have quit within the past 15 years.  Fecal occult blood test (FOBT) of the stool. You may have this test every year starting at age 67.  Flexible sigmoidoscopy or colonoscopy. You may have a sigmoidoscopy every 5 years or a colonoscopy every 10 years starting at age 21.  Hepatitis C blood test.  Hepatitis B blood test.  Sexually transmitted disease (STD) testing.  Diabetes screening. This is done by checking your blood sugar (glucose) after you have not eaten for a while (fasting). You may have this done every 1-3 years.  Bone density scan. This is done to screen for osteoporosis. You may have this done starting at age 44.  Mammogram. This may be done every 1-2 years. Talk to your health care provider about how often you should have regular mammograms. Talk with your health care provider about your test results, treatment options, and if necessary, the need for more tests. Vaccines  Your health care provider may recommend certain vaccines, such as:  Influenza vaccine. This is recommended every year.  Tetanus, diphtheria, and acellular pertussis (Tdap, Td) vaccine. You may need a Td booster every  10 years.  Zoster vaccine. You may need this after age 76.  Pneumococcal 13-valent conjugate (PCV13) vaccine. One dose is recommended after age 82.  Pneumococcal polysaccharide (PPSV23) vaccine. One dose is recommended after age  87. Talk to your health care provider about which screenings and vaccines you need and how often you need them. This information is not intended to replace advice given to you by your health care provider. Make sure you discuss any questions you have with your health care provider. Document Released: 01/09/2016 Document Revised: 09/01/2016 Document Reviewed: 10/14/2015 Elsevier Interactive Patient Education  2017 [place] Prevention in the Home Falls can cause injuries. They can happen to people of all ages. There are many things you can do to make your home safe and to help prevent falls. What can I do on the outside of my home?  Regularly fix the edges of walkways and driveways and fix any cracks.  Remove anything that might make you trip as you walk through a door, such as a raised step or threshold.  Trim any bushes or trees on the path to your home.  Use bright outdoor lighting.  Clear any walking paths of anything that might make someone trip, such as rocks or tools.  Regularly check to see if handrails are loose or broken. Make sure that both sides of any steps have handrails.  Any raised decks and porches should have guardrails on the edges.  Have any leaves, snow, or ice cleared regularly.  Use sand or salt on walking paths during winter.  Clean up any spills in your garage right away. This includes oil or grease spills. What can I do in the bathroom?  Use night lights.  Install grab bars by the toilet and in the tub and shower. Do not use towel bars as grab bars.  Use non-skid mats or decals in the tub or shower.  If you need to sit down in the shower, use a plastic, non-slip stool.  Keep the floor dry. Clean up any water that spills on the floor as soon as it happens.  Remove soap buildup in the tub or shower regularly.  Attach bath mats securely with double-sided non-slip rug tape.  Do not have throw rugs and other things on the floor that can make  you trip. What can I do in the bedroom?  Use night lights.  Make sure that you have a light by your bed that is easy to reach.  Do not use any sheets or blankets that are too big for your bed. They should not hang down onto the floor.  Have a firm chair that has side arms. You can use this for support while you get dressed.  Do not have throw rugs and other things on the floor that can make you trip. What can I do in the kitchen?  Clean up any spills right away.  Avoid walking on wet floors.  Keep items that you use a lot in easy-to-reach places.  If you need to reach something above you, use a strong step stool that has a grab bar.  Keep electrical cords out of the way.  Do not use floor polish or wax that makes floors slippery. If you must use wax, use non-skid floor wax.  Do not have throw rugs and other things on the floor that can make you trip. What can I do with my stairs?  Do not leave any items on the stairs.  Make sure  that there are handrails on both sides of the stairs and use them. Fix handrails that are broken or loose. Make sure that handrails are as long as the stairways.  Check any carpeting to make sure that it is firmly attached to the stairs. Fix any carpet that is loose or worn.  Avoid having throw rugs at the top or bottom of the stairs. If you do have throw rugs, attach them to the floor with carpet tape.  Make sure that you have a light switch at the top of the stairs and the bottom of the stairs. If you do not have them, ask someone to add them for you. What else can I do to help prevent falls?  Wear shoes that:  Do not have high heels.  Have rubber bottoms.  Are comfortable and fit you well.  Are closed at the toe. Do not wear sandals.  If you use a stepladder:  Make sure that it is fully opened. Do not climb a closed stepladder.  Make sure that both sides of the stepladder are locked into place.  Ask someone to hold it for you, if  possible.  Clearly mark and make sure that you can see:  Any grab bars or handrails.  First and last steps.  Where the edge of each step is.  Use tools that help you move around (mobility aids) if they are needed. These include:  Canes.  Walkers.  Scooters.  Crutches.  Turn on the lights when you go into a dark area. Replace any light bulbs as soon as they burn out.  Set up your furniture so you have a clear path. Avoid moving your furniture around.  If any of your floors are uneven, fix them.  If there are any pets around you, be aware of where they are.  Review your medicines with your doctor. Some medicines can make you feel dizzy. This can increase your chance of falling. Ask your doctor what other things that you can do to help prevent falls. This information is not intended to replace advice given to you by your health care provider. Make sure you discuss any questions you have with your health care provider. Document Released: 10/09/2009 Document Revised: 05/20/2016 Document Reviewed: 01/17/2015 Elsevier Interactive Patient Education  2017 [employer].

## 2018-09-29 LAB — CMP14+EGFR
ALT: 10 IU/L (ref 0–32)
AST: 14 IU/L (ref 0–40)
Albumin/Globulin Ratio: 1.8 (ref 1.2–2.2)
Albumin: 4.4 g/dL (ref 3.2–4.6)
Alkaline Phosphatase: 52 IU/L (ref 39–117)
BUN/Creatinine Ratio: 16 (ref 12–28)
BUN: 13 mg/dL (ref 10–36)
Bilirubin Total: 0.5 mg/dL (ref 0.0–1.2)
CO2: 23 mmol/L (ref 20–29)
Calcium: 10 mg/dL (ref 8.7–10.3)
Chloride: 101 mmol/L (ref 96–106)
Creatinine, Ser: 0.8 mg/dL (ref 0.57–1.00)
GFR calc Af Amer: 74 mL/min/{1.73_m2} (ref >59)
GFR calc non Af Amer: 64 mL/min/{1.73_m2} (ref >59)
Globulin, Total: 2.5 g/dL (ref 1.5–4.5)
Glucose: 117 mg/dL — ABNORMAL HIGH (ref 65–99)
Potassium: 4.2 mmol/L (ref 3.5–5.2)
Sodium: 142 mmol/L (ref 134–144)
Total Protein: 6.9 g/dL (ref 6.0–8.5)

## 2018-09-29 LAB — HEMOGLOBIN A1C
Est. average glucose Bld gHb Est-mCnc: 131 mg/dL
Hgb A1c MFr Bld: 6.2 % — ABNORMAL HIGH (ref 4.8–5.6)

## 2018-09-29 LAB — VITAMIN D 25 HYDROXY (VIT D DEFICIENCY, FRACTURES): Vit D, 25-Hydroxy: 28 ng/mL — ABNORMAL LOW (ref 30.0–100.0)

## 2018-10-01 ENCOUNTER — Encounter: Payer: Self-pay | Admitting: Internal Medicine

## 2018-10-01 IMAGING — US US ABDOMEN COMPLETE
1 series · 14 of 25 positions shown · non-contrast
Comparison: CT 04/14/2016.

CLINICAL DATA: Follow-up hepatic and renal cysts.

EXAM:
ABDOMEN ULTRASOUND COMPLETE

[Series 1: us abdomen complete · 0.19mm/px · 14 of 125 slices shown]
[im 1/125]
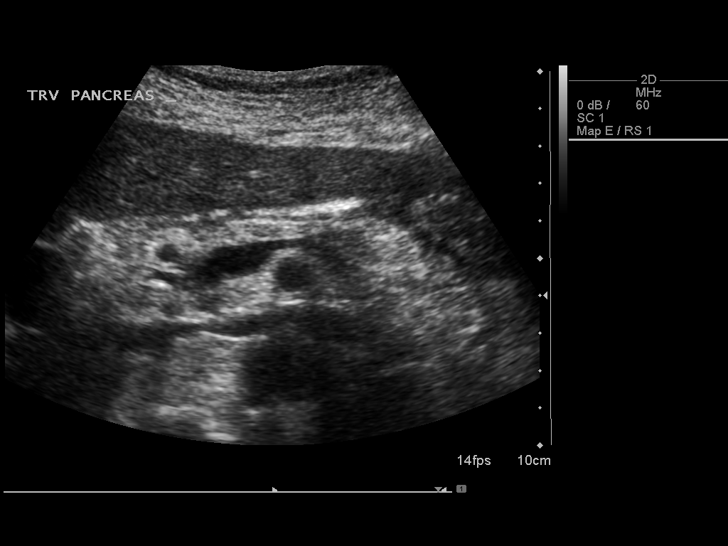
[im 11/125]
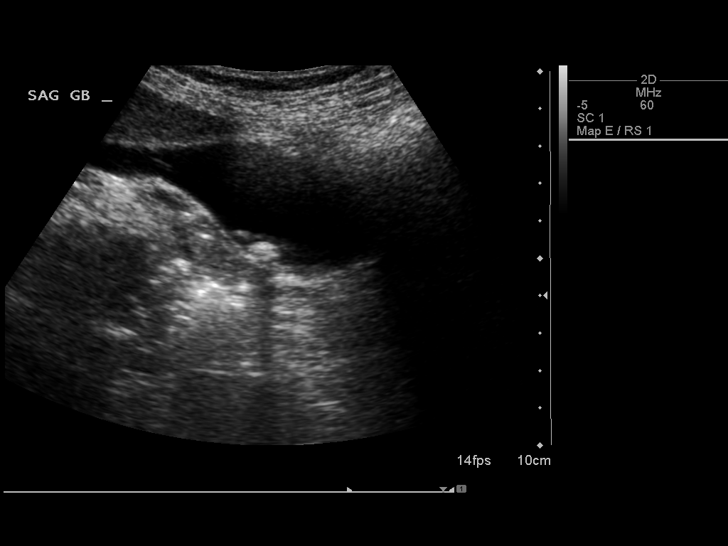
[im 21/125]
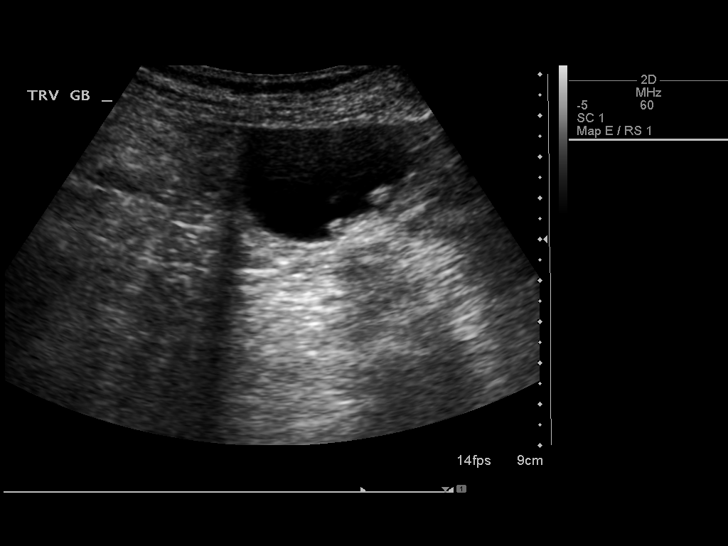
[im 32/125]
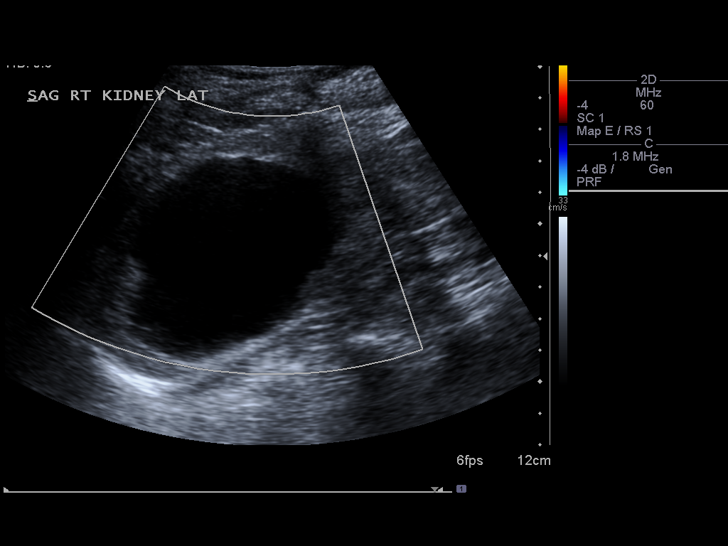
[im 42/125]
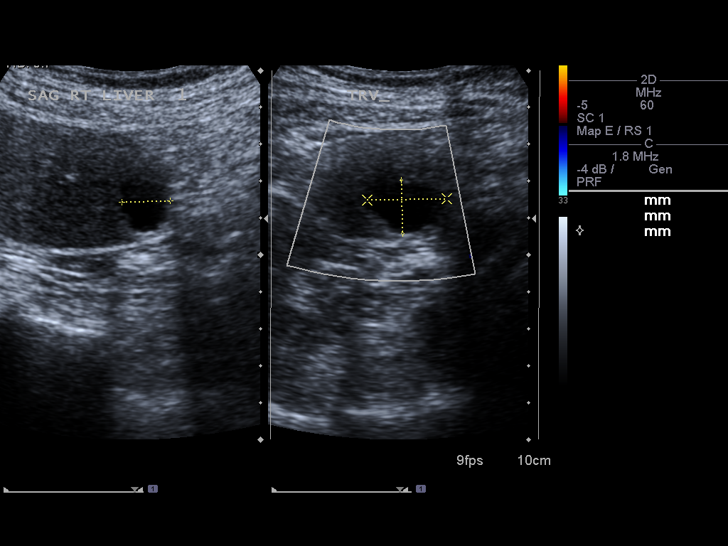
[im 47/125]
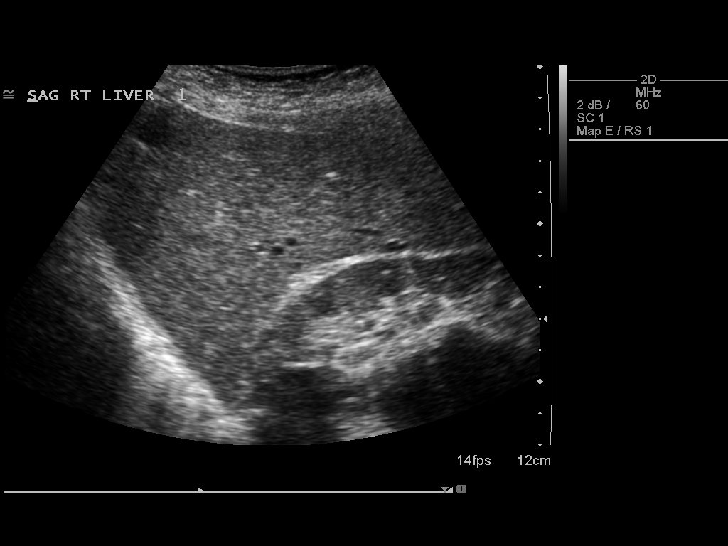
[im 57/125]
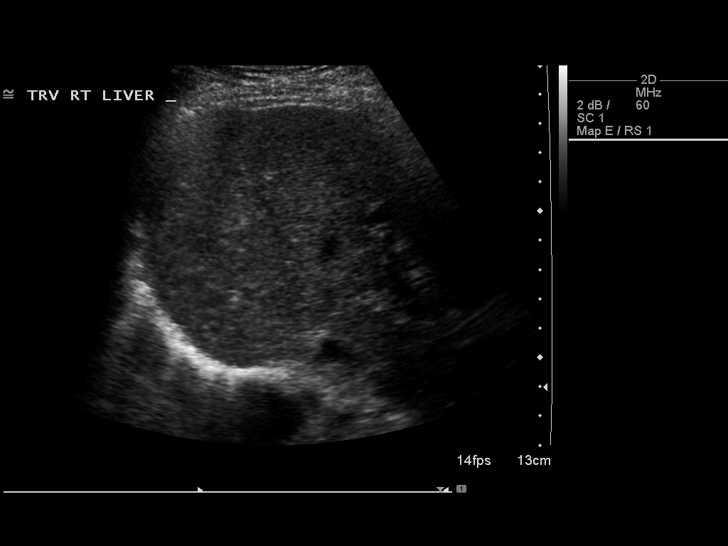
[im 68/125]
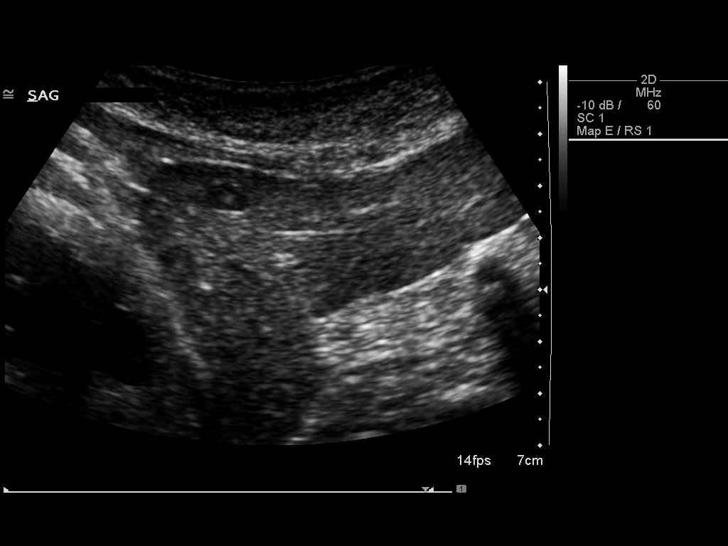
[im 78/125]
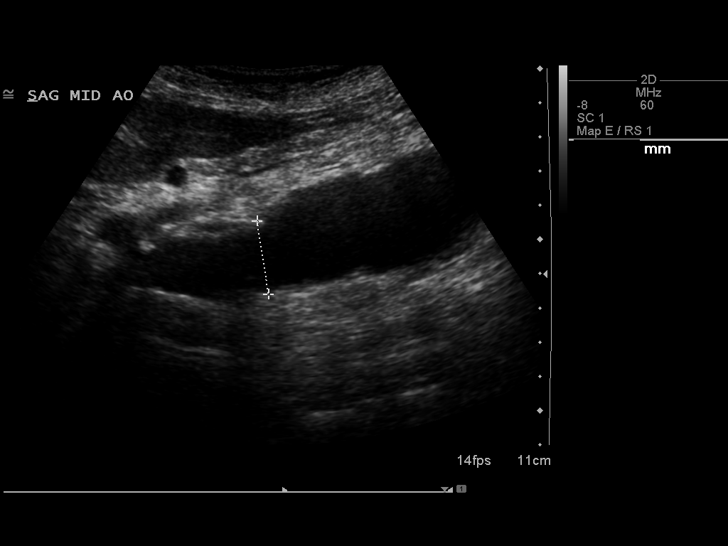
[im 83/125]
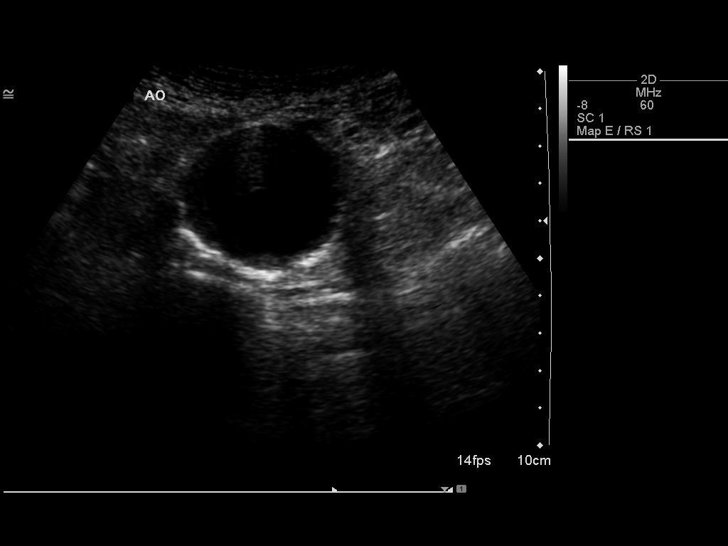
[im 94/125]
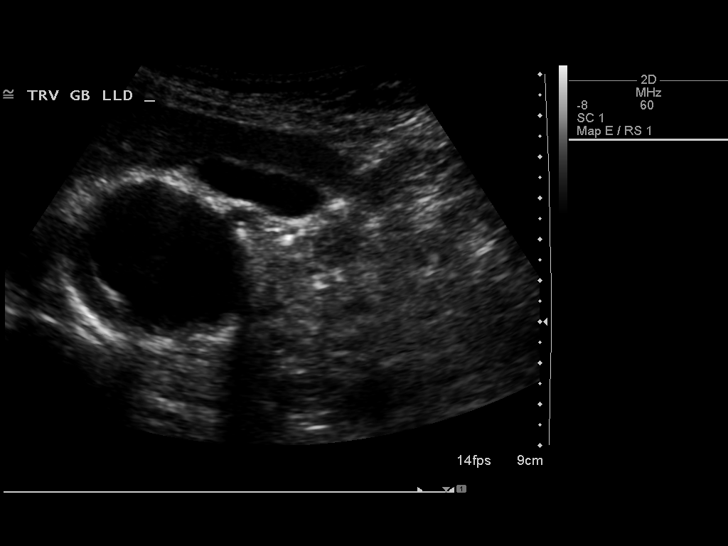
[im 104/125]
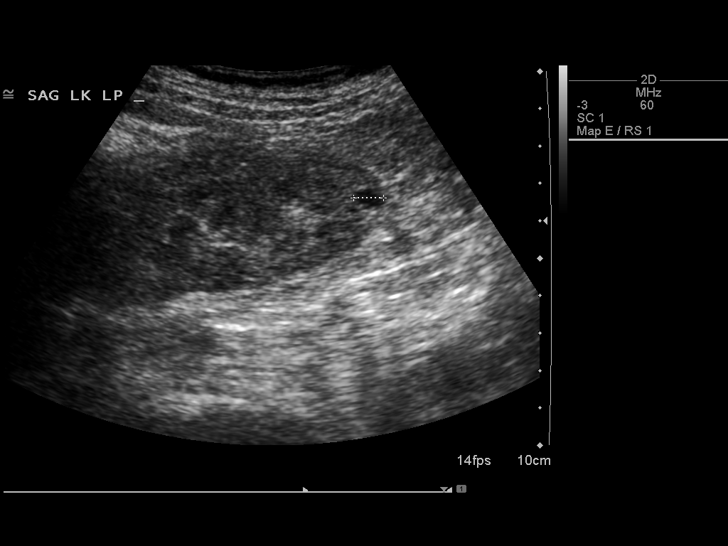
[im 114/125]
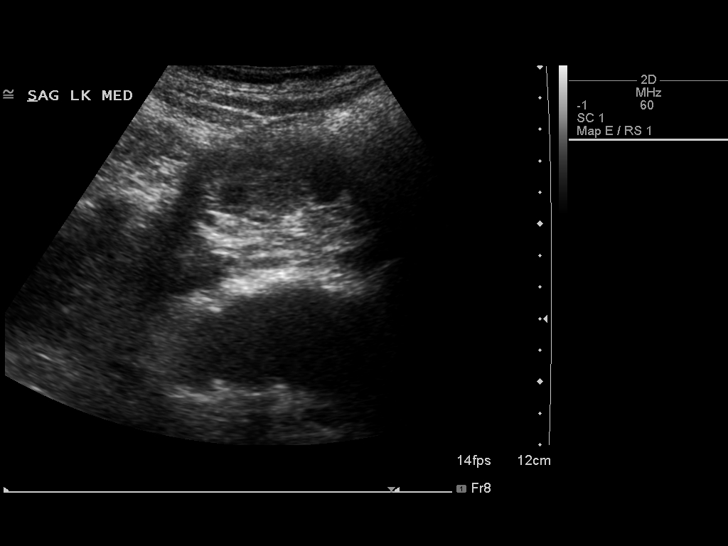
[im 125/125]
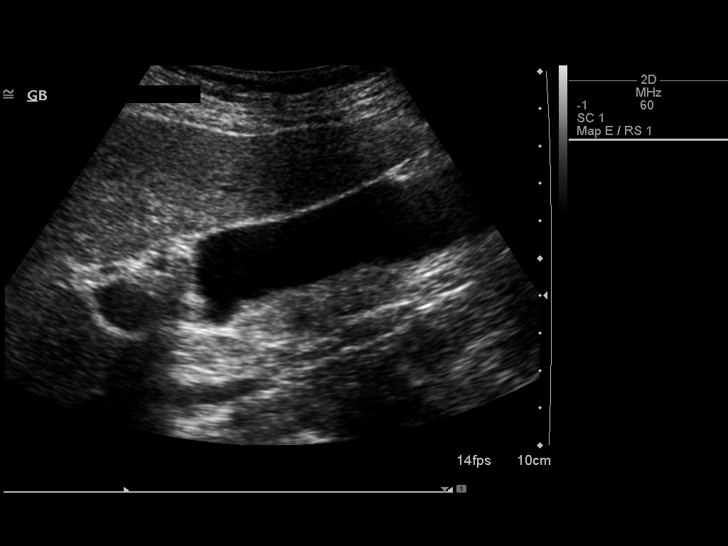

[14 of 25 positions shown; findings below may reference images not displayed]

FINDINGS: Gallbladder: Multiple gallstones. Sludge. Gallbladder wall thickness
normal. Negative Murphy sign.

Common bile duct: Diameter: 3.8 mm

Liver: Multiple benign-appearing hepatic cysts again noted. Liver
echogenicity normal.

IVC: No abnormality visualized.

Pancreas: Visualized portion unremarkable.

Spleen: Size and appearance within normal limits.

Right Kidney: Length: 11.8 cm. Echogenicity within normal limits. No
hydronephrosis visualized. Simple cysts again noted. Largest
measures 6.2 cm.

Left Kidney: Length: 11.1 cm. Echogenicity within normal limits. No
hydronephrosis visualized. Simple cysts again noted. Largest
measures 5.2 cm.

Abdominal aorta: 4.7 x 4.2 abdominal aortic aneurysm is again noted.

Other findings: None.
IMPRESSION: 1. Multiple gallstones and gallbladder sludge. No evidence
cholecystitis or biliary distention.

2.  Multiple hepatic and renal cysts are again noted.

3.  4.7 x 4.2 no abdominal aortic aneurysm again noted .

## 2018-10-01 NOTE — Progress Notes (Signed)
Subjective:     Patient ID: Molly Meyer , female    DOB: 1924-09-07 , 84 y.o.   MRN: [id number]   Diabetes  She presents for her follow-up diabetic visit. She has type 2 diabetes mellitus. There are no hypoglycemic associated symptoms. Pertinent negatives for diabetes include no blurred vision, no chest pain, no foot ulcerations and no visual change. There are no hypoglycemic complications. Diabetic complications include nephropathy. Risk factors for coronary artery disease include sedentary lifestyle, post-menopausal, hypertension, diabetes mellitus and dyslipidemia. Her breakfast blood glucose is taken between 8-9 am. Her breakfast blood glucose range is generally 130-140 mg/dl.  Hypertension  This is a chronic problem. The current episode started more than 1 year ago. The problem has been gradually improving since onset. The problem is controlled. Pertinent negatives include no blurred vision or chest pain.   REPORTS COMPLIANCE WITH MEDS.   Past Medical History:  Diagnosis Date  . Degenerative disc disease, lumbar   . Diabetes mellitus without complication ([place])   . Hypertension       Current Outpatient Medications:  .  acetaminophen (TYLENOL) 500 MG tablet, Take 2 tablets (1,000 mg total) by mouth every 6 (six) hours as needed for mild pain or moderate pain., Disp: 30 tablet, Rfl: 0 .  Cholecalciferol (VITAMIN D3) 2000 UNITS capsule, Take 2,000 Units by mouth daily. 1 qd, Disp: , Rfl:  .  docusate sodium (COLACE) 100 MG capsule, Take 100 mg by mouth daily as needed for mild constipation., Disp: , Rfl:  .  glucose blood (ACCU-CHEK SMARTVIEW) test strip, CHECK BLOOD SUGAR ONCE BEFORE BREAKFAST AND DINNER, Disp: , Rfl:  .  Lancets Misc. (ACCU-CHEK FASTCLIX LANCET) KIT, USE TO CHECK BS DAILY, DX: 250.02, Disp: , Rfl:  .  linaclotide (LINZESS) 72 MCG capsule, Take 72 mcg by mouth daily before breakfast., Disp: , Rfl:  .  lisinopril-hydrochlorothiazide (PRINZIDE,ZESTORETIC) 10-12.5 MG per  tablet, Take 1 tablet by mouth daily., Disp: , Rfl:  .  mirabegron ER (MYRBETRIQ) 25 MG TB24 tablet, Take 25 mg by mouth daily., Disp: , Rfl:  .  simvastatin (ZOCOR) 20 MG tablet, TAKE 1 TABLET BY MOUTH ONCE EVERY EVENING, Disp: , Rfl: 0   Review of Systems  Constitutional: Negative.   HENT: Negative.   Eyes: Negative for blurred vision.  Respiratory: Negative.   Cardiovascular: Negative.  Negative for chest pain.  Gastrointestinal: Negative.   Psychiatric/Behavioral: Negative.      Today's Vitals   09/28/18 1134  BP: 132/64  Pulse: 71  Temp: 98.2 F (36.8 C)  SpO2: 93%  Weight: 169 lb (76.7 kg)  Height: '5\' 5"'$  (1.651 m)  PainSc: 0-No pain   Body mass index is 28.12 kg/m.   Objective:  Physical Exam  Constitutional: She appears well-developed and well-nourished.  HENT:  Head: Normocephalic and atraumatic.  Eyes: Pupils are equal, round, and reactive to light. EOM are normal.  Neck: Normal range of motion. Neck supple.  Cardiovascular: Normal rate and regular rhythm.  Pulmonary/Chest: Effort normal and breath sounds normal.  Skin: Skin is warm and dry.  Psychiatric: She has a normal mood and affect.  Nursing note and vitals reviewed.       Assessment And Plan:     Type 2 diabetes mellitus with stage 2 chronic kidney disease, without long-term current use of insulin (HCC) - AWV PERFORMED TODAY. SHE IS ENCOURAGED TO CONTINUE WITH REGULAR ACTIVITY.  - Plan: CMP14+EGFR, Hemoglobin A1c  Chronic renal disease, stage II - SHE IS  ENCOURAGED TO STAY WELL HYDRATED. I WILL CHECK GFR, CR TODAY.   Nephropathy - CHRONIC.   Hypertensive nephropathy - WELL CONTROLLED. SHE WILL CONTINUE WITH CURRENT MEDS. SHE IS ENCOURAGED TO AVOID ADDING SALT TO HER FOODS.   Vitamin D deficiency disease - SHE IS ENCOURAGED TO SUPPLEMENT WITH VIT D3 2000 UNITS ONCE DAILY, IN SOFTGEL FORM.  - Plan: Vitamin D (25 hydroxy)  Encounter for monitoring antihypertensive use  Need for prophylactic  vaccination and inoculation against influenza - SHE WAS GIVEN HIGH DOSE FLU VACCINE.     Maximino Greenland, MD

## 2018-10-02 NOTE — Progress Notes (Signed)
Your kidney and liver function are stable. Your a1c is great, at 6.2. Your vitamin D level is low at 28. If you are not taking any vitamin D, start 2000 units once daily, in softgel form.

## 2018-10-04 DIAGNOSIS — Z961 Presence of intraocular lens: Secondary | ICD-10-CM | POA: Diagnosis not present

## 2018-10-04 DIAGNOSIS — H524 Presbyopia: Secondary | ICD-10-CM | POA: Diagnosis not present

## 2018-10-04 DIAGNOSIS — E119 Type 2 diabetes mellitus without complications: Secondary | ICD-10-CM | POA: Diagnosis not present

## 2018-10-04 DIAGNOSIS — H04123 Dry eye syndrome of bilateral lacrimal glands: Secondary | ICD-10-CM | POA: Diagnosis not present

## 2018-10-04 LAB — HM DIABETES EYE EXAM

## 2018-10-10 ENCOUNTER — Ambulatory Visit: Payer: Medicare Other | Admitting: Podiatry

## 2018-10-10 ENCOUNTER — Ambulatory Visit: Payer: Medicare Other | Admitting: Cardiology

## 2018-10-11 ENCOUNTER — Encounter: Payer: Self-pay | Admitting: Vascular Surgery

## 2018-10-11 DIAGNOSIS — L723 Sebaceous cyst: Secondary | ICD-10-CM | POA: Diagnosis not present

## 2018-10-11 DIAGNOSIS — Z23 Encounter for immunization: Secondary | ICD-10-CM | POA: Diagnosis not present

## 2018-10-11 DIAGNOSIS — L570 Actinic keratosis: Secondary | ICD-10-CM | POA: Diagnosis not present

## 2018-10-11 DIAGNOSIS — L821 Other seborrheic keratosis: Secondary | ICD-10-CM | POA: Diagnosis not present

## 2018-10-16 ENCOUNTER — Encounter: Payer: Self-pay | Admitting: Cardiology

## 2018-10-16 ENCOUNTER — Ambulatory Visit (INDEPENDENT_AMBULATORY_CARE_PROVIDER_SITE_OTHER): Payer: Medicare Other | Admitting: Cardiology

## 2018-10-16 VITALS — BP 134/62 | HR 71 | Ht 67.0 in | Wt 169.8 lb

## 2018-10-16 DIAGNOSIS — Z01818 Encounter for other preprocedural examination: Secondary | ICD-10-CM | POA: Diagnosis not present

## 2018-10-16 DIAGNOSIS — E0842 Diabetes mellitus due to underlying condition with diabetic polyneuropathy: Secondary | ICD-10-CM | POA: Diagnosis not present

## 2018-10-16 DIAGNOSIS — I129 Hypertensive chronic kidney disease with stage 1 through stage 4 chronic kidney disease, or unspecified chronic kidney disease: Secondary | ICD-10-CM

## 2018-10-16 DIAGNOSIS — I714 Abdominal aortic aneurysm, without rupture, unspecified: Secondary | ICD-10-CM

## 2018-10-16 DIAGNOSIS — N182 Chronic kidney disease, stage 2 (mild): Secondary | ICD-10-CM | POA: Diagnosis not present

## 2018-10-16 NOTE — Patient Instructions (Signed)
Medication Instructions:  Your physician recommends that you continue on your current medications as directed. Please refer to the Current Medication list given to you today.  If you need a refill on your cardiac medications before your next appointment, please call your pharmacy.   Lab work: None.  If you have labs (blood work) drawn today and your tests are completely normal, you will receive your results only by: Marland Kitchen MyChart Message (if you have MyChart) OR . A paper copy in the mail If you have any lab test that is abnormal or we need to change your treatment, we will call you to review the results.  Testing/Procedures: Your physician has requested that you have an echocardiogram. Echocardiography is a painless test that uses sound waves to create images of your heart. It provides your doctor with information about the size and shape of your heart and how well your heart's chambers and valves are working. This procedure takes approximately one hour. There are no restrictions for this procedure.  Your physician has requested that you have a carotid duplex. This test is an ultrasound of the carotid arteries in your neck. It looks at blood flow through these arteries that supply the brain with blood. Allow one hour for this exam. There are no restrictions or special instructions.  Your physician has requested that you have a lexiscan myoview. For further information please visit [email or web address]. Please follow instruction sheet, as given.    Follow-Up: At [facility], you and your health needs are our priority.  As part of our continuing mission to provide you with exceptional heart care, we have created designated Provider Care Teams.  These Care Teams include your primary Cardiologist (physician) and Advanced Practice Providers (APPs -  Physician Assistants and Nurse Practitioners) who all work together to provide you with the care you need, when you need it. You will need a follow up  appointment in 3 months.  Please call our office 2 months in advance to schedule this appointment.  You may see No primary care provider on file. or another member of our [employer] Provider Team in [place]: Shirlee More, MD . Jyl Heinz, MD  Any Other Special Instructions Will Be Listed Below (If Applicable).  Cardiac Nuclear Scan A cardiac nuclear scan is a test that measures blood flow to the heart when a person is resting and when he or she is exercising. The test looks for problems such as:  Not enough blood reaching a portion of the heart.  The heart muscle not working normally.  You may need this test if:  You have heart disease.  You have had abnormal lab results.  You have had heart surgery or angioplasty.  You have chest pain.  You have shortness of breath.  In this test, a radioactive dye (tracer) is injected into your bloodstream. After the tracer has traveled to your heart, an imaging device is used to measure how much of the tracer is absorbed by or distributed to various areas of your heart. This procedure is usually done at a hospital and takes 2-4 hours. Tell a health care provider about:  Any allergies you have.  All medicines you are taking, including vitamins, herbs, eye drops, creams, and over-the-counter medicines.  Any problems you or family members have had with the use of anesthetic medicines.  Any blood disorders you have.  Any surgeries you have had.  Any medical conditions you have.  Whether you are pregnant or may be pregnant. What  are the risks? Generally, this is a safe procedure. However, problems may occur, including:  Serious chest pain and heart attack. This is only a risk if the stress portion of the test is done.  Rapid heartbeat.  Sensation of warmth in your chest. This usually passes quickly.  What happens before the procedure?  Ask your health care provider about changing or stopping your regular medicines. This is  especially important if you are taking diabetes medicines or blood thinners.  Remove your jewelry on the day of the procedure. What happens during the procedure?  An IV tube will be inserted into one of your veins.  Your health care provider will inject a small amount of radioactive tracer through the tube.  You will wait for 20-40 minutes while the tracer travels through your bloodstream.  Your heart activity will be monitored with an electrocardiogram (ECG).  You will lie down on an exam table.  Images of your heart will be taken for about 15-20 minutes.  You may be asked to exercise on a treadmill or stationary bike. While you exercise, your heart's activity will be monitored with an ECG, and your blood pressure will be checked. If you are unable to exercise, you may be given a medicine to increase blood flow to parts of your heart.  When blood flow to your heart has peaked, a tracer will again be injected through the IV tube.  After 20-40 minutes, you will get back on the exam table and have more images taken of your heart.  When the procedure is over, your IV tube will be removed. The procedure may vary among health care providers and hospitals. Depending on the type of tracer used, scans may need to be repeated 3-4 hours later. What happens after the procedure?  Unless your health care provider tells you otherwise, you may return to your normal schedule, including diet, activities, and medicines.  Unless your health care provider tells you otherwise, you may increase your fluid intake. This will help flush the contrast dye from your body. Drink enough fluid to keep your urine clear or pale yellow.  It is up to you to get your test results. Ask your health care provider, or the department that is doing the test, when your results will be ready. Summary  A cardiac nuclear scan measures the blood flow to the heart when a person is resting and when he or she is exercising.  You  may need this test if you are at risk for heart disease.  Tell your health care provider if you are pregnant.  Unless your health care provider tells you otherwise, increase your fluid intake. This will help flush the contrast dye from your body. Drink enough fluid to keep your urine clear or pale yellow. This information is not intended to replace advice given to you by your health care provider. Make sure you discuss any questions you have with your health care provider. Document Released: 01/07/2005 Document Revised: 12/15/2016 Document Reviewed: 11/21/2013 Elsevier Interactive Patient Education  2017 [place].   Echocardiogram An echocardiogram, or echocardiography, uses sound waves (ultrasound) to produce an image of your heart. The echocardiogram is simple, painless, obtained within a short period of time, and offers valuable information to your health care provider. The images from an echocardiogram can provide information such as:  Evidence of coronary artery disease (CAD).  Heart size.  Heart muscle function.  Heart valve function.  Aneurysm detection.  Evidence of a past heart attack.  Fluid buildup around the heart.  Heart muscle thickening.  Assess heart valve function.  Tell a health care provider about:  Any allergies you have.  All medicines you are taking, including vitamins, herbs, eye drops, creams, and over-the-counter medicines.  Any problems you or family members have had with anesthetic medicines.  Any blood disorders you have.  Any surgeries you have had.  Any medical conditions you have.  Whether you are pregnant or may be pregnant. What happens before the procedure? No special preparation is needed. Eat and drink normally. What happens during the procedure?  In order to produce an image of your heart, gel will be applied to your chest and a wand-like tool (transducer) will be moved over your chest. The gel will help transmit the sound  waves from the transducer. The sound waves will harmlessly bounce off your heart to allow the heart images to be captured in real-time motion. These images will then be recorded.  You may need an IV to receive a medicine that improves the quality of the pictures. What happens after the procedure? You may return to your normal schedule including diet, activities, and medicines, unless your health care provider tells you otherwise. This information is not intended to replace advice given to you by your health care provider. Make sure you discuss any questions you have with your health care provider. Document Released: 12/10/2000 Document Revised: 07/31/2016 Document Reviewed: 08/20/2013 Elsevier Interactive Patient Education  2017 [employer]    Carotid Artery Disease The carotid arteries are arteries on both sides of the neck. They carry blood to the brain. Carotid artery disease is when the arteries get smaller (narrow) or get blocked. If these arteries get smaller or get blocked, you are more likely to have a stroke or warning stroke (transient ischemic attack). Follow these instructions at home:  Take medicines as told by your doctor. Make sure you understand all your medicine instructions. Do not stop your medicines without talking to your doctor first.  Follow your doctor's diet instructions. It is important to eat a healthy diet that includes plenty of: ? Fresh fruits. ? Vegetables. ? Lean meats.  Avoid: ? High-fat foods. ? High-sodium foods. ? Foods that are fried, overly processed, or have poor nutritional value.  Stay a healthy weight.  Stay active. Get at least 30 minutes of activity every day.  Do not smoke.  Limit alcohol use to: ? No more than 2 drinks a day for men. ? No more than 1 drink a day for women who are not pregnant.  Do not use illegal drugs.  Keep all doctor visits as told. Get help right away if:  You have sudden weakness or loss of feeling  (numbness) on one side of the body, such as the face, arm, or leg.  You have sudden confusion.  You have trouble speaking (aphasia) or understanding.  You have sudden trouble seeing out of one or both eyes.  You have sudden trouble walking.  You have dizziness or feel like you might pass out (faint).  You have a loss of balance or your movements are not steady (uncoordinated).  You have a sudden, severe headache with no known cause.  You have trouble swallowing (dysphagia). Call your local emergency services (911 in [place] Do notdrive yourself to the clinic or hospital. This information is not intended to replace advice given to you by your health care provider. Make sure you discuss any questions you have with your health care provider.  Document Released: 11/29/2012 Document Revised: 05/20/2016 Document Reviewed: 06/13/2013 Elsevier Interactive Patient Education  [employer].

## 2018-10-16 NOTE — Progress Notes (Signed)
Cardiology Consultation:    Date:  10/16/2018   ID:  AARON BOEH, DOB 11-Apr-1924, MRN [id number]  PCP:  [name], MD  Cardiologist:  Jenne Campus, MD   Referring MD: [name], MD   Chief Complaint  Patient presents with  . AAA  . Pre-op Exam  I need surgery  History of Present Illness:    Molly Meyer is a 84 y.o. female who is being seen today for the evaluation of preop evaluation for abdominal aneurysm at the request of [name], MD.  She is a delightful 84 years old lady who looks much younger than his stated age.  She was diagnosed with abdominal aortic aneurysm a year ago however there was increase in size of the aneurysm and surgery is contemplated.  Apparently she is scheduled for stent implantation into her aneurysm.  From what I understand the operating surgery is not planned.  She was sent to [place] for evaluation from cardiac standpoint review for the surgery.  She does have multiple risk factors for coronary artery disease that includes dyslipidemia diabetes hypertension.  On top of that she does have already atherosclerosis as proven by presence of aneurysm.  She is able to move around however get difficulty with her legs as well as back she said when she goes to the store she have to go carefully she usually use a cart move around the store.  Denies have any cardiac complaint.  There is no chest pain tightness squeezing pressure burning chest no shortness of breath no swelling of lower extremities no dizziness no passing out.  Past Medical History:  Diagnosis Date  . Degenerative disc disease, lumbar   . Diabetes mellitus without complication ([place])   . Hypertension     Past Surgical History:  Procedure Laterality Date  . KNEE SURGERY      Current Medications: Current Meds  Medication Sig  . acetaminophen (TYLENOL) 500 MG tablet Take 2 tablets (1,000 mg total) by mouth every 6 (six) hours as needed for mild pain or moderate pain.  .  Cholecalciferol (VITAMIN D3) 2000 UNITS capsule Take 2,000 Units by mouth daily. 1 qd  . docusate sodium (COLACE) 100 MG capsule Take 100 mg by mouth daily as needed for mild constipation.  Marland Kitchen glucose blood (ACCU-CHEK SMARTVIEW) test strip CHECK BLOOD SUGAR ONCE BEFORE BREAKFAST AND DINNER  . Lancets Misc. (ACCU-CHEK FASTCLIX LANCET) KIT USE TO CHECK BS DAILY, DX: 250.02  . lisinopril-hydrochlorothiazide (PRINZIDE,ZESTORETIC) 10-12.5 MG per tablet Take 1 tablet by mouth daily.  . mirabegron ER (MYRBETRIQ) 25 MG TB24 tablet Take 25 mg by mouth daily.  . simvastatin (ZOCOR) 20 MG tablet TAKE 1 TABLET BY MOUTH ONCE EVERY EVENING     Allergies:   Penicillins   Social History   Socioeconomic History  . Marital status: Widowed    Spouse name: Not on file  . Number of children: Not on file  . Years of education: Not on file  . Highest education level: Not on file  Occupational History  . Not on file  Social Needs  . Financial resource strain: Not hard at all  . Food insecurity:    Worry: Never true    Inability: Never true  . Transportation needs:    Medical: No    Non-medical: No  Tobacco Use  . Smoking status: Never Smoker  . Smokeless tobacco: Never Used  Substance and Sexual Activity  . Alcohol use: No    Alcohol/week: 0.0 standard drinks  .  Drug use: Never  . Sexual activity: Not Currently  Lifestyle  . Physical activity:    Days per week: 0 days    Minutes per session: 0 min  . Stress: Not at all  Relationships  . Social connections:    Talks on phone: Not on file    Gets together: Not on file    Attends religious service: Not on file    Active member of club or organization: Not on file    Attends meetings of clubs or organizations: Not on file    Relationship status: Not on file  Other Topics Concern  . Not on file  Social History Narrative  . Not on file     Family History: The patient's family history includes Hyperlipidemia in her father. ROS:   Please  see the history of present illness.    All 14 point review of systems negative except as described per history of present illness.  EKGs/Labs/Other Studies Reviewed:    The following studies were reviewed today:   EKG:  EKG is  ordered today.  The ekg ordered today demonstrates normal sinus rhythm normal QS complex duration morphology and borderline LVH, nonspecific ST segment changes  Recent Labs: 09/28/2018: ALT 10; BUN 13; Creatinine, Ser 0.80; Potassium 4.2; Sodium 142  Recent Lipid Panel    Component Value Date/Time   CHOL 168 05/10/2018   TRIG 85 05/10/2018   HDL 54 05/10/2018   LDLCALC 97 05/10/2018    Physical Exam:    VS:  BP 134/62   Pulse 71   Ht '5\' 7"'$  (1.702 m)   Wt 169 lb 12.8 oz (77 kg)   SpO2 97%   BMI 26.59 kg/m     Wt Readings from Last 3 Encounters:  10/16/18 169 lb 12.8 oz (77 kg)  09/28/18 169 lb (76.7 kg)  09/28/18 169 lb (76.7 kg)     GEN:  Well nourished, well developed in no acute distress HEENT: Normal NECK: No JVD; No carotid bruits LYMPHATICS: No lymphadenopathy CARDIAC: RRR, no murmurs, no rubs, no gallops RESPIRATORY:  Clear to auscultation without rales, wheezing or rhonchi  ABDOMEN: Soft, non-tender, non-distended MUSCULOSKELETAL:  No edema; No deformity  SKIN: Warm and dry NEUROLOGIC:  Alert and oriented x 3 PSYCHIATRIC:  Normal affect   ASSESSMENT:    1. Benign hypertension with CKD (chronic kidney disease), stage II   2. Diabetic polyneuropathy associated with diabetes mellitus due to underlying condition ([place])   3. AAA (abdominal aortic aneurysm) without rupture (H[facility])    PLAN:    In order of problems listed above:  1. Preop evaluation for this delightful lady with multiple risk factors for coronary artery disease.  I will schedule her to have carotid ultrasounds.  She does have already proven atherosclerosis therefore even with lack of bruit it would be reasonable to perform the test.  Supportive evaluation of her heart I  will ask her to have an echocardiogram to assess left ventricular ejection fraction as well as degree of left ventricular hypertrophy.  She does have borderline left ventricle hypertrophy of the EKG.  Also, I will schedule her to have Lexiscan to make sure she does not have any significant obstructive coronary artery disease.  She does have multiple risk factors for coronary artery disease per exercise tolerance is poor because of orthopedic limitations therefore stress test needs to be done. 2. Dyslipidemia I will ask her to double the dose of simvastatin.  Her last LDL was 97 need to  be less than 70 3. Diabetes: That be followed by internal medicine team. 4. Essential hypertension blood pressure appears to be well controlled we will continue present management.   Medication Adjustments/Labs and Tests Ordered: Current medicines are reviewed at length with the patient today.  Concerns regarding medicines are outlined above.  No orders of the defined types were placed in this encounter.  No orders of the defined types were placed in this encounter.   Signed, Park Liter, MD, [facility]. 10/16/2018 2:30 PM    [place]

## 2018-10-17 ENCOUNTER — Ambulatory Visit: Payer: Medicare Other | Admitting: Podiatry

## 2018-10-18 ENCOUNTER — Other Ambulatory Visit: Payer: Self-pay

## 2018-10-18 MED ORDER — ACCU-CHEK NANO SMARTVIEW W/DEVICE KIT: PACK | 1 refills | Status: DC

## 2018-10-19 ENCOUNTER — Other Ambulatory Visit: Payer: Self-pay | Admitting: Internal Medicine

## 2018-10-19 ENCOUNTER — Other Ambulatory Visit: Payer: Self-pay

## 2018-10-19 ENCOUNTER — Telehealth (HOSPITAL_COMMUNITY): Payer: Self-pay | Admitting: *Deleted

## 2018-10-19 DIAGNOSIS — E1122 Type 2 diabetes mellitus with diabetic chronic kidney disease: Secondary | ICD-10-CM

## 2018-10-19 NOTE — Telephone Encounter (Signed)
Left message on voicemail per DPR in reference to upcoming appointment scheduled on 10/24/18 with detailed instructions given per Myocardial Perfusion Study Information Sheet for the test. LM to arrive 15 minutes early, and that it is imperative to arrive on time for appointment to keep from having the test rescheduled. If you need to cancel or reschedule your appointment, please call the office within 24 hours of your appointment. Failure to do so may result in a cancellation of your appointment, and a $50 no show fee. Phone number given for call back for any questions. Kirstie Peri

## 2018-10-24 ENCOUNTER — Telehealth: Payer: Self-pay | Admitting: Vascular Surgery

## 2018-10-24 ENCOUNTER — Encounter (HOSPITAL_COMMUNITY): Payer: Medicare Other

## 2018-10-24 ENCOUNTER — Ambulatory Visit: Payer: Medicare Other | Admitting: Podiatry

## 2018-10-24 ENCOUNTER — Other Ambulatory Visit: Payer: Self-pay

## 2018-10-24 MED ORDER — ACCU-CHEK GUIDE W/DEVICE KIT: 1.0000 | PACK | Freq: Once | 1 refills | Status: AC

## 2018-10-24 MED ORDER — GLUCOSE BLOOD VI STRP: ORAL_STRIP | 11 refills | Status: DC

## 2018-10-24 NOTE — Telephone Encounter (Signed)
-----   Message from Rosetta Posner, MD sent at 10/24/2018 10:15 AM EDT ----- This lady was being evaluated for stent graft repair.  This will be canceled.  Is going to cancel her echocardiogram and Myoview. Please schedule her for an office visit with me in 6 months and ultrasound of her aorta at that time.  Thank you

## 2018-10-24 NOTE — Telephone Encounter (Signed)
I discussed the patient's plan by telephone this morning.  I reviewed her CT scan with the [facility] representative.  We have concern regarding the angulation below her renal arteries.  May be difficult to seal with the standard approved device.  I did discuss the option of submitting her films to see if she would be a candidate for the investigational conformable device.  She may be borderline for this new investigational device under their strict protocol.  We have discussed at length before that she was certainly "on the fence" as far as watching versus treating her aneurysm.  Explained that there is very little difference over a intermediate.  Of time.  Since she is not a straightforward repair I have recommended that we see her again in 6 months with ultrasound to rule out any progression.  Also explained that these devices are generally approved and then would be more appropriate to treat her with the new device with the conform ability at the proximal anastomosis.  We will cancel her echocardiogram and nuclear cardiac scan since this was simply for preoperative evaluation.  Will notify Dr.Krasowski as well.  I will see her again in 6 months with repeat ultrasound of her aneurysm

## 2018-10-24 NOTE — Telephone Encounter (Signed)
sch appt lvm [facility] ltr 04/24/2019 1130am f/u mD

## 2018-10-25 ENCOUNTER — Ambulatory Visit (HOSPITAL_BASED_OUTPATIENT_CLINIC_OR_DEPARTMENT_OTHER): Payer: Medicare Other

## 2018-11-01 ENCOUNTER — Encounter: Payer: Self-pay | Admitting: Cardiology

## 2018-11-05 ENCOUNTER — Other Ambulatory Visit: Payer: Self-pay | Admitting: Nurse Practitioner

## 2018-11-07 ENCOUNTER — Encounter: Payer: Self-pay | Admitting: Podiatry

## 2018-11-07 ENCOUNTER — Ambulatory Visit (INDEPENDENT_AMBULATORY_CARE_PROVIDER_SITE_OTHER): Payer: Medicare Other | Admitting: Podiatry

## 2018-11-07 VITALS — BP 132/72 | HR 75

## 2018-11-07 DIAGNOSIS — M2042 Other hammer toe(s) (acquired), left foot: Secondary | ICD-10-CM | POA: Diagnosis not present

## 2018-11-07 DIAGNOSIS — M2142 Flat foot [pes planus] (acquired), left foot: Secondary | ICD-10-CM | POA: Diagnosis not present

## 2018-11-07 DIAGNOSIS — E1142 Type 2 diabetes mellitus with diabetic polyneuropathy: Secondary | ICD-10-CM | POA: Diagnosis not present

## 2018-11-07 DIAGNOSIS — B351 Tinea unguium: Secondary | ICD-10-CM

## 2018-11-07 DIAGNOSIS — M2041 Other hammer toe(s) (acquired), right foot: Secondary | ICD-10-CM | POA: Diagnosis not present

## 2018-11-07 DIAGNOSIS — M79674 Pain in right toe(s): Secondary | ICD-10-CM | POA: Diagnosis not present

## 2018-11-07 DIAGNOSIS — M2141 Flat foot [pes planus] (acquired), right foot: Secondary | ICD-10-CM

## 2018-11-07 DIAGNOSIS — M79675 Pain in left toe(s): Secondary | ICD-10-CM

## 2018-11-07 NOTE — Patient Instructions (Signed)

## 2018-11-08 ENCOUNTER — Telehealth: Payer: Self-pay

## 2018-11-08 NOTE — Telephone Encounter (Signed)
New message     Just an FYI. We have made several attempts to contact this patient including sending a letter to schedule or reschedule their echocardiogram and MYOCARDIAL PERFUSION . We will be removing the patient from the WQ.   Thank you

## 2018-11-09 NOTE — Telephone Encounter (Signed)
FYI

## 2018-11-09 NOTE — Telephone Encounter (Signed)
Dr. Krasowski aware.  

## 2018-11-13 ENCOUNTER — Other Ambulatory Visit: Payer: Self-pay

## 2018-11-26 ENCOUNTER — Encounter: Payer: Self-pay | Admitting: Podiatry

## 2018-11-26 NOTE — Progress Notes (Signed)
Subjective: Molly Meyer presents today , referred by Dr. [name] for diabetic foot exam. She complains of elongated,  painful, thick toenails 1-5 b/l that she cannot cut and which interfere with daily activities. Duration in greater than 1 month. Pain is aggravated when wearing enclosed shoe gear.   She denies any numbness, tingling or burning in her feet today.  She denies any history of foot wounds.  Past Medical History:  Diagnosis Date  . Degenerative disc disease, lumbar   . Diabetes mellitus without complication ([place])   . Hypertension    Patient Active Problem List   Diagnosis Date Noted  . AAA (abdominal aortic aneurysm) without rupture ([place]) 10/16/2018  . Type 2 diabetes mellitus with chronic kidney disease ([place]) 09/15/2018  . Benign hypertension with CKD (chronic kidney disease), stage II 09/15/2018  . Nephropathy 09/15/2018  . Onychomycosis 01/01/2015  . Subluxation of foot, acquired 01/01/2015  . Pain in lower limb 01/01/2015  . Diabetic neuropathy associated with diabetes mellitus due to underlying condition ([place]) 01/01/2015  . Disorder of kidney 07/21/2014  . Malaise and fatigue 05/05/2014  . Disorder of nasal cavity 04/28/2014  . Acute upper respiratory infection 04/28/2014  . Basal cell papilloma 10/18/2013  . Diabetic kidney ([place]) 10/18/2013  . Benign hypertensive kidney disease 10/18/2013  . Chronic kidney disease (CKD), stage II (mild) 10/18/2013  . Hypercholesterolemia without hypertriglyceridemia 10/18/2013  . Disorder of bone and articular cartilage 10/18/2013  . Arthralgia of multiple joints 10/18/2013  . Frank hematuria 10/18/2013  . Excess weight 10/17/2013   Past Surgical History:  Procedure Laterality Date  . KNEE SURGERY     Current Outpatient Medications:  .  acetaminophen (TYLENOL) 500 MG tablet, Take 2 tablets (1,000 mg total) by mouth every 6 (six) hours as needed for mild pain or moderate pain., Disp: 30 tablet, Rfl: 0 .   Cholecalciferol (VITAMIN D3) 2000 UNITS capsule, Take 2,000 Units by mouth daily. 1 qd, Disp: , Rfl:  .  docusate sodium (COLACE) 100 MG capsule, Take 100 mg by mouth daily as needed for mild constipation., Disp: , Rfl:  .  glucose blood (ACCU-CHEK GUIDE) test strip, Use as instructed to check blood sugars 1 time per day dx: e11.22, Disp: 100 each, Rfl: 11 .  Lancets Misc. (ACCU-CHEK FASTCLIX LANCET) KIT, USE TO CHECK BS DAILY, DX: 250.02, Disp: , Rfl:  .  lisinopril-hydrochlorothiazide (PRINZIDE,ZESTORETIC) 10-12.5 MG per tablet, Take 1 tablet by mouth daily., Disp: , Rfl:  .  mirabegron ER (MYRBETRIQ) 25 MG TB24 tablet, Take 25 mg by mouth daily., Disp: , Rfl:  .  simvastatin (ZOCOR) 20 MG tablet, TAKE 1 TABLET BY MOUTH ONCE EVERY EVENING, Disp: 90 tablet, Rfl: 0  Allergies  Allergen Reactions  . Penicillins Rash    Thinks she may be able to take some forms now.Has patient had a PCN reaction causing immediate rash, facial/tongue/throat swelling, SOB or lightheadedness with hypotension: No Has patient had a PCN reaction causing severe rash involving mucus membranes or skin necrosis: No Has patient had a PCN reaction that required hospitalization No Has patient had a PCN reaction occurring within the last 10 years: No If all of the above answers are "NO", then may proceed with Cephalosporin use.   Social History   Tobacco Use  Smoking Status Never Smoker  Smokeless Tobacco Never Used   Family History  Problem Relation Age of Onset  . Hyperlipidemia Father     Objective: Vitals:   11/07/18 1351  BP: 132/72  Pulse: 75   Vascular Examination: Capillary refill time is immediate to all 10 digits. Dorsalis pedis and Posterior tibial pulses are palpable bilaterally Digital hair x 10 digits decreased bilaterally Skin temperature gradient within normal limits bilaterally  Dermatological Examination: Skin is thin, shiny, and atrophic bilaterally  Toenails 1-5 b/l discolored, thick,  dystrophic with subungual debris and pain with palpation to nailbeds due to thickness of nails.  Musculoskeletal: Muscle strength 5/5 to all LE muscle groups Pes planus foot type noted with medial midfoot collapse left greater than right.  No abnormal temperature change noted bilaterally. Hammertoes 2 through 5 bilaterally Limb length discrepancy noted left lower extremity longer than right  Neurological: Sensation decreased with 10 gram monofilament bilaterally Vibratory sensation absent bilaterally  Assessment: 1.  Painful onychomycosis toenails 1-5 b/l  2.  Pes planus foot type bilaterally with collapse midfoot 3.  Diabetes with neuropathy  Plan: 1. Toenails 1-5 b/l were debrided in length and girth without iatrogenic bleeding. 2. Patient qualifies for Medicare benefit of diabetic shoes.  Her supporting diagnoses are diabetes with peripheral neuropathy, hammertoe deformities 2 through 5 bilaterally pes planus foot type with midfoot collapse left greater than right.  She will be scheduled with our Pedorthist, Molly Meyer, for evaluation and shoe measurement. 3. Patient to continue soft, supportive shoe gear 4. Patient to report any pedal injuries to medical professional immediately. 5. Follow up 3 months. Patient/POA to call should there be a concern in the interim.

## 2019-01-22 ENCOUNTER — Ambulatory Visit: Payer: Medicare Other | Admitting: Cardiology

## 2019-01-30 ENCOUNTER — Encounter: Payer: Self-pay | Admitting: Internal Medicine

## 2019-01-30 ENCOUNTER — Ambulatory Visit (INDEPENDENT_AMBULATORY_CARE_PROVIDER_SITE_OTHER): Payer: Medicare Other | Admitting: Internal Medicine

## 2019-01-30 VITALS — BP 126/82 | HR 75 | Temp 97.7°F | Ht 65.8 in | Wt 172.4 lb

## 2019-01-30 DIAGNOSIS — I129 Hypertensive chronic kidney disease with stage 1 through stage 4 chronic kidney disease, or unspecified chronic kidney disease: Secondary | ICD-10-CM

## 2019-01-30 DIAGNOSIS — N182 Chronic kidney disease, stage 2 (mild): Secondary | ICD-10-CM | POA: Diagnosis not present

## 2019-01-30 DIAGNOSIS — I714 Abdominal aortic aneurysm, without rupture, unspecified: Secondary | ICD-10-CM

## 2019-01-30 DIAGNOSIS — E1122 Type 2 diabetes mellitus with diabetic chronic kidney disease: Secondary | ICD-10-CM

## 2019-01-30 DIAGNOSIS — K5909 Other constipation: Secondary | ICD-10-CM | POA: Diagnosis not present

## 2019-01-30 DIAGNOSIS — N183 Chronic kidney disease, stage 3 (moderate): Secondary | ICD-10-CM | POA: Diagnosis not present

## 2019-01-30 NOTE — Patient Instructions (Signed)

## 2019-01-30 NOTE — Progress Notes (Signed)
Subjective:     Patient ID: Molly Meyer , female    DOB: 1924-03-08 , 85 y.o.   MRN: [id number]   Chief Complaint  Patient presents with  . Diabetes  . Hypertension    HPI  Diabetes  She presents for her follow-up diabetic visit. She has type 2 diabetes mellitus. Her disease course has been stable. There are no hypoglycemic associated symptoms. Pertinent negatives for diabetes include no blurred vision and no chest pain. There are no hypoglycemic complications. Diabetic complications include nephropathy. Risk factors for coronary artery disease include diabetes mellitus, dyslipidemia, hypertension, post-menopausal and sedentary lifestyle. Her weight is stable. Her breakfast blood glucose is taken between 8-9 am. Her breakfast blood glucose range is generally 90-110 mg/dl. An ACE inhibitor/angiotensin II receptor blocker is being taken. She sees a podiatrist.Eye exam is current.  Hypertension  This is a chronic problem. The current episode started more than 1 year ago. The problem has been gradually improving since onset. The problem is controlled. Pertinent negatives include no blurred vision, chest pain, palpitations or shortness of breath.   She reports compliance with meds.   Past Medical History:  Diagnosis Date  . Degenerative disc disease, lumbar   . Diabetes mellitus without complication ([place])   . Hypertension      Family History  Problem Relation Age of Onset  . Hyperlipidemia Father      Current Outpatient Medications:  .  acetaminophen (TYLENOL) 500 MG tablet, Take 2 tablets (1,000 mg total) by mouth every 6 (six) hours as needed for mild pain or moderate pain., Disp: 30 tablet, Rfl: 0 .  Cholecalciferol (VITAMIN D3) 125 MCG (5000 UT) TBDP, Take by mouth., Disp: , Rfl:  .  docusate sodium (COLACE) 100 MG capsule, Take 100 mg by mouth daily as needed for mild constipation., Disp: , Rfl:  .  glucose blood (ACCU-CHEK GUIDE) test strip, Use as instructed to check blood  sugars 1 time per day dx: e11.22, Disp: 100 each, Rfl: 11 .  Lancets Misc. (ACCU-CHEK FASTCLIX LANCET) KIT, USE TO CHECK BS DAILY, DX: 250.02, Disp: , Rfl:  .  linaclotide (LINZESS) 72 MCG capsule, Take 72 mcg by mouth daily before breakfast., Disp: , Rfl:  .  lisinopril-hydrochlorothiazide (PRINZIDE,ZESTORETIC) 10-12.5 MG per tablet, Take 1 tablet by mouth daily., Disp: , Rfl:  .  mirabegron ER (MYRBETRIQ) 25 MG TB24 tablet, Take 25 mg by mouth daily., Disp: , Rfl:  .  simvastatin (ZOCOR) 20 MG tablet, TAKE 1 TABLET BY MOUTH ONCE EVERY EVENING, Disp: 90 tablet, Rfl: 0 .  Cholecalciferol (VITAMIN D3) 2000 UNITS capsule, Take 2,000 Units by mouth daily. 1 qd, Disp: , Rfl:    Allergies  Allergen Reactions  . Penicillins Rash    Thinks she may be able to take some forms now.Has patient had a PCN reaction causing immediate rash, facial/tongue/throat swelling, SOB or lightheadedness with hypotension: No Has patient had a PCN reaction causing severe rash involving mucus membranes or skin necrosis: No Has patient had a PCN reaction that required hospitalization No Has patient had a PCN reaction occurring within the last 10 years: No If all of the above answers are "NO", then may proceed with Cephalosporin use.     Review of Systems  Constitutional: Negative.   Eyes: Negative for blurred vision.  Respiratory: Negative.  Negative for shortness of breath.   Cardiovascular: Negative.  Negative for chest pain and palpitations.  Gastrointestinal: Positive for constipation (She c/o constipation. Needs refill of Linzess.  She only uses 1-2x per week. ).  Neurological: Negative.   Psychiatric/Behavioral: Negative.      Today's Vitals   01/30/19 1007  BP: 126/82  Pulse: 75  Temp: 97.7 F (36.5 C)  TempSrc: Oral  Weight: 172 lb 6.4 oz (78.2 kg)  Height: 5' 5.8" (1.671 m)  PainSc: 7   PainLoc: Shoulder   Body mass index is 28 kg/m.   Objective:  Physical Exam Vitals signs and nursing note  reviewed.  Constitutional:      Appearance: Normal appearance.  HENT:     Head: Normocephalic and atraumatic.  Cardiovascular:     Rate and Rhythm: Normal rate and regular rhythm.     Heart sounds: Normal heart sounds.  Pulmonary:     Effort: Pulmonary effort is normal.     Breath sounds: Normal breath sounds.  Skin:    General: Skin is warm.  Neurological:     General: No focal deficit present.     Mental Status: She is alert and oriented to person, place, and time.  Psychiatric:        Mood and Affect: Mood normal.         Assessment And Plan:     1. Type 2 diabetes mellitus with stage 2 chronic kidney disease, without long-term current use of insulin ([place])  I will check labs as listed below. She is encouraged to continue with dietary compliance.  She is also encouraged to stay well hydrated. She will rto in four months for re-evaluation.   - BMP8+EGFR - Hemoglobin A1c - Lipid Profile  2. Benign hypertension with CKD (chronic kidney disease), stage II  Well controlled.  She will continue with current meds. She is encouraged to avoid adding salt to her foods.   3. Chronic constipation  She was given samples of Linzess '72mg'$  once daily as needed.   4. AAA (abdominal aortic aneurysm) without rupture ([facility])  Chronic.  She is followed by Vascular.   Maximino Greenland, MD

## 2019-01-31 LAB — BMP8+EGFR
BUN/Creatinine Ratio: 17 (ref 12–28)
BUN: 13 mg/dL (ref 10–36)
CO2: 25 mmol/L (ref 20–29)
Calcium: 9.7 mg/dL (ref 8.7–10.3)
Chloride: 103 mmol/L (ref 96–106)
Creatinine, Ser: 0.77 mg/dL (ref 0.57–1.00)
GFR calc Af Amer: 78 mL/min/{1.73_m2} (ref >59)
GFR calc non Af Amer: 67 mL/min/{1.73_m2} (ref >59)
Glucose: 123 mg/dL — ABNORMAL HIGH (ref 65–99)
Potassium: 4.2 mmol/L (ref 3.5–5.2)
Sodium: 144 mmol/L (ref 134–144)

## 2019-01-31 LAB — LIPID PANEL
Chol/HDL Ratio: 3.4 ratio (ref 0.0–4.4)
Cholesterol, Total: 166 mg/dL (ref 100–199)
HDL: 49 mg/dL (ref >39)
LDL Calculated: 96 mg/dL (ref 0–99)
Triglycerides: 103 mg/dL (ref 0–149)
VLDL Cholesterol Cal: 21 mg/dL (ref 5–40)

## 2019-01-31 LAB — HEMOGLOBIN A1C
Est. average glucose Bld gHb Est-mCnc: 134 mg/dL
Hgb A1c MFr Bld: 6.3 % — ABNORMAL HIGH (ref 4.8–5.6)

## 2019-02-06 ENCOUNTER — Ambulatory Visit: Payer: Medicare Other | Admitting: Podiatry

## 2019-02-13 ENCOUNTER — Ambulatory Visit (INDEPENDENT_AMBULATORY_CARE_PROVIDER_SITE_OTHER): Payer: Medicare Other | Admitting: Podiatry

## 2019-02-13 DIAGNOSIS — E1142 Type 2 diabetes mellitus with diabetic polyneuropathy: Secondary | ICD-10-CM

## 2019-02-13 DIAGNOSIS — M2142 Flat foot [pes planus] (acquired), left foot: Secondary | ICD-10-CM

## 2019-02-13 DIAGNOSIS — M2141 Flat foot [pes planus] (acquired), right foot: Secondary | ICD-10-CM

## 2019-02-13 DIAGNOSIS — M79675 Pain in left toe(s): Secondary | ICD-10-CM

## 2019-02-13 DIAGNOSIS — B351 Tinea unguium: Secondary | ICD-10-CM | POA: Diagnosis not present

## 2019-02-13 DIAGNOSIS — M2010 Hallux valgus (acquired), unspecified foot: Secondary | ICD-10-CM

## 2019-02-13 DIAGNOSIS — M79674 Pain in right toe(s): Secondary | ICD-10-CM | POA: Diagnosis not present

## 2019-02-13 NOTE — Patient Instructions (Signed)
Onychomycosis/Fungal Toenails  WHAT IS IT? An infection that lies within the keratin of your nail plate that is caused by a fungus.  WHY ME? Fungal infections affect all ages, sexes, races, and creeds.  There may be many factors that predispose you to a fungal infection such as age, coexisting medical conditions such as diabetes, or an autoimmune disease; stress, medications, fatigue, genetics, etc.  Bottom line: fungus thrives in a warm, moist environment and your shoes offer such a location.  IS IT CONTAGIOUS? Theoretically, yes.  You do not want to share shoes, nail clippers or files with someone who has fungal toenails.  Walking around barefoot in the same room or sleeping in the same bed is unlikely to transfer the organism.  It is important to realize, however, that fungus can spread easily from one nail to the next on the same foot.  HOW DO WE TREAT THIS?  There are several ways to treat this condition.  Treatment may depend on many factors such as age, medications, pregnancy, liver and kidney conditions, etc.  It is best to ask your doctor which options are available to you.  1. No treatment.   Unlike many other medical concerns, you can live with this condition.  However for many people this can be a painful condition and may lead to ingrown toenails or a bacterial infection.  It is recommended that you keep the nails cut short to help reduce the amount of fungal nail. 2. Topical treatment.  These range from herbal remedies to prescription strength nail lacquers.  About 40-50% effective, topicals require twice daily application for approximately 9 to 12 months or until an entirely new nail has grown out.  The most effective topicals are medical grade medications available through physicians offices. 3. Oral antifungal medications.  With an 80-90% cure rate, the most common oral medication requires 3 to 4 months of therapy and stays in your system for a year as the new nail grows out.  Oral  antifungal medications do require blood work to make sure it is a safe drug for you.  A liver function panel will be performed prior to starting the medication and after the first month of treatment.  It is important to have the blood work performed to avoid any harmful side effects.  In general, this medication safe but blood work is required. 4. Laser Therapy.  This treatment is performed by applying a specialized laser to the affected nail plate.  This therapy is noninvasive, fast, and non-painful.  It is not covered by insurance and is therefore, out of pocket.  The results have been very good with a 80-95% cure rate.  [facility] is the only practice in the area to offer this therapy. Permanent Nail Avulsion.  Removing the entire nail so that a new nail will not grow back.Flat Feet, Adult  Normally, a foot has a curve, called an arch, on its inner side. The arch creates a gap between the foot and the ground. Flat feet is a common condition in which one or both feet do not have an arch. What are the causes? This condition may be caused by:  Failure of a normal arch to develop during childhood.  An injury to tendons and ligaments in the foot, such as to the tendon that supports the arch (posterior tibial tendon).  Loose tendons or ligaments in the foot.  A wearing down of the arch over time.  Injury to bones in the foot.  An abnormality in the bones of the foot, called tarsal coalition. This happens when two or more bones in the foot are joined together (fused) before birth. What increases the risk? This condition is more likely to develop in:  Females.  Adults age 10 or older.  People who: ? Have a family history of flat feet. ? Have a history of childhood flexible flatfoot. ? Are obese. ? Have diabetes. ? Have high blood pressure. ? Participate in high-impact sports. ? Have inflammatory arthritis. ? Have a history of broken (fractured) or dislocated bones in the  foot. What are the signs or symptoms? Symptoms of this condition include:  Pain or tightness along the bottom of the foot.  Foot pain that gets worse with activity.  Swelling of the inner side of the foot.  Swelling of the ankle.  Pain on the outer side of the ankle.  Changes in the way that you walk (gait).  Pronation. This is when the foot and ankle lean inward when you are standing.  Bony bumps on the top or inner side of the foot. How is this diagnosed? This condition is diagnosed with a physical exam of your foot and ankle. Your health care provider may also:  Look at your shoes for patterns of wear on the soles.  Order imaging tests, such as X-rays, a CT scan, or an MRI.  Refer you to a health care provider who specializes in feet (podiatrist) or a physical therapist. How is this treated? This condition may be treated with:  Stretching exercises or physical therapy. This helps to increase range of motion and relieve pain.  A shoe insert (orthotic). This helps to support the arch of your foot. Orthotics can be purchased from a store or can be custom-made by your health care provider.  Wearing shoes with appropriate arch support. This is especially important for athletes.  Medicines. These may be prescribed to relieve pain.  An ankle brace, boot, or cast. These may be used to relieve pressure on your foot. You may be given crutches if walking is painful.  Surgery. This may be done to improve the alignment of your foot. This is only needed if your posterior tibial tendon is torn or if you have tarsal coalition. Follow these instructions at home: Activity  Do any exercises as told by your health care provider.  If an activity causes pain, avoid it or try to find another activity that does not cause pain. General instructions  Wear orthotics and appropriate shoes as told by your health care provider.  Take over-the-counter and prescription medicines only as told by  your health care provider.  Wear an ankle brace, boot, or cast as told by your health care provider.  Use crutches as told by your health care provider.  Keep all follow-up visits as told by your health care provider. This is important. How is this prevented? To prevent the condition from getting worse:  Wear comfortable, supportive shoes that are appropriate for your activities.  Maintain a healthy weight.  Stay active in a way that your health care provider recommends. This will help to keep your feet flexible and strong.  Manage long-term (chronic) health conditions, such as diabetes, high blood pressure, and inflammatory arthritis.  Work with a health care provider if you have concerns about your feet or shoes. Contact a health care provider if:  You have pain in your foot or lower leg that gets worse or does not improve with medicine.  You  have pain or difficulty when walking.  You have problems with your orthotics. Summary  Flat feet is a common condition in which one or both feet do not have a curve, called an arch, on the inner side.  Your health care provider may recommend a shoe insert (orthotic) or shoes with the appropriate arch support.  Other treatments may include stretching exercises or physical therapy, medicines to relieve pain, and wearing an ankle brace, boot, or cast.  Surgery may be done if you have a tear in the tendon that supports your arch (posterior tibial tendon) or if two or more of your foot bones were joined together (fused)  before birth (tarsal coalition). This information is not intended to replace advice given to you by your health care provider. Make sure you discuss any questions you have with your health care provider. Document Released: 10/10/2009 Document Revised: 02/23/2017 Document Reviewed: 02/23/2017 Elsevier Interactive Patient Education  2019 [employer].

## 2019-02-16 ENCOUNTER — Ambulatory Visit (INDEPENDENT_AMBULATORY_CARE_PROVIDER_SITE_OTHER): Payer: Medicare Other | Admitting: Cardiology

## 2019-02-16 ENCOUNTER — Encounter: Payer: Self-pay | Admitting: Cardiology

## 2019-02-16 ENCOUNTER — Telehealth: Payer: Self-pay | Admitting: Emergency Medicine

## 2019-02-16 VITALS — BP 116/64 | HR 83 | Ht 67.0 in | Wt 173.0 lb

## 2019-02-16 DIAGNOSIS — E1122 Type 2 diabetes mellitus with diabetic chronic kidney disease: Secondary | ICD-10-CM

## 2019-02-16 DIAGNOSIS — I714 Abdominal aortic aneurysm, without rupture, unspecified: Secondary | ICD-10-CM

## 2019-02-16 DIAGNOSIS — N182 Chronic kidney disease, stage 2 (mild): Secondary | ICD-10-CM | POA: Diagnosis not present

## 2019-02-16 DIAGNOSIS — I129 Hypertensive chronic kidney disease with stage 1 through stage 4 chronic kidney disease, or unspecified chronic kidney disease: Secondary | ICD-10-CM

## 2019-02-16 NOTE — Telephone Encounter (Signed)
Left message for patient to return call. She needs to increase simvastatin to 40 mg. I will continue efforts.

## 2019-02-16 NOTE — Progress Notes (Signed)
Cardiology Office Note:    Date:  02/16/2019   ID:  JAICE LAGUE, DOB Jul 09, 1924, MRN [id number]  PCP:  [name], MD  Cardiologist:  Jenne Campus, MD    Referring MD: [name], MD   Chief Complaint  Patient presents with  . Follow-up  Am doing fine  History of Present Illness:    Molly Meyer is a 85 y.o. female with abdominal ultrasound was measuring 5.6 cm in the summer of last year initially she was sent to me for evaluation before potential surgery however eventually decision has been made to simply continue conservative management of this problem apparently did position of the aneurysm so close to the renal artery that can create some problem therefore, all test that we were scheduled to do to evaluate her heart before surgery been counseled overall she is doing well denies have any chest pain tightness squeezing pressure burning chest.  The biggest complaint she has is the fact that she got a lot of pain in her legs and ankles she had difficulty walking she used a cane.  She still quite sharp on her mind.  She does use a hearing aid.  Past Medical History:  Diagnosis Date  . Degenerative disc disease, lumbar   . Diabetes mellitus without complication ([place])   . Hypertension     Past Surgical History:  Procedure Laterality Date  . KNEE SURGERY      Current Medications: Current Meds  Medication Sig  . acetaminophen (TYLENOL) 500 MG tablet Take 2 tablets (1,000 mg total) by mouth every 6 (six) hours as needed for mild pain or moderate pain.  . Cholecalciferol (VITAMIN D3) 2000 UNITS capsule Take 2,000 Units by mouth daily. 1 qd  . glucose blood (ACCU-CHEK GUIDE) test strip Use as instructed to check blood sugars 1 time per day dx: e11.22  . Lancets Misc. (ACCU-CHEK FASTCLIX LANCET) KIT USE TO CHECK BS DAILY, DX: 250.02  . linaclotide (LINZESS) 72 MCG capsule Take 72 mcg by mouth daily before breakfast.  . lisinopril-hydrochlorothiazide  (PRINZIDE,ZESTORETIC) 10-12.5 MG per tablet Take 1 tablet by mouth daily.  . mirabegron ER (MYRBETRIQ) 25 MG TB24 tablet Take 25 mg by mouth daily.  . simvastatin (ZOCOR) 20 MG tablet TAKE 1 TABLET BY MOUTH ONCE EVERY EVENING     Allergies:   Penicillins   Social History   Socioeconomic History  . Marital status: Widowed    Spouse name: Not on file  . Number of children: Not on file  . Years of education: Not on file  . Highest education level: Not on file  Occupational History  . Not on file  Social Needs  . Financial resource strain: Not hard at all  . Food insecurity:    Worry: Never true    Inability: Never true  . Transportation needs:    Medical: No    Non-medical: No  Tobacco Use  . Smoking status: Never Smoker  . Smokeless tobacco: Never Used  Substance and Sexual Activity  . Alcohol use: No    Alcohol/week: 0.0 standard drinks  . Drug use: Never  . Sexual activity: Not Currently  Lifestyle  . Physical activity:    Days per week: 0 days    Minutes per session: 0 min  . Stress: Not at all  Relationships  . Social connections:    Talks on phone: Not on file    Gets together: Not on file    Attends religious service: Not on file  Active member of club or organization: Not on file    Attends meetings of clubs or organizations: Not on file    Relationship status: Not on file  Other Topics Concern  . Not on file  Social History Narrative  . Not on file     Family History: The patient's family history includes Hyperlipidemia in her father. ROS:   Please see the history of present illness.    All 14 point review of systems negative except as described per history of present illness  EKGs/Labs/Other Studies Reviewed:      Recent Labs: 09/28/2018: ALT 10 01/30/2019: BUN 13; Creatinine, Ser 0.77; Potassium 4.2; Sodium 144  Recent Lipid Panel    Component Value Date/Time   CHOL 166 01/30/2019 1038   TRIG 103 01/30/2019 1038   HDL 49 01/30/2019 1038    CHOLHDL 3.4 01/30/2019 1038   LDLCALC 96 01/30/2019 1038    Physical Exam:    VS:  BP 116/64   Pulse 83   Ht '5\' 7"'$  (1.702 m)   Wt 173 lb (78.5 kg)   SpO2 96%   BMI 27.10 kg/m     Wt Readings from Last 3 Encounters:  02/16/19 173 lb (78.5 kg)  01/30/19 172 lb 6.4 oz (78.2 kg)  10/16/18 169 lb 12.8 oz (77 kg)     GEN:  Well nourished, well developed in no acute distress HEENT: Normal NECK: No JVD; No carotid bruits LYMPHATICS: No lymphadenopathy CARDIAC: RRR, no murmurs, no rubs, no gallops RESPIRATORY:  Clear to auscultation without rales, wheezing or rhonchi  ABDOMEN: Soft, non-tender, non-distended MUSCULOSKELETAL:  No edema; No deformity  SKIN: Warm and dry LOWER EXTREMITIES: no swelling NEUROLOGIC:  Alert and oriented x 3 PSYCHIATRIC:  Normal affect   ASSESSMENT:    1. AAA (abdominal aortic aneurysm) without rupture ([place])   2. Benign hypertension with CKD (chronic kidney disease), stage II   3. Type 2 diabetes mellitus with stage 2 chronic kidney disease, without long-term current use of insulin ([facility])    PLAN:    In order of problems listed above:  1. Abdominal aortic aneurysm.  Follow-up by vascular surgeon.  My understanding is that she is scheduled to have appointment on 20 April. 2. Essential hypertension blood pressure well controlled continue present management. 3. Dyslipidemia last cholesterol is still elevated.  I think she may benefit from increasing dose of simvastatin all different statin.  However she is convinced that she takes already 40 mg of statin.  The dose I have written in the chart is only 20.  Call pharmacy investigated. 4. Diabetes apparently stable.   Medication Adjustments/Labs and Tests Ordered: Current medicines are reviewed at length with the patient today.  Concerns regarding medicines are outlined above.  No orders of the defined types were placed in this encounter.  Medication changes: No orders of the defined types were placed in  this encounter.   Signed, Park Liter, MD, [facility] 02/16/2019 2:59 PM    [place]

## 2019-02-16 NOTE — Patient Instructions (Signed)

## 2019-02-19 MED ORDER — SIMVASTATIN 20 MG PO TABS: 40.0000 mg | ORAL_TABLET | Freq: Every day | ORAL | 1 refills | Status: DC

## 2019-02-19 NOTE — Addendum Note (Signed)
Addended by: Ashok Norris on: 02/19/2019 08:17 AM   Modules accepted: Orders

## 2019-02-19 NOTE — Telephone Encounter (Signed)
Patient informed to take 40 mg of simvastatin daily. She verbally understands.

## 2019-02-22 ENCOUNTER — Encounter: Payer: Self-pay | Admitting: Podiatry

## 2019-02-22 NOTE — Progress Notes (Signed)
Subjective: Molly Meyer presents today with history of diabetic neuropathy with cc of painful, mycotic toenails.  Pain is aggravated when wearing enclosed shoe gear and relieved with periodic professional debridement.  Molly Chard, MD is her PCP.  Last visit January 30, 2019.  Patient informs me on todayDr. Baird Cancer Dr. Baird Cancer would like diabetic shoe paperwork faxed to her office and she will sign it.    Current Outpatient Medications:  .  acetaminophen (TYLENOL) 500 MG tablet, Take 2 tablets (1,000 mg total) by mouth every 6 (six) hours as needed for mild pain or moderate pain., Disp: 30 tablet, Rfl: 0 .  Cholecalciferol (VITAMIN D3) 2000 UNITS capsule, Take 2,000 Units by mouth daily. 1 qd, Disp: , Rfl:  .  glucose blood (ACCU-CHEK GUIDE) test strip, Use as instructed to check blood sugars 1 time per day dx: e11.22, Disp: 100 each, Rfl: 11 .  Lancets Misc. (ACCU-CHEK FASTCLIX LANCET) KIT, USE TO CHECK BS DAILY, DX: 250.02, Disp: , Rfl:  .  linaclotide (LINZESS) 72 MCG capsule, Take 72 mcg by mouth daily before breakfast., Disp: , Rfl:  .  lisinopril-hydrochlorothiazide (PRINZIDE,ZESTORETIC) 10-12.5 MG per tablet, Take 1 tablet by mouth daily., Disp: , Rfl:  .  mirabegron ER (MYRBETRIQ) 25 MG TB24 tablet, Take 25 mg by mouth daily., Disp: , Rfl:  .  simvastatin (ZOCOR) 20 MG tablet, Take 2 tablets (40 mg total) by mouth daily., Disp: 90 tablet, Rfl: 1  Allergies  Allergen Reactions  . Penicillins Rash    Thinks she may be able to take some forms now.Has patient had a PCN reaction causing immediate rash, facial/tongue/throat swelling, SOB or lightheadedness with hypotension: No Has patient had a PCN reaction causing severe rash involving mucus membranes or skin necrosis: No Has patient had a PCN reaction that required hospitalization No Has patient had a PCN reaction occurring within the last 10 years: No If all of the above answers are "NO", then may proceed with Cephalosporin use.     Objective:  Vascular Examination: Capillary refill time immediate x 10 digits.  Dorsalis pedis and Posterior tibial pulses palpable bilaterally.  Digital hair x 10 digits was decreased.  Skin temperature gradient WNL b/l.  Dermatological Examination: Pedal skin is thin, shiny, and atrophic bilaterally.  Toenails 1-5 b/l discolored, thick, dystrophic with subungual debris and pain with palpation to nailbeds due to thickness of nails.  Musculoskeletal: Muscle strength 5/5 to all muscle groups b/l.  Pes planus foot type noted bilaterally.  There is medial midfoot collapse left greater than right.  No abnormal temperature change or erythema noted.  Hallux abductovalgus with bunion deformity bilaterally  Hammertoes 2 through 5 bilaterally.  Limb length discrepancy noted left lower extremity longer than right.  Neurological: Sensation with 10 gram monofilament is absent b/l.  Assessment: 1. Painful onychomycosis toenails 1-5 b/l 2. NIDDM with neuropathy 3. Pes planus foot type with medial midfoot collapse left greater than right with no temperature change to indicate acute Charcot. 4. Hallux abductovalgus with bunion deformity bilateral and hammertoes 2 through 5 bilaterally 5. Limb length discrepancy  Plan: 1. Toenails 1-5 b/l were debrided in length and girth without iatrogenic bleeding. 2. Patient to continue soft, supportive shoe gear.  We will start diabetic shoe process for her on today.   Supporting diagnoses for diabetic shoes are: NIDDM with neuropathy  HAV with bunion deformity b/l  Hammertoe 2-5 b/l  Pes planus b/l with midfoot collapse  Limb length discrepancy left lower extremity longer than  right LE  3. Patient to report any pedal injuries to medical professional immediately. 4. Follow up 3 months.  5. Patient/POA to call should there be a concern in the interim.

## 2019-03-06 ENCOUNTER — Other Ambulatory Visit: Payer: Self-pay | Admitting: Internal Medicine

## 2019-03-06 MED ORDER — LISINOPRIL-HYDROCHLOROTHIAZIDE 10-12.5 MG PO TABS: 1.0000 | ORAL_TABLET | Freq: Every day | ORAL | 2 refills | Status: DC

## 2019-03-09 ENCOUNTER — Ambulatory Visit: Payer: Medicare Other | Admitting: Orthotics

## 2019-03-09 ENCOUNTER — Other Ambulatory Visit: Payer: Self-pay

## 2019-03-19 ENCOUNTER — Other Ambulatory Visit: Payer: Self-pay | Admitting: Internal Medicine

## 2019-04-12 ENCOUNTER — Other Ambulatory Visit: Payer: Self-pay | Admitting: Internal Medicine

## 2019-04-12 ENCOUNTER — Other Ambulatory Visit: Payer: Self-pay

## 2019-04-12 MED ORDER — ACCU-CHEK FASTCLIX LANCETS MISC: 11 refills | Status: DC

## 2019-04-16 ENCOUNTER — Other Ambulatory Visit: Payer: Self-pay

## 2019-04-16 MED ORDER — ACCU-CHEK FASTCLIX LANCETS MISC: 11 refills | Status: DC

## 2019-04-23 ENCOUNTER — Other Ambulatory Visit: Payer: Self-pay

## 2019-04-23 ENCOUNTER — Other Ambulatory Visit: Payer: Self-pay | Admitting: Internal Medicine

## 2019-04-23 MED ORDER — ACCU-CHEK MULTICLIX LANCET DEV KIT: PACK | 1 refills | Status: DC

## 2019-04-23 MED ORDER — ACCU-CHEK MULTICLIX LANCETS MISC: 1 refills | Status: DC

## 2019-04-24 ENCOUNTER — Other Ambulatory Visit: Payer: Self-pay

## 2019-04-24 ENCOUNTER — Ambulatory Visit: Payer: Medicare Other | Admitting: Vascular Surgery

## 2019-04-24 MED ORDER — ACCU-CHEK SOFTCLIX LANCETS MISC: 2 refills | Status: DC

## 2019-04-24 MED ORDER — ACCU-CHEK SOFTCLIX LANCET DEV KIT: PACK | 1 refills | Status: DC

## 2019-04-26 ENCOUNTER — Other Ambulatory Visit: Payer: Self-pay

## 2019-04-26 MED ORDER — ACCU-CHEK SOFTCLIX LANCET DEV KIT: PACK | 1 refills | Status: DC

## 2019-05-15 ENCOUNTER — Other Ambulatory Visit: Payer: Self-pay

## 2019-05-15 ENCOUNTER — Ambulatory Visit (INDEPENDENT_AMBULATORY_CARE_PROVIDER_SITE_OTHER): Payer: Medicare Other | Admitting: Podiatry

## 2019-05-15 DIAGNOSIS — M79675 Pain in left toe(s): Secondary | ICD-10-CM

## 2019-05-15 DIAGNOSIS — E1142 Type 2 diabetes mellitus with diabetic polyneuropathy: Secondary | ICD-10-CM | POA: Diagnosis not present

## 2019-05-15 DIAGNOSIS — B351 Tinea unguium: Secondary | ICD-10-CM

## 2019-05-15 DIAGNOSIS — M79674 Pain in right toe(s): Secondary | ICD-10-CM

## 2019-05-15 NOTE — Patient Instructions (Signed)

## 2019-05-22 ENCOUNTER — Encounter: Payer: Self-pay | Admitting: Podiatry

## 2019-05-22 NOTE — Progress Notes (Signed)
Subjective:  Molly Meyer presents to clinic today with cc of  painful, thick, discolored, elongated toenails 1-5 b/l that become tender and cannot cut because of thickness.  Pain is aggravated when wearing enclosed shoe gear.  [name], MD is her PCP and last visit was 01/30/2019.   Current Outpatient Medications:  .  ACCU-CHEK SMARTVIEW test strip, CHECK BLOOD SUGARS ONCE BEFORE BREAKFAST DX: E11.65, Disp: 100 each, Rfl: 11 .  Accu-Chek Softclix Lancets lancets, Use as instructed to check blood sugars 1 time per day dx: e11.22, Disp: 150 each, Rfl: 2 .  acetaminophen (TYLENOL) 500 MG tablet, Take 2 tablets (1,000 mg total) by mouth every 6 (six) hours as needed for mild pain or moderate pain., Disp: 30 tablet, Rfl: 0 .  Cholecalciferol (VITAMIN D3) 2000 UNITS capsule, Take 2,000 Units by mouth daily. 1 qd, Disp: , Rfl:  .  Lancets Misc. (ACCU-CHEK SOFTCLIX LANCET DEV) KIT, Use as directed to check blood sugars 1 time per day dx: e11.22, Disp: 1 kit, Rfl: 1 .  linaclotide (LINZESS) 72 MCG capsule, Take 72 mcg by mouth daily before breakfast., Disp: , Rfl:  .  lisinopril-hydrochlorothiazide (PRINZIDE,ZESTORETIC) 10-12.5 MG tablet, Take 1 tablet by mouth daily., Disp: 90 tablet, Rfl: 2 .  mirabegron ER (MYRBETRIQ) 25 MG TB24 tablet, Take 25 mg by mouth daily., Disp: , Rfl:  .  simvastatin (ZOCOR) 20 MG tablet, TAKE 1 TABLET BY MOUTH ONCE EVERY EVENING, Disp: 90 tablet, Rfl: 2   Allergies  Allergen Reactions  . Penicillins Rash    Thinks she may be able to take some forms now.Has patient had a PCN reaction causing immediate rash, facial/tongue/throat swelling, SOB or lightheadedness with hypotension: No Has patient had a PCN reaction causing severe rash involving mucus membranes or skin necrosis: No Has patient had a PCN reaction that required hospitalization No Has patient had a PCN reaction occurring within the last 10 years: No If all of the above answers are "NO", then may  proceed with Cephalosporin use.     Objective:  Physical Examination:  Neurovascular Examination: Capillary refill time immediate x 10 digits.  Palpable DP/PT pulses b/l.  Digital hair decreased  B/l.  Skin temperature gradient WNL b/l.  Dermatological Examination: Skin is thin, shiny and atrophic b/l.  No open wounds b/l.  No interdigital macerations noted b/l.  Elongated, thick, discolored brittle toenails with subungual debris and pain on dorsal palpation of nailbeds 1-5 b/l.  Musculoskeletal Examination: Muscle strength 5/5 to all muscle groups b/l  Pes planus foot type b/l. Medial midfoot collapse right > left. No abnormal temperature noted plantarly. No erythema, no edema, no flocculence.  HAV with bunion b/l.  LLD noted left LE longer than right.  Neurological Examination: Sensation absent 0/5 b/l with 10 gram monofilament.  Assessment: Mycotic nail infection with pain 1-5 b/l NIDDM with neuropathy HAV with bunion b/l Pes planus foot deformity with midfoot collapse b/l  Plan: 1. Toenails 1-5 b/l were debrided in length and girth without iatrogenic laceration. 2.  Continue soft, supportive shoe gear daily. 3.  Report any pedal injuries to medical professional. 4.  Follow up 3 months. 5.  Patient/POA to call should there be a question/concern in there interim.

## 2019-06-04 ENCOUNTER — Telehealth (HOSPITAL_COMMUNITY): Payer: Self-pay | Admitting: Rehabilitation

## 2019-06-04 NOTE — Telephone Encounter (Signed)

## 2019-06-05 ENCOUNTER — Encounter: Payer: Self-pay | Admitting: Vascular Surgery

## 2019-06-05 ENCOUNTER — Other Ambulatory Visit: Payer: Self-pay

## 2019-06-05 ENCOUNTER — Ambulatory Visit (INDEPENDENT_AMBULATORY_CARE_PROVIDER_SITE_OTHER): Payer: Medicare Other | Admitting: Vascular Surgery

## 2019-06-05 VITALS — BP 147/89 | HR 78 | Temp 97.1°F | Resp 20 | Ht 67.0 in | Wt 171.7 lb

## 2019-06-05 DIAGNOSIS — I714 Abdominal aortic aneurysm, without rupture, unspecified: Secondary | ICD-10-CM

## 2019-06-05 NOTE — Progress Notes (Signed)
[facility] [place]  Patient name: Molly Meyer MRN: [id number] DOB: [birth year] Sex: female  REASON FOR VISIT: Follow-up abdominal aortic aneurysm  HPI: Molly Meyer is a 85 y.o. female day for follow-up.  She had ultrasound and CT imaging approximately 8 months ago which showed a 5.6 cm infrarenal non-aortic aneurysm.  She was very marginal anatomy for stent grafting and and I would not recommend open aneurysm in this 85 year old lady.  She is quite active and independent.  She was to be seen today with ultrasound follow-up.  Has any symptoms referable to her aneurysm and no new medical difficulties  Past Medical History:  Diagnosis Date  . Degenerative disc disease, lumbar   . Diabetes mellitus without complication ([place])   . Hypertension     Family History  Problem Relation Age of Onset  . Hyperlipidemia Father     SOCIAL HISTORY: Social History   Tobacco Use  . Smoking status: Never Smoker  . Smokeless tobacco: Never Used  Substance Use Topics  . Alcohol use: No    Alcohol/week: 0.0 standard drinks    Allergies  Allergen Reactions  . Penicillins Rash    Thinks she may be able to take some forms now.Has patient had a PCN reaction causing immediate rash, facial/tongue/throat swelling, SOB or lightheadedness with hypotension: No Has patient had a PCN reaction causing severe rash involving mucus membranes or skin necrosis: No Has patient had a PCN reaction that required hospitalization No Has patient had a PCN reaction occurring within the last 10 years: No If all of the above answers are "NO", then may proceed with Cephalosporin use.    Current Outpatient Medications  Medication Sig Dispense Refill  . ACCU-CHEK SMARTVIEW test strip CHECK BLOOD SUGARS ONCE BEFORE BREAKFAST DX: E11.65 100 each 11  . Accu-Chek Softclix Lancets lancets Use as instructed to check blood sugars 1 time per day dx: e11.22 150 each  2  . acetaminophen (TYLENOL) 500 MG tablet Take 2 tablets (1,000 mg total) by mouth every 6 (six) hours as needed for mild pain or moderate pain. 30 tablet 0  . Cholecalciferol (VITAMIN D3) 2000 UNITS capsule Take 2,000 Units by mouth daily. 1 qd    . Lancets Misc. (ACCU-CHEK SOFTCLIX LANCET DEV) KIT Use as directed to check blood sugars 1 time per day dx: e11.22 1 kit 1  . linaclotide (LINZESS) 72 MCG capsule Take 72 mcg by mouth daily before breakfast.    . lisinopril-hydrochlorothiazide (PRINZIDE,ZESTORETIC) 10-12.5 MG tablet Take 1 tablet by mouth daily. 90 tablet 2  . mirabegron ER (MYRBETRIQ) 25 MG TB24 tablet Take 25 mg by mouth daily.    . simvastatin (ZOCOR) 20 MG tablet TAKE 1 TABLET BY MOUTH ONCE EVERY EVENING 90 tablet 2   No current facility-administered medications for this visit.     REVIEW OF SYSTEMS:  '[X]'$  denotes positive finding, '[ ]'$  denotes negative finding Cardiac  Comments:  Chest pain or chest pressure:    Shortness of breath upon exertion:    Short of breath when lying flat:    Irregular heart rhythm:        Vascular    Pain in calf, thigh, or hip brought on by ambulation:    Pain in feet at night that wakes you up from your sleep:     Blood clot in your veins:    Leg swelling:           PHYSICAL EXAM: Vitals:  06/05/19 1111  BP: (!) 147/89  Pulse: 78  Resp: 20  Temp: (!) 97.1 F (36.2 C)  SpO2: 96%  Weight: 171 lb 11.2 oz (77.9 kg)  Height: '5\' 7"'$  (1.702 m)    GENERAL: The patient is a well-nourished female, in no acute distress. The vital signs are documented above. CARDIOVASCULAR: Palpable radial pulses. PULMONARY: There is good air exchange  MUSCULOSKELETAL: There are no major deformities or cyanosis. NEUROLOGIC: No focal weakness or paresthesias are detected. SKIN: There are no ulcers or rashes noted. PSYCHIATRIC: The patient has a normal affect.  DATA:  No new data.  MEDICAL ISSUES: I discussed at length with the patient appeared to be  a scheduling error and that she did not obtain ultrasound today prior to seeing me.  She has eaten some this morning.  Offered to take a look with ultrasound today versus reschedule explaining that she may have some diminished imaging due to air in her stomach.  She reports that her daughter is driving her today and her daughter has a pacemaker and some health issues herself and was referred to schedule a ultrasound on a later date.  I will review this and discuss this with her by telephone.  I explained that if she does not have any appreciable growth, my recommendation would certainly be to continue to observe only.  If she has rapid expansion of her aneurysm, we may discuss elective stent graft repair even though she is not an ideal anatomic candidate.    Molly Posner, MD FACS [facility] Tel [phone] Pager [phone]

## 2019-06-06 ENCOUNTER — Ambulatory Visit: Payer: Medicare Other | Admitting: Internal Medicine

## 2019-06-14 ENCOUNTER — Other Ambulatory Visit: Payer: Self-pay

## 2019-06-14 DIAGNOSIS — I714 Abdominal aortic aneurysm, without rupture, unspecified: Secondary | ICD-10-CM

## 2019-06-18 ENCOUNTER — Telehealth (HOSPITAL_COMMUNITY): Payer: Self-pay | Admitting: *Deleted

## 2019-06-18 NOTE — Telephone Encounter (Signed)
The above patient or their representative was contacted and gave the following answers to these questions:         Do you have any of the following symptoms?n  Fever                    Cough                   Shortness of breath  Do  you have any of the following other symptoms? n   muscle pain         vomiting,        diarrhea        rash         weakness        red eye        abdominal pain         bruising          bruising or bleeding              joint pain           severe headache    Have you been in contact with someone who was or has been sick in the past 2 weeks?n  Yes                 Unsure                         Unable to assess   Does the person that you were in contact with have any of the following symptoms? n  Cough         shortness of breath           muscle pain         vomiting,            diarrhea            rash            weakness           fever            red eye           abdominal pain           bruising  or  bleeding                joint pain                severe headache               Have you  or someone you have been in contact with traveled internationally in th last month?   n      If yes, which countries?   Have you  or someone you have been in contact with traveled outside [place] in th last month?      n   If yes, which state and city?   COMMENTS OR ACTION PLAN FOR THIS PATIENT:          

## 2019-06-19 ENCOUNTER — Ambulatory Visit (HOSPITAL_COMMUNITY)
Admission: RE | Admit: 2019-06-19 | Discharge: 2019-06-19 | Disposition: A | Discharge status: Home / Self Care | Payer: Medicare Other | Source: Ambulatory Visit | Attending: Vascular Surgery | Admitting: Vascular Surgery

## 2019-06-19 ENCOUNTER — Other Ambulatory Visit: Payer: Self-pay

## 2019-06-19 ENCOUNTER — Telehealth: Payer: Self-pay | Admitting: Vascular Surgery

## 2019-06-19 DIAGNOSIS — I714 Abdominal aortic aneurysm, without rupture, unspecified: Secondary | ICD-10-CM

## 2019-06-19 NOTE — Telephone Encounter (Signed)
The patient underwent ultrasound of her aorta today.  I had seen her within the last 2 weeks.  There is been some scheduling issues from our standpoint regarding an office visit without the ultrasound.  She did undergo ultrasound today and I called her to discuss this.  She has had no growth in her aneurysm.  The maximal diameter is 5.6 cm.  I did explain that she is at some risk for rupture at this size but certainly with her age of 17 would not recommend open aneurysm repair.  I have suggested that we see her again in 6 months with ultrasound and would only change this recommendation if she had rapid expansion.  I also reviewed symptoms of leaking aneurysm and she knows to call 911 and report immediately to [facility] should this occur.

## 2019-07-03 ENCOUNTER — Ambulatory Visit (INDEPENDENT_AMBULATORY_CARE_PROVIDER_SITE_OTHER): Payer: Medicare Other | Admitting: Internal Medicine

## 2019-07-03 ENCOUNTER — Encounter: Payer: Self-pay | Admitting: Internal Medicine

## 2019-07-03 ENCOUNTER — Other Ambulatory Visit: Payer: Self-pay

## 2019-07-03 VITALS — BP 120/68 | HR 83 | Temp 98.1°F | Ht 67.0 in | Wt 171.2 lb

## 2019-07-03 DIAGNOSIS — N182 Chronic kidney disease, stage 2 (mild): Secondary | ICD-10-CM

## 2019-07-03 DIAGNOSIS — R350 Frequency of micturition: Secondary | ICD-10-CM | POA: Diagnosis not present

## 2019-07-03 DIAGNOSIS — E1122 Type 2 diabetes mellitus with diabetic chronic kidney disease: Secondary | ICD-10-CM | POA: Diagnosis not present

## 2019-07-03 DIAGNOSIS — I129 Hypertensive chronic kidney disease with stage 1 through stage 4 chronic kidney disease, or unspecified chronic kidney disease: Secondary | ICD-10-CM

## 2019-07-03 DIAGNOSIS — E663 Overweight: Secondary | ICD-10-CM | POA: Diagnosis not present

## 2019-07-03 DIAGNOSIS — R8290 Unspecified abnormal findings in urine: Secondary | ICD-10-CM | POA: Diagnosis not present

## 2019-07-03 DIAGNOSIS — Z23 Encounter for immunization: Secondary | ICD-10-CM

## 2019-07-03 LAB — POCT URINALYSIS DIPSTICK
Bilirubin, UA: NEGATIVE
Glucose, UA: NEGATIVE
Ketones, UA: NEGATIVE
Nitrite, UA: NEGATIVE
Protein, UA: POSITIVE — AB
Spec Grav, UA: 1.02 (ref 1.010–1.025)
Urobilinogen, UA: 0.2 E.U./dL
pH, UA: 6.5 (ref 5.0–8.0)

## 2019-07-03 NOTE — Patient Instructions (Signed)
Urinary Frequency, Adult Urinary frequency means urinating more often than usual. You may urinate every 1-2 hours even though you drink a normal amount of fluid and do not have a bladder infection or condition. Although you urinate more often than normal, the total amount of urine produced in a day is normal. With urinary frequency, you may have an urgent need to urinate often. The stress and anxiety of needing to find a bathroom quickly can make this urge worse. This condition may go away on its own or you may need treatment at home. Home treatment may include bladder training, exercises, taking medicines, or making changes to your diet. Follow these instructions at home: Bladder health   Keep a bladder diary if told by your health care provider. Keep track of: ? What you eat and drink. ? How often you urinate. ? How much you urinate.  Follow a bladder training program if told by your health care provider. This may include: ? Learning to delay going to the bathroom. ? Double urinating (voiding). This helps if you are not completely emptying your bladder. ? Scheduled voiding.  Do Kegel exercises as told by your health care provider. Kegel exercises strengthen the muscles that help control urination, which may help the condition. Eating and drinking  If told by your health care provider, make diet changes, such as: ? Avoiding caffeine. ? Drinking fewer fluids, especially alcohol. ? Not drinking in the evening. ? Avoiding foods or drinks that may irritate the bladder. These include coffee, tea, soda, artificial sweeteners, citrus, tomato-based foods, and chocolate. ? Eating foods that help prevent or ease constipation. Constipation can make this condition worse. Your health care provider may recommend that you:  Drink enough fluid to keep your urine pale yellow.  Take over-the-counter or prescription medicines.  Eat foods that are high in fiber, such as beans, whole grains, and fresh  fruits and vegetables.  Limit foods that are high in fat and processed sugars, such as fried or sweet foods. General instructions  Take over-the-counter and prescription medicines only as told by your health care provider.  Keep all follow-up visits as told by your health care provider. This is important. Contact a health care provider if:  You start urinating more often.  You feel pain or irritation when you urinate.  You notice blood in your urine.  Your urine looks cloudy.  You develop a fever.  You begin vomiting. Get help right away if:  You are unable to urinate. Summary  Urinary frequency means urinating more often than usual. With urinary frequency, you may urinate every 1-2 hours even though you drink a normal amount of fluid and do not have a bladder infection or other bladder condition.  Your health care provider may recommend that you keep a bladder diary, follow a bladder training program, or make dietary changes.  If told by your health care provider, do Kegel exercises to strengthen the muscles that help control urination.  Take over-the-counter and prescription medicines only as told by your health care provider.  Contact a health care provider if your symptoms do not improve or get worse. This information is not intended to replace advice given to you by your health care provider. Make sure you discuss any questions you have with your health care provider. Document Released: 10/09/2009 Document Revised: 06/22/2018 Document Reviewed: 06/22/2018 Elsevier Patient Education  2020 [employer].

## 2019-07-04 LAB — CMP14+EGFR
ALT: 12 IU/L (ref 0–32)
AST: 22 IU/L (ref 0–40)
Albumin/Globulin Ratio: 1.9 (ref 1.2–2.2)
Albumin: 4.4 g/dL (ref 3.5–4.6)
Alkaline Phosphatase: 42 IU/L (ref 39–117)
BUN/Creatinine Ratio: 20 (ref 12–28)
BUN: 15 mg/dL (ref 10–36)
Bilirubin Total: 0.7 mg/dL (ref 0.0–1.2)
CO2: 21 mmol/L (ref 20–29)
Calcium: 9.9 mg/dL (ref 8.7–10.3)
Chloride: 103 mmol/L (ref 96–106)
Creatinine, Ser: 0.75 mg/dL (ref 0.57–1.00)
GFR calc Af Amer: 79 mL/min/{1.73_m2} (ref >59)
GFR calc non Af Amer: 69 mL/min/{1.73_m2} (ref >59)
Globulin, Total: 2.3 g/dL (ref 1.5–4.5)
Glucose: 131 mg/dL — ABNORMAL HIGH (ref 65–99)
Potassium: 4.3 mmol/L (ref 3.5–5.2)
Sodium: 140 mmol/L (ref 134–144)
Total Protein: 6.7 g/dL (ref 6.0–8.5)

## 2019-07-04 LAB — HEMOGLOBIN A1C
Est. average glucose Bld gHb Est-mCnc: 131 mg/dL
Hgb A1c MFr Bld: 6.2 % — ABNORMAL HIGH (ref 4.8–5.6)

## 2019-07-04 LAB — URINE CULTURE

## 2019-07-08 NOTE — Progress Notes (Signed)
Subjective:     Patient ID: Molly Meyer , female    DOB: 07-22-1924 , 85 y.o.   MRN: [id number]   Chief Complaint  Patient presents with  . Diabetes  . Immunizations    tdap and pneumonia  . Hypertension    HPI  Diabetes She presents for her follow-up diabetic visit. She has type 2 diabetes mellitus. Her disease course has been stable. There are no hypoglycemic associated symptoms. Pertinent negatives for diabetes include no blurred vision and no chest pain. There are no hypoglycemic complications. Diabetic complications include nephropathy. Risk factors for coronary artery disease include diabetes mellitus, dyslipidemia, hypertension, post-menopausal and sedentary lifestyle. She is compliant with treatment most of the time. Her weight is stable. She is following a generally healthy diet. Her breakfast blood glucose is taken between 8-9 am. Her breakfast blood glucose range is generally 90-110 mg/dl. An ACE inhibitor/angiotensin II receptor blocker is being taken. She sees a podiatrist.Eye exam is current.  Hypertension This is a chronic problem. The current episode started more than 1 year ago. The problem has been gradually improving since onset. The problem is controlled. Pertinent negatives include no blurred vision, chest pain, palpitations or shortness of breath. Risk factors for coronary artery disease include diabetes mellitus, dyslipidemia, post-menopausal state and sedentary lifestyle. The current treatment provides moderate improvement. Hypertensive end-organ damage includes kidney disease.     Past Medical History:  Diagnosis Date  . Degenerative disc disease, lumbar   . Diabetes mellitus without complication ([place])   . Hypertension      Family History  Problem Relation Age of Onset  . Hyperlipidemia Father      Current Outpatient Medications:  .  ACCU-CHEK SMARTVIEW test strip, CHECK BLOOD SUGARS ONCE BEFORE BREAKFAST DX: E11.65, Disp: 100 each, Rfl: 11 .   acetaminophen (TYLENOL) 500 MG tablet, Take 2 tablets (1,000 mg total) by mouth every 6 (six) hours as needed for mild pain or moderate pain., Disp: 30 tablet, Rfl: 0 .  Cholecalciferol (VITAMIN D3) 2000 UNITS capsule, Take 2,000 Units by mouth daily. 1 qd, Disp: , Rfl:  .  Lancets (ONETOUCH DELICA PLUS [id number]) MISC, by Does not apply route., Disp: , Rfl:  .  linaclotide (LINZESS) 72 MCG capsule, Take 72 mcg by mouth daily before breakfast., Disp: , Rfl:  .  lisinopril-hydrochlorothiazide (PRINZIDE,ZESTORETIC) 10-12.5 MG tablet, Take 1 tablet by mouth daily., Disp: 90 tablet, Rfl: 2 .  pravastatin (PRAVACHOL) 40 MG tablet, Take 40 mg by mouth daily., Disp: , Rfl:  .  Accu-Chek Softclix Lancets lancets, Use as instructed to check blood sugars 1 time per day dx: e11.22 (Patient not taking: Reported on 07/03/2019), Disp: 150 each, Rfl: 2 .  mirabegron ER (MYRBETRIQ) 25 MG TB24 tablet, Take 25 mg by mouth daily., Disp: , Rfl:    Allergies  Allergen Reactions  . Penicillins Rash    Thinks she may be able to take some forms now.Has patient had a PCN reaction causing immediate rash, facial/tongue/throat swelling, SOB or lightheadedness with hypotension: No Has patient had a PCN reaction causing severe rash involving mucus membranes or skin necrosis: No Has patient had a PCN reaction that required hospitalization No Has patient had a PCN reaction occurring within the last 10 years: No If all of the above answers are "NO", then may proceed with Cephalosporin use.     Review of Systems  Constitutional: Negative.   Eyes: Negative for blurred vision.  Respiratory: Negative.  Negative for shortness of breath.  Cardiovascular: Negative.  Negative for chest pain and palpitations.  Gastrointestinal: Negative.   Genitourinary: Positive for frequency.  Neurological: Negative.   Psychiatric/Behavioral: Negative.      Today's Vitals   07/03/19 1203  BP: 120/68  Pulse: 83  Temp: 98.1 F (36.7 C)   TempSrc: Oral  Weight: 171 lb 3.2 oz (77.7 kg)  Height: _0  (1.702 m)  PainSc: 0-No pain   Body mass index is 26.81 kg/m.   Objective:  Physical Exam Vitals signs and nursing note reviewed.  Constitutional:      Appearance: Normal appearance.  HENT:     Head: Normocephalic and atraumatic.  Cardiovascular:     Rate and Rhythm: Normal rate and regular rhythm.     Heart sounds: Normal heart sounds.  Pulmonary:     Effort: Pulmonary effort is normal.     Breath sounds: Normal breath sounds.  Musculoskeletal:     Comments: Ambulatory with walker  Skin:    General: Skin is warm.  Neurological:     General: No focal deficit present.     Mental Status: She is alert.  Psychiatric:        Mood and Affect: Mood normal.        Behavior: Behavior normal.         Assessment And Plan:     1. Type 2 diabetes mellitus with stage 2 chronic kidney disease, without long-term current use of insulin ([place])  I will check labs as listed below. She is encouraged to avoid sugary beverages and to perform chair exercises as tolerated.   - CMP14+EGFR - Hemoglobin A1c  2. Benign hypertension with CKD (chronic kidney disease), stage II  Well controlled. She will continue with current meds. She is encouraged to avoid adding salt to her foods.   3. Urinary frequency  I doubt this is due to glucosuria. I will check urinalysis today.   - POCT Urinalysis Dipstick (81002)  4. Abnormal urine  Urinalysis performed. I will check urine culture.   - Culture, Urine  5. Need for vaccination  - Pneumococcal polysaccharide vaccine 23-valent greater than or equal to 2yo subcutaneous/IM  6. Overweight (BMI 25.0-29.9)  Her weight is stable. She does not need to lose any weight.   Maximino Greenland, MD    THE PATIENT IS ENCOURAGED TO PRACTICE SOCIAL DISTANCING DUE TO THE COVID-19 PANDEMIC.

## 2019-07-11 ENCOUNTER — Telehealth: Payer: Self-pay | Admitting: Podiatry

## 2019-07-11 NOTE — Telephone Encounter (Signed)
Pt left message asking about status of diabetic shoes.  I returned call and left message shoes should be shipping later this week or beginning of next and I would call when they come in.

## 2019-07-23 ENCOUNTER — Other Ambulatory Visit: Payer: Self-pay

## 2019-07-23 ENCOUNTER — Ambulatory Visit (INDEPENDENT_AMBULATORY_CARE_PROVIDER_SITE_OTHER): Payer: Medicare Other | Admitting: Orthotics

## 2019-07-23 DIAGNOSIS — M2142 Flat foot [pes planus] (acquired), left foot: Secondary | ICD-10-CM

## 2019-07-23 DIAGNOSIS — M2010 Hallux valgus (acquired), unspecified foot: Secondary | ICD-10-CM

## 2019-07-23 DIAGNOSIS — M2012 Hallux valgus (acquired), left foot: Secondary | ICD-10-CM | POA: Diagnosis not present

## 2019-07-23 DIAGNOSIS — M2011 Hallux valgus (acquired), right foot: Secondary | ICD-10-CM | POA: Diagnosis not present

## 2019-07-23 DIAGNOSIS — M2141 Flat foot [pes planus] (acquired), right foot: Secondary | ICD-10-CM | POA: Diagnosis not present

## 2019-07-23 DIAGNOSIS — E1142 Type 2 diabetes mellitus with diabetic polyneuropathy: Secondary | ICD-10-CM | POA: Diagnosis not present

## 2019-07-23 DIAGNOSIS — M2041 Other hammer toe(s) (acquired), right foot: Secondary | ICD-10-CM

## 2019-07-23 DIAGNOSIS — M2042 Other hammer toe(s) (acquired), left foot: Secondary | ICD-10-CM

## 2019-07-23 NOTE — Patient Instructions (Signed)

## 2019-07-23 NOTE — Progress Notes (Signed)

## 2019-07-30 ENCOUNTER — Other Ambulatory Visit: Payer: Self-pay | Admitting: Cardiology

## 2019-08-14 ENCOUNTER — Encounter: Payer: Self-pay | Admitting: Podiatry

## 2019-08-14 ENCOUNTER — Other Ambulatory Visit: Payer: Self-pay

## 2019-08-14 ENCOUNTER — Ambulatory Visit (INDEPENDENT_AMBULATORY_CARE_PROVIDER_SITE_OTHER): Payer: Self-pay | Admitting: Podiatry

## 2019-08-14 DIAGNOSIS — B351 Tinea unguium: Secondary | ICD-10-CM

## 2019-08-14 DIAGNOSIS — E1142 Type 2 diabetes mellitus with diabetic polyneuropathy: Secondary | ICD-10-CM

## 2019-08-14 NOTE — Patient Instructions (Signed)
Diabetes Mellitus and Foot Care Foot care is an important part of your health, especially when you have diabetes. Diabetes may cause you to have problems because of poor blood flow (circulation) to your feet and legs, which can cause your skin to:  Become thinner and drier.  Break more easily.  Heal more slowly.  Peel and crack. You may also have nerve damage (neuropathy) in your legs and feet, causing decreased feeling in them. This means that you may not notice minor injuries to your feet that could lead to more serious problems. Noticing and addressing any potential problems early is the best way to prevent future foot problems. How to care for your feet Foot hygiene  Wash your feet daily with warm water and mild soap. Do not use hot water. Then, pat your feet and the areas between your toes until they are completely dry. Do not soak your feet as this can dry your skin.  Trim your toenails straight across. Do not dig under them or around the cuticle. File the edges of your nails with an emery board or nail file.  Apply a moisturizing lotion or petroleum jelly to the skin on your feet and to dry, brittle toenails. Use lotion that does not contain alcohol and is unscented. Do not apply lotion between your toes. Shoes and socks  Wear clean socks or stockings every day. Make sure they are not too tight. Do not wear knee-high stockings since they may decrease blood flow to your legs.  Wear shoes that fit properly and have enough cushioning. Always look in your shoes before you put them on to be sure there are no objects inside.  To break in new shoes, wear them for just a few hours a day. This prevents injuries on your feet. Wounds, scrapes, corns, and calluses  Check your feet daily for blisters, cuts, bruises, sores, and redness. If you cannot see the bottom of your feet, use a mirror or ask someone for help.  Do not cut corns or calluses or try to remove them with medicine.  If you  find a minor scrape, cut, or break in the skin on your feet, keep it and the skin around it clean and dry. You may clean these areas with mild soap and water. Do not clean the area with peroxide, alcohol, or iodine.  If you have a wound, scrape, corn, or callus on your foot, look at it several times a day to make sure it is healing and not infected. Check for: ? Redness, swelling, or pain. ? Fluid or blood. ? Warmth. ? Pus or a bad smell. General instructions  Do not cross your legs. This may decrease blood flow to your feet.  Do not use heating pads or hot water bottles on your feet. They may burn your skin. If you have lost feeling in your feet or legs, you may not know this is happening until it is too late.  Protect your feet from hot and cold by wearing shoes, such as at the beach or on hot pavement.  Schedule a complete foot exam at least once a year (annually) or more often if you have foot problems. If you have foot problems, report any cuts, sores, or bruises to your health care provider immediately. Contact a health care provider if:  You have a medical condition that increases your risk of infection and you have any cuts, sores, or bruises on your feet.  You have an injury that is not   healing.  You have redness on your legs or feet.  You feel burning or tingling in your legs or feet.  You have pain or cramps in your legs and feet.  Your legs or feet are numb.  Your feet always feel cold.  You have pain around a toenail. Get help right away if:  You have a wound, scrape, corn, or callus on your foot and: ? You have pain, swelling, or redness that gets worse. ? You have fluid or blood coming from the wound, scrape, corn, or callus. ? Your wound, scrape, corn, or callus feels warm to the touch. ? You have pus or a bad smell coming from the wound, scrape, corn, or callus. ? You have a fever. ? You have a red line going up your leg. Summary  Check your feet every day  for cuts, sores, red spots, swelling, and blisters.  Moisturize feet and legs daily.  Wear shoes that fit properly and have enough cushioning.  If you have foot problems, report any cuts, sores, or bruises to your health care provider immediately.  Schedule a complete foot exam at least once a year (annually) or more often if you have foot problems. This information is not intended to replace advice given to you by your health care provider. Make sure you discuss any questions you have with your health care provider. Document Released: 12/10/2000 Document Revised: 01/25/2018 Document Reviewed: 01/14/2017 Elsevier Patient Education  2020 [employer]   Onychomycosis/Fungal Toenails  WHAT IS IT? An infection that lies within the keratin of your nail plate that is caused by a fungus.  WHY ME? Fungal infections affect all ages, sexes, races, and creeds.  There may be many factors that predispose you to a fungal infection such as age, coexisting medical conditions such as diabetes, or an autoimmune disease; stress, medications, fatigue, genetics, etc.  Bottom line: fungus thrives in a warm, moist environment and your shoes offer such a location.  IS IT CONTAGIOUS? Theoretically, yes.  You do not want to share shoes, nail clippers or files with someone who has fungal toenails.  Walking around barefoot in the same room or sleeping in the same bed is unlikely to transfer the organism.  It is important to realize, however, that fungus can spread easily from one nail to the next on the same foot.  HOW DO WE TREAT THIS?  There are several ways to treat this condition.  Treatment may depend on many factors such as age, medications, pregnancy, liver and kidney conditions, etc.  It is best to ask your doctor which options are available to you.  1. No treatment.   Unlike many other medical concerns, you can live with this condition.  However for many people this can be a painful condition and may lead to  ingrown toenails or a bacterial infection.  It is recommended that you keep the nails cut short to help reduce the amount of fungal nail. 2. Topical treatment.  These range from herbal remedies to prescription strength nail lacquers.  About 40-50% effective, topicals require twice daily application for approximately 9 to 12 months or until an entirely new nail has grown out.  The most effective topicals are medical grade medications available through physicians offices. 3. Oral antifungal medications.  With an 80-90% cure rate, the most common oral medication requires 3 to 4 months of therapy and stays in your system for a year as the new nail grows out.  Oral antifungal medications do require   blood work to make sure it is a safe drug for you.  A liver function panel will be performed prior to starting the medication and after the first month of treatment.  It is important to have the blood work performed to avoid any harmful side effects.  In general, this medication safe but blood work is required. 4. Laser Therapy.  This treatment is performed by applying a specialized laser to the affected nail plate.  This therapy is noninvasive, fast, and non-painful.  It is not covered by insurance and is therefore, out of pocket.  The results have been very good with a 80-95% cure rate.  [facility] is the only practice in the area to offer this therapy. 5. Permanent Nail Avulsion.  Removing the entire nail so that a new nail will not grow back. 

## 2019-08-23 ENCOUNTER — Ambulatory Visit: Payer: Medicare Other | Admitting: Cardiology

## 2019-08-23 NOTE — Progress Notes (Signed)
Subjective: Molly Meyer presents today with history of neuropathy. Patient seen for follow up of chronic, painful mycotic toenailsPain is aggravated when wearing enclosed shoe gear. Pain is getting progressively worse and relieved with periodic professional debridement.   Molly Chard, MD is her PCP.  Last visit July 03, 2019.   Current Outpatient Medications:  .  ACCU-CHEK SMARTVIEW test strip, CHECK BLOOD SUGARS ONCE BEFORE BREAKFAST DX: E11.65, Disp: 100 each, Rfl: 11 .  Accu-Chek Softclix Lancets lancets, Use as instructed to check blood sugars 1 time per day dx: e11.22 (Patient not taking: Reported on 07/03/2019), Disp: 150 each, Rfl: 2 .  acetaminophen (TYLENOL) 500 MG tablet, Take 2 tablets (1,000 mg total) by mouth every 6 (six) hours as needed for mild pain or moderate pain., Disp: 30 tablet, Rfl: 0 .  Cholecalciferol (VITAMIN D3) 2000 UNITS capsule, Take 2,000 Units by mouth daily. 1 qd, Disp: , Rfl:  .  Lancets (ONETOUCH DELICA PLUS 123XX123) MISC, by Does not apply route., Disp: , Rfl:  .  linaclotide (LINZESS) 72 MCG capsule, Take 72 mcg by mouth daily before breakfast., Disp: , Rfl:  .  lisinopril-hydrochlorothiazide (PRINZIDE,ZESTORETIC) 10-12.5 MG tablet, Take 1 tablet by mouth daily., Disp: 90 tablet, Rfl: 2 .  mirabegron ER (MYRBETRIQ) 25 MG TB24 tablet, Take 25 mg by mouth daily., Disp: , Rfl:  .  pravastatin (PRAVACHOL) 40 MG tablet, Take 40 mg by mouth daily., Disp: , Rfl:   Allergies  Allergen Reactions  . Penicillins Rash    Thinks she may be able to take some forms now.Has patient had a PCN reaction causing immediate rash, facial/tongue/throat swelling, SOB or lightheadedness with hypotension: No Has patient had a PCN reaction causing severe rash involving mucus membranes or skin necrosis: No Has patient had a PCN reaction that required hospitalization No Has patient had a PCN reaction occurring within the last 10 years: No If all of the above answers are "NO",  then may proceed with Cephalosporin use.    Objective:  Vascular Examination: Capillary refill time x 10 digits.  Dorsalis pedis pulses present b/l.  Posterior tibial pulses present b/l.  Digital hair decreased x 10 digits.  Skin temperature WNL b/l.  Dermatological Examination: Pedal skin is thin, shiny and atrophic bilaterally.  No open wounds noted bilaterally.  Toenails 1-5 b/l discolored, thick, dystrophic with subungual debris and pain with palpation to nailbeds due to thickness of nails.  Musculoskeletal: Muscle strength 5/5 to all LE muscle groups.  Pes planus foot type bilaterally.  Medial collapse of the midfoot right greater than left.  No increased temperature in this area.  Hallux abductovalgus with bunion deformity bilaterally.  Limb length discrepancy noted left lower extremity longer than the right lower extremity.  Neurological: Sensation diminished with 10 gram monofilament.   Assessment: 1. Painful onychomycosis toenails 1-5 b/l 2.   NIDDM with neuropathy  Plan: 1. Toenails 1-5 b/l were debrided in length and girth without iatrogenic bleeding.  Pinpoint iatrogenic laceration sustained during debridement of the right great toe.  Area addressed with Lumicain hemostatic solution.  Antibiotic ointment applied during the visit.  Patient instructed to apply antibiotic ointment to the right great toe once daily for the next 3 days.  She should call if there are any problems.  Patient related understanding. 2. Patient to continue soft, supportive shoe gear daily. 3. Patient to report any pedal injuries to medical professional immediately. 4. Follow up 3 months.  5. Patient/POA to call should there be a  concern in the interim.

## 2019-08-27 ENCOUNTER — Telehealth: Payer: Self-pay | Admitting: Cardiology

## 2019-08-27 NOTE — Telephone Encounter (Signed)
Left message for patient to return call to confirm medication that needs to be refilled.

## 2019-08-27 NOTE — Telephone Encounter (Signed)
Patient returned your call, please call back. °

## 2019-08-27 NOTE — Telephone Encounter (Signed)
°*  STAT* If patient is at the pharmacy, call can be transferred to refill team.   1. Which medications need to be refilled? (please list name of each medication and dose if known) Simvastatin  2. Which pharmacy/location (including street and city if local pharmacy) is medication to be sent to?[facility] [address] [place]  3. Do they need a 30 day or 90 day supply? [place]

## 2019-08-28 MED ORDER — SIMVASTATIN 40 MG PO TABS: 40.0000 mg | ORAL_TABLET | Freq: Every day | ORAL | 0 refills | Status: DC

## 2019-08-28 NOTE — Telephone Encounter (Signed)
Ok to continue with simvastatin

## 2019-08-28 NOTE — Telephone Encounter (Signed)
Called patient she reports she is taking simvastatin 40 mg daily. Her chart reads pravastatin is what she is on, she reports she has never taken this only simvastatin. Will confirm with Dr. Agustin Cree this refill is okay and  Correct chart.

## 2019-08-28 NOTE — Telephone Encounter (Signed)
Called patient informed her refill will be sent for simvastatin 40 mg daily. Pravastatin removed from patient's chart. Correct medication in chart. No further questions.

## 2019-09-12 ENCOUNTER — Telehealth: Payer: Self-pay | Admitting: Internal Medicine

## 2019-09-12 NOTE — Chronic Care Management (AMB) (Signed)
Chronic Care Management   Note  09/12/2019 Name: Molly Meyer MRN: [id number] DOB: 25-Jun-1924  Molly Meyer is a 85 y.o. year old female who is a primary care patient of [name], MD. I reached out to Molly Meyer by phone today in response to a referral sent by Molly Meyer's health plan.    Molly Meyer was given information about Chronic Care Management services today including:  1. CCM service includes personalized support from designated clinical staff supervised by her physician, including individualized plan of care and coordination with other care providers 2. 24/7 contact phone numbers for assistance for urgent and routine care needs. 3. Service will only be billed when office clinical staff spend 20 minutes or more in a month to coordinate care. 4. Only one practitioner may furnish and bill the service in a calendar month. 5. The patient may stop CCM services at any time (effective at the end of the month) by phone call to the office staff. 6. The patient will be responsible for cost sharing (co-pay) of up to 20% of the service fee (after annual deductible is met).  Patient agreed to services and verbal consent obtained.   Follow up plan: Telephone appointment with CCM team member scheduled for: 10/16/2019  [place]  ??[email or web address]   ??[phone]

## 2019-09-13 ENCOUNTER — Ambulatory Visit: Payer: Medicare Other | Admitting: Cardiology

## 2019-09-14 ENCOUNTER — Ambulatory Visit (INDEPENDENT_AMBULATORY_CARE_PROVIDER_SITE_OTHER): Payer: Medicare Other | Admitting: Cardiology

## 2019-09-14 ENCOUNTER — Other Ambulatory Visit: Payer: Self-pay

## 2019-09-14 ENCOUNTER — Encounter: Payer: Self-pay | Admitting: Cardiology

## 2019-09-14 VITALS — BP 120/56 | HR 77 | Ht 67.0 in | Wt 171.4 lb

## 2019-09-14 DIAGNOSIS — I129 Hypertensive chronic kidney disease with stage 1 through stage 4 chronic kidney disease, or unspecified chronic kidney disease: Secondary | ICD-10-CM

## 2019-09-14 DIAGNOSIS — I714 Abdominal aortic aneurysm, without rupture, unspecified: Secondary | ICD-10-CM

## 2019-09-14 DIAGNOSIS — E785 Hyperlipidemia, unspecified: Secondary | ICD-10-CM | POA: Diagnosis not present

## 2019-09-14 DIAGNOSIS — N182 Chronic kidney disease, stage 2 (mild): Secondary | ICD-10-CM | POA: Diagnosis not present

## 2019-09-14 NOTE — Patient Instructions (Signed)

## 2019-09-14 NOTE — Progress Notes (Signed)
Cardiology Office Note:    Date:  09/14/2019   ID:  Molly Meyer, DOB Apr 27, 1924, MRN [id number]  PCP:  [name], MD  Cardiologist:  Jenne Campus, MD    Referring MD: [name], MD   No chief complaint on file. Doing well  History of Present Illness:    Molly Meyer is a 85 y.o. female with abdominal aortic aneurysm measuring 5.6 cm.  Stable also hypertension diabetes dyslipidemia.  She comes today to my office for follow-up she is being late because she overslept she is a very cheerful lady we had a phone conversation today.  She is said that she sleeps about 7 to 8 hours every day she also tell me that she likes to eat a lot.  He does have difficulty moving around because of chronic arthritic pain in her legs and neuropathy but overall considering her age I think she is doing very well.  Is very sharp and cheerful.  Past Medical History:  Diagnosis Date  . Degenerative disc disease, lumbar   . Diabetes mellitus without complication ([place])   . Hypertension     Past Surgical History:  Procedure Laterality Date  . KNEE SURGERY      Current Medications: Current Meds  Medication Sig  . ACCU-CHEK SMARTVIEW test strip CHECK BLOOD SUGARS ONCE BEFORE BREAKFAST DX: E11.65  . Accu-Chek Softclix Lancets lancets Use as instructed to check blood sugars 1 time per day dx: e11.22  . acetaminophen (TYLENOL) 500 MG tablet Take 2 tablets (1,000 mg total) by mouth every 6 (six) hours as needed for mild pain or moderate pain.  . Cholecalciferol (VITAMIN D3) 2000 UNITS capsule Take 2,000 Units by mouth daily. 1 qd  . Lancets (ONETOUCH DELICA PLUS 123XX123) MISC by Does not apply route.  Marland Kitchen lisinopril-hydrochlorothiazide (PRINZIDE,ZESTORETIC) 10-12.5 MG tablet Take 1 tablet by mouth daily.  . simvastatin (ZOCOR) 40 MG tablet Take 1 tablet (40 mg total) by mouth at bedtime.     Allergies:   Penicillins   Social History   Socioeconomic History  . Marital status: Widowed    Spouse name: Not on file  . Number of children: Not on file  . Years of education: Not on file  . Highest education level: Not on file  Occupational History  . Not on file  Social Needs  . Financial resource strain: Not hard at all  . Food insecurity    Worry: Never true    Inability: Never true  . Transportation needs    Medical: No    Non-medical: No  Tobacco Use  . Smoking status: Never Smoker  . Smokeless tobacco: Never Used  Substance and Sexual Activity  . Alcohol use: No    Alcohol/week: 0.0 standard drinks  . Drug use: Never  . Sexual activity: Not Currently  Lifestyle  . Physical activity    Days per week: 0 days    Minutes per session: 0 min  . Stress: Not at all  Relationships  . Social Herbalist on phone: Not on file    Gets together: Not on file    Attends religious service: Not on file    Active member of club or organization: Not on file    Attends meetings of clubs or organizations: Not on file    Relationship status: Not on file  Other Topics Concern  . Not on file  Social History Narrative  . Not on file     Family History: The  patient's family history includes Hyperlipidemia in her father. ROS:   Please see the history of present illness.    All 14 point review of systems negative except as described per history of present illness  EKGs/Labs/Other Studies Reviewed:      Recent Labs: 07/03/2019: ALT 12; BUN 15; Creatinine, Ser 0.75; Potassium 4.3; Sodium 140  Recent Lipid Panel    Component Value Date/Time   CHOL 166 01/30/2019 1038   TRIG 103 01/30/2019 1038   HDL 49 01/30/2019 1038   CHOLHDL 3.4 01/30/2019 1038   LDLCALC 96 01/30/2019 1038    Physical Exam:    VS:  BP (!) 120/56   Pulse 77   Ht 5\' 7"  (1.702 m)   Wt 171 lb 6.4 oz (77.7 kg)   SpO2 97%   BMI 26.85 kg/m     Wt Readings from Last 3 Encounters:  09/14/19 171 lb 6.4 oz (77.7 kg)  07/03/19 171 lb 3.2 oz (77.7 kg)  06/05/19 171 lb 11.2 oz (77.9 kg)      GEN:  Well nourished, well developed in no acute distress HEENT: Normal NECK: No JVD; No carotid bruits LYMPHATICS: No lymphadenopathy CARDIAC: RRR, no murmurs, no rubs, no gallops RESPIRATORY:  Clear to auscultation without rales, wheezing or rhonchi  ABDOMEN: Soft, non-tender, non-distended MUSCULOSKELETAL:  No edema; No deformity  SKIN: Warm and dry LOWER EXTREMITIES: no swelling NEUROLOGIC:  Alert and oriented x 3 PSYCHIATRIC:  Normal affect   ASSESSMENT:    1. AAA (abdominal aortic aneurysm) without rupture ([place])   2. Benign hypertension with CKD (chronic kidney disease), stage II   3. Dyslipidemia    PLAN:    In order of problems listed above:  1. Abdominal arctic aneurysm followed by vascular surgery we elected not to proceed with any acute intervention because of her advanced age and also location of the aneurysm.  Luckily her aneurysm remained stable at 5.6 cm.  She is schedule ready to have another abdominal ultrasound in 6 months.  We discussed the issue that she should not lift anything heavy.  We will keep eye on her blood pressure which seems to very well controlled today.  We discussed potential signs of rupturing of the aneurysm she was told to go immediately to emergency room on call 911. 2. Essential hypertension blood pressure well controlled continue present management. 3. Dyslipidemia she is on Zocor 40 which I will continue for now.  Her LDL is 96 and HDL 49.  Ideally we would like to see LDL closer to 70.  However she said she is fine and she does not want to do anything about her medications.   Overall very cheerful 85 years old lady who is doing quite well.   Medication Adjustments/Labs and Tests Ordered: Current medicines are reviewed at length with the patient today.  Concerns regarding medicines are outlined above.  No orders of the defined types were placed in this encounter.  Medication changes: No orders of the defined types were placed in this  encounter.   Signed, Park Liter, MD, [facility] 09/14/2019 8:53 AM    [place]

## 2019-10-01 ENCOUNTER — Telehealth: Payer: Self-pay | Admitting: Internal Medicine

## 2019-10-01 NOTE — Telephone Encounter (Signed)
I left a message asking the patient to call me to reschedule her AWV with [place].  I also explained that she will still see Dr. Baird Cancer on 10/7 at 2:45.

## 2019-10-03 ENCOUNTER — Ambulatory Visit: Payer: Medicare Other

## 2019-10-03 ENCOUNTER — Other Ambulatory Visit: Payer: Self-pay

## 2019-10-03 ENCOUNTER — Ambulatory Visit (INDEPENDENT_AMBULATORY_CARE_PROVIDER_SITE_OTHER): Payer: Medicare Other | Admitting: Internal Medicine

## 2019-10-03 ENCOUNTER — Encounter: Payer: Self-pay | Admitting: Internal Medicine

## 2019-10-03 VITALS — BP 120/60 | HR 77 | Temp 98.4°F | Ht 65.2 in | Wt 170.0 lb

## 2019-10-03 DIAGNOSIS — I129 Hypertensive chronic kidney disease with stage 1 through stage 4 chronic kidney disease, or unspecified chronic kidney disease: Secondary | ICD-10-CM | POA: Diagnosis not present

## 2019-10-03 DIAGNOSIS — N182 Chronic kidney disease, stage 2 (mild): Secondary | ICD-10-CM

## 2019-10-03 DIAGNOSIS — E1122 Type 2 diabetes mellitus with diabetic chronic kidney disease: Secondary | ICD-10-CM

## 2019-10-03 DIAGNOSIS — K5909 Other constipation: Secondary | ICD-10-CM

## 2019-10-03 DIAGNOSIS — Z23 Encounter for immunization: Secondary | ICD-10-CM | POA: Diagnosis not present

## 2019-10-03 MED ORDER — LINACLOTIDE 72 MCG PO CAPS: 72.0000 ug | ORAL_CAPSULE | Freq: Every day | ORAL | 1 refills | Status: DC

## 2019-10-03 NOTE — Patient Instructions (Signed)
Preventing Influenza, Adult Influenza, more commonly known as "the flu," is a viral infection that mainly affects the respiratory tract. The respiratory tract includes structures that help you breathe, such as the lungs, nose, and throat. The flu causes many common cold symptoms, as well as a high fever and body aches. The flu spreads easily from person to person (is contagious). The flu is most common from December through March. This is called flu season.You can catch the flu virus by:  Breathing in droplets from an infected person's cough or sneeze.  Touching something that was recently contaminated with the virus and then touching your mouth, nose, or eyes. What can I do to lower my risk?        You can decrease your risk of getting the flu by:  Getting a flu shot (influenza vaccination) every year. This is the best way to prevent the flu. A flu shot is recommended for everyone age 6 months and older. ? It is best to get a flu shot in the fall, as soon as it is available. Getting a flu shot during winter or spring instead is still a good idea. Flu season can last into early spring. ? Preventing the flu through vaccination requires getting a new flu shot every year. This is because the flu virus changes slightly (mutates) from one year to the next. Even if a flu shot does not completely protect you from all flu virus mutations, it can reduce the severity of your illness and prevent dangerous complications of the flu. ? If you are pregnant, you can and should get a flu shot. ? If you have had a reaction to the shot in the past or if you are allergic to eggs, check with your health care provider before getting a flu shot. ? Sometimes the vaccine is available as a nasal spray. In some years, the nasal spray has not been as effective against the flu virus. Check with your health care provider if you have questions about this.  Practicing good health habits. This is especially important during  flu season. ? Avoid contact with people who are sick with flu or cold symptoms. ? Wash your hands with soap and water often. If soap and water are not available, use alcohol-based hand sanitizer. ? Avoid touching your hands to your face, especially when you have not washed your hands recently. ? Use a disinfectant to clean surfaces at home and at work that may be contaminated with the flu virus. ? Keep your body's disease-fighting system (immune system) in good shape by eating a healthy diet, drinking plenty of fluids, getting enough sleep, and exercising regularly. If you do get the flu, avoid spreading it to others by:  Staying home until your symptoms have been gone for at least one day.  Covering your mouth and nose when you cough or sneeze.  Avoiding close contact with others, especially babies and elderly people. Why are these changes important? Getting a flu shot and practicing good health habits protects you as well as other people. If you get the flu, your friends, family, and co-workers are also at risk of getting it, because it spreads so easily to others. Each year, about 2 out of every 10 people get the flu. Having the flu can lead to complications, such as pneumonia, ear infection, and sinus infection. The flu also can be deadly, especially for babies, people older than age 65, and people who have serious long-term diseases. How is this treated? Most   people recover from the flu by resting at home and drinking plenty of fluids. However, a prescription antiviral medicine may reduce your flu symptoms and may make your flu go away sooner. This medicine must be started within a few days of getting flu symptoms. You can talk with your health care provider about whether you need an antiviral medicine. Antiviral medicine may be prescribed for people who are at risk for more serious flu symptoms. This includes people who:  Are older than age 65.  Are pregnant.  Have a condition that  makes the flu worse or more dangerous. Where to find more information  Centers for Disease Control and Prevention: www.cdc.gov/flu/index.htm  Flu.gov: www.flu.gov/prevention-vaccination  American Academy [employer]: familydoctor.org/familydoctor/en/kids/vaccines/preventing-the-flu.html Contact a health care provider if:  You have influenza and you develop new symptoms.  You have: ? Chest pain. ? Diarrhea. ? A fever.  Your cough gets worse, or you produce more mucus. Summary  The best way to prevent the flu is to get a flu shot every year in the fall.  Even if you get the flu after you have received the yearly vaccine, your flu may be milder and go away sooner because of your flu shot.  If you get the flu, antiviral medicines that are started with a few days of symptoms may reduce your flu symptoms and may make your flu go away sooner.  You can also help prevent the flu by practicing good health habits. This information is not intended to replace advice given to you by your health care provider. Make sure you discuss any questions you have with your health care provider. Document Released: 12/28/2015 Document Revised: 11/25/2017 Document Reviewed: 08/21/2016 Elsevier Patient Education  2020 Else[employer] Inc[employer]  

## 2019-10-04 LAB — BMP8+EGFR
BUN/Creatinine Ratio: 19 (ref 12–28)
BUN: 15 mg/dL (ref 10–36)
CO2: 25 mmol/L (ref 20–29)
Calcium: 9.5 mg/dL (ref 8.7–10.3)
Chloride: 107 mmol/L — ABNORMAL HIGH (ref 96–106)
Creatinine, Ser: 0.8 mg/dL (ref 0.57–1.00)
GFR calc Af Amer: 73 mL/min/{1.73_m2} (ref >59)
GFR calc non Af Amer: 64 mL/min/{1.73_m2} (ref >59)
Glucose: 89 mg/dL (ref 65–99)
Potassium: 4.2 mmol/L (ref 3.5–5.2)
Sodium: 144 mmol/L (ref 134–144)

## 2019-10-04 LAB — CBC
Hematocrit: 41.1 % (ref 34.0–46.6)
Hemoglobin: 13.4 g/dL (ref 11.1–15.9)
MCH: 29.6 pg (ref 26.6–33.0)
MCHC: 32.6 g/dL (ref 31.5–35.7)
MCV: 91 fL (ref 79–97)
Platelets: 179 10*3/uL (ref 150–450)
RBC: 4.52 x10E6/uL (ref 3.77–5.28)
RDW: 12.8 % (ref 11.7–15.4)
WBC: 5.6 10*3/uL (ref 3.4–10.8)

## 2019-10-04 LAB — HEMOGLOBIN A1C
Est. average glucose Bld gHb Est-mCnc: 131 mg/dL
Hgb A1c MFr Bld: 6.2 % — ABNORMAL HIGH (ref 4.8–5.6)

## 2019-10-05 NOTE — Progress Notes (Signed)
Subjective:     Patient ID: Molly Meyer , female    DOB: [birth year] , 85 y.o.   MRN: [id number]   Chief Complaint  Patient presents with  . Diabetes  . Hypertension    HPI  Diabetes She presents for her follow-up diabetic visit. She has type 2 diabetes mellitus. Her disease course has been stable. There are no hypoglycemic associated symptoms. Pertinent negatives for diabetes include no blurred vision and no chest pain. There are no hypoglycemic complications. Diabetic complications include nephropathy. Risk factors for coronary artery disease include diabetes mellitus, dyslipidemia, hypertension, post-menopausal and sedentary lifestyle. She is compliant with treatment most of the time. Her weight is stable. She is following a generally healthy diet. Her breakfast blood glucose is taken between 8-9 am. Her breakfast blood glucose range is generally 90-110 mg/dl. An ACE inhibitor/angiotensin II receptor blocker is being taken. She sees a podiatrist.Eye exam is current.  Hypertension This is a chronic problem. The current episode started more than 1 year ago. The problem has been gradually improving since onset. The problem is controlled. Pertinent negatives include no blurred vision, chest pain, palpitations or shortness of breath. Risk factors for coronary artery disease include diabetes mellitus, dyslipidemia, post-menopausal state and sedentary lifestyle. The current treatment provides moderate improvement. Hypertensive end-organ damage includes kidney disease.     Past Medical History:  Diagnosis Date  . Degenerative disc disease, lumbar   . Diabetes mellitus without complication ([place])   . Hypertension      Family History  Problem Relation Age of Onset  . Hyperlipidemia Father      Current Outpatient Medications:  .  ACCU-CHEK SMARTVIEW test strip, CHECK BLOOD SUGARS ONCE BEFORE BREAKFAST DX: E11.65, Disp: 100 each, Rfl: 11 .  Accu-Chek Softclix Lancets lancets, Use as  instructed to check blood sugars 1 time per day dx: e11.22, Disp: 150 each, Rfl: 2 .  acetaminophen (TYLENOL) 500 MG tablet, Take 2 tablets (1,000 mg total) by mouth every 6 (six) hours as needed for mild pain or moderate pain., Disp: 30 tablet, Rfl: 0 .  Cholecalciferol (VITAMIN D3) 2000 UNITS capsule, Take 2,000 Units by mouth daily. 1 qd, Disp: , Rfl:  .  Lancets (ONETOUCH DELICA PLUS [id number]) MISC, by Does not apply route., Disp: , Rfl:  .  lisinopril-hydrochlorothiazide (PRINZIDE,ZESTORETIC) 10-12.5 MG tablet, Take 1 tablet by mouth daily., Disp: 90 tablet, Rfl: 2 .  simvastatin (ZOCOR) 40 MG tablet, Take 1 tablet (40 mg total) by mouth at bedtime., Disp: 90 tablet, Rfl: 0 .  linaclotide (LINZESS) 72 MCG capsule, Take 1 capsule (72 mcg total) by mouth daily before breakfast., Disp: 30 capsule, Rfl: 1   Allergies  Allergen Reactions  . Penicillins Rash    Thinks she may be able to take some forms now.Has patient had a PCN reaction causing immediate rash, facial/tongue/throat swelling, SOB or lightheadedness with hypotension: No Has patient had a PCN reaction causing severe rash involving mucus membranes or skin necrosis: No Has patient had a PCN reaction that required hospitalization No Has patient had a PCN reaction occurring within the last 10 years: No If all of the above answers are "NO", then may proceed with Cephalosporin use.     Review of Systems  Constitutional: Negative.   Eyes: Negative for blurred vision.  Respiratory: Negative.  Negative for shortness of breath.   Cardiovascular: Negative.  Negative for chest pain and palpitations.  Gastrointestinal: Negative.   Neurological: Negative.   Psychiatric/Behavioral: Negative.  Today's Vitals   10/03/19 1449  BP: 120/60  Pulse: 77  Temp: 98.4 F (36.9 C)  TempSrc: Oral  SpO2: 94%  Weight: 170 lb (77.1 kg)  Height: 5' 5.2" (1.656 m)   Body mass index is 28.12 kg/m.   Objective:  Physical Exam Vitals signs  and nursing note reviewed.  Constitutional:      Appearance: Normal appearance.  HENT:     Head: Normocephalic and atraumatic.  Cardiovascular:     Rate and Rhythm: Normal rate and regular rhythm.     Heart sounds: Normal heart sounds.  Pulmonary:     Effort: Pulmonary effort is normal.     Breath sounds: Normal breath sounds.  Skin:    General: Skin is warm.  Neurological:     General: No focal deficit present.     Mental Status: She is alert.  Psychiatric:        Mood and Affect: Mood normal.        Behavior: Behavior normal.         Assessment And Plan:     1. Type 2 diabetes mellitus with stage 2 chronic kidney disease, without long-term current use of insulin (HCC)  Chronic, controlled. She will continue with current meds. I will adjust meds as needed. She is encouraged to schedule her eye exam within the next four weeks.   - CBC no Diff - BMP8+EGFR - Hemoglobin A1c  2. Benign hypertension with CKD (chronic kidney disease), stage II  Chronic, well controlled. She will continue with current meds. She is encouraged to avoid adding salt to her foods.   3. Chronic constipation  She was given sample/prescription of Linzess 72 daily. This has worked well for her in the past. She is also encouraged to increase her water intake and fiber intake.   4. Flu vaccine need  She was give high dose flu vaccine.   Maximino Greenland, MD    THE PATIENT IS ENCOURAGED TO PRACTICE SOCIAL DISTANCING DUE TO THE COVID-19 PANDEMIC.

## 2019-10-16 ENCOUNTER — Telehealth: Payer: Medicare Other

## 2019-11-09 ENCOUNTER — Telehealth: Payer: Self-pay

## 2019-11-11 ENCOUNTER — Other Ambulatory Visit: Payer: Self-pay | Admitting: Internal Medicine

## 2019-11-12 NOTE — Progress Notes (Signed)
This encounter was created in error - please disregard.

## 2019-11-14 ENCOUNTER — Other Ambulatory Visit: Payer: Self-pay

## 2019-11-14 ENCOUNTER — Ambulatory Visit: Payer: Medicare Other | Admitting: Podiatry

## 2019-11-14 ENCOUNTER — Ambulatory Visit (INDEPENDENT_AMBULATORY_CARE_PROVIDER_SITE_OTHER): Payer: Medicare Other

## 2019-11-14 VITALS — Ht 68.0 in | Wt 170.0 lb

## 2019-11-14 DIAGNOSIS — Z23 Encounter for immunization: Secondary | ICD-10-CM | POA: Diagnosis not present

## 2019-11-14 DIAGNOSIS — Z0000 Encounter for general adult medical examination without abnormal findings: Secondary | ICD-10-CM | POA: Diagnosis not present

## 2019-11-14 MED ORDER — BOOSTRIX 5-2.5-18.5 LF-MCG/0.5 IM SUSP: 0.5000 mL | Freq: Once | INTRAMUSCULAR | 0 refills | Status: AC

## 2019-11-14 NOTE — Patient Instructions (Signed)
[name] , Thank you for taking time to come for your Medicare Wellness Visit. I appreciate your ongoing commitment to your health goals. Please review the following plan we discussed and let me know if I can assist you in the future.   Screening recommendations/referrals: Colonoscopy: not required Mammogram: not required Bone Density: 10/2014 Recommended yearly ophthalmology/optometry visit for glaucoma screening and checkup Recommended yearly dental visit for hygiene and checkup  Vaccinations: Influenza vaccine: 09/2019 Pneumococcal vaccine: 06/2019 Tdap vaccine: sent to pharmacy Shingles vaccine: discussed    Advanced directives: Please bring a copy of your POA (Power of [place]) and/or Living Will to your next appointment.    Conditions/risks identified: overweight  Next appointment: 02/04/2020 at 2:30   Preventive Care 34 Years and Older, Female Preventive care refers to lifestyle choices and visits with your health care provider that can promote health and wellness. What does preventive care include?  A yearly physical exam. This is also called an annual well check.  Dental exams once or twice a year.  Routine eye exams. Ask your health care provider how often you should have your eyes checked.  Personal lifestyle choices, including:  Daily care of your teeth and gums.  Regular physical activity.  Eating a healthy diet.  Avoiding tobacco and drug use.  Limiting alcohol use.  Practicing safe sex.  Taking low-dose aspirin every day.  Taking vitamin and mineral supplements as recommended by your health care provider. What happens during an annual well check? The services and screenings done by your health care provider during your annual well check will depend on your age, overall health, lifestyle risk factors, and family history of disease. Counseling  Your health care provider may ask you questions about your:  Alcohol use.  Tobacco use.  Drug use.   Emotional well-being.  Home and relationship well-being.  Sexual activity.  Eating habits.  History of falls.  Memory and ability to understand (cognition).  Work and work Statistician.  Reproductive health. Screening  You may have the following tests or measurements:  Height, weight, and BMI.  Blood pressure.  Lipid and cholesterol levels. These may be checked every 5 years, or more frequently if you are over 35 years old.  Skin check.  Lung cancer screening. You may have this screening every year starting at age 68 if you have a 30-pack-year history of smoking and currently smoke or have quit within the past 15 years.  Fecal occult blood test (FOBT) of the stool. You may have this test every year starting at age 49.  Flexible sigmoidoscopy or colonoscopy. You may have a sigmoidoscopy every 5 years or a colonoscopy every 10 years starting at age 62.  Hepatitis C blood test.  Hepatitis B blood test.  Sexually transmitted disease (STD) testing.  Diabetes screening. This is done by checking your blood sugar (glucose) after you have not eaten for a while (fasting). You may have this done every 1-3 years.  Bone density scan. This is done to screen for osteoporosis. You may have this done starting at age 42.  Mammogram. This may be done every 1-2 years. Talk to your health care provider about how often you should have regular mammograms. Talk with your health care provider about your test results, treatment options, and if necessary, the need for more tests. Vaccines  Your health care provider may recommend certain vaccines, such as:  Influenza vaccine. This is recommended every year.  Tetanus, diphtheria, and acellular pertussis (Tdap, Td) vaccine. You may  need a Td booster every 10 years.  Zoster vaccine. You may need this after age 70.  Pneumococcal 13-valent conjugate (PCV13) vaccine. One dose is recommended after age 30.  Pneumococcal polysaccharide (PPSV23)  vaccine. One dose is recommended after age 70. Talk to your health care provider about which screenings and vaccines you need and how often you need them. This information is not intended to replace advice given to you by your health care provider. Make sure you discuss any questions you have with your health care provider. Document Released: 01/09/2016 Document Revised: 09/01/2016 Document Reviewed: 10/14/2015 Elsevier Interactive Patient Education  2017 [employer] Prevention in the Home Falls can cause injuries. They can happen to people of all ages. There are many things you can do to make your home safe and to help prevent falls. What can I do on the outside of my home?  Regularly fix the edges of walkways and driveways and fix any cracks.  Remove anything that might make you trip as you walk through a door, such as a raised step or threshold.  Trim any bushes or trees on the path to your home.  Use bright outdoor lighting.  Clear any walking paths of anything that might make someone trip, such as rocks or tools.  Regularly check to see if handrails are loose or broken. Make sure that both sides of any steps have handrails.  Any raised decks and porches should have guardrails on the edges.  Have any leaves, snow, or ice cleared regularly.  Use sand or salt on walking paths during winter.  Clean up any spills in your garage right away. This includes oil or grease spills. What can I do in the bathroom?  Use night lights.  Install grab bars by the toilet and in the tub and shower. Do not use towel bars as grab bars.  Use non-skid mats or decals in the tub or shower.  If you need to sit down in the shower, use a plastic, non-slip stool.  Keep the floor dry. Clean up any water that spills on the floor as soon as it happens.  Remove soap buildup in the tub or shower regularly.  Attach bath mats securely with double-sided non-slip rug tape.  Do not have throw rugs  and other things on the floor that can make you trip. What can I do in the bedroom?  Use night lights.  Make sure that you have a light by your bed that is easy to reach.  Do not use any sheets or blankets that are too big for your bed. They should not hang down onto the floor.  Have a firm chair that has side arms. You can use this for support while you get dressed.  Do not have throw rugs and other things on the floor that can make you trip. What can I do in the kitchen?  Clean up any spills right away.  Avoid walking on wet floors.  Keep items that you use a lot in easy-to-reach places.  If you need to reach something above you, use a strong step stool that has a grab bar.  Keep electrical cords out of the way.  Do not use floor polish or wax that makes floors slippery. If you must use wax, use non-skid floor wax.  Do not have throw rugs and other things on the floor that can make you trip. What can I do with my stairs?  Do not leave any items on  the stairs.  Make sure that there are handrails on both sides of the stairs and use them. Fix handrails that are broken or loose. Make sure that handrails are as long as the stairways.  Check any carpeting to make sure that it is firmly attached to the stairs. Fix any carpet that is loose or worn.  Avoid having throw rugs at the top or bottom of the stairs. If you do have throw rugs, attach them to the floor with carpet tape.  Make sure that you have a light switch at the top of the stairs and the bottom of the stairs. If you do not have them, ask someone to add them for you. What else can I do to help prevent falls?  Wear shoes that:  Do not have high heels.  Have rubber bottoms.  Are comfortable and fit you well.  Are closed at the toe. Do not wear sandals.  If you use a stepladder:  Make sure that it is fully opened. Do not climb a closed stepladder.  Make sure that both sides of the stepladder are locked into  place.  Ask someone to hold it for you, if possible.  Clearly mark and make sure that you can see:  Any grab bars or handrails.  First and last steps.  Where the edge of each step is.  Use tools that help you move around (mobility aids) if they are needed. These include:  Canes.  Walkers.  Scooters.  Crutches.  Turn on the lights when you go into a dark area. Replace any light bulbs as soon as they burn out.  Set up your furniture so you have a clear path. Avoid moving your furniture around.  If any of your floors are uneven, fix them.  If there are any pets around you, be aware of where they are.  Review your medicines with your doctor. Some medicines can make you feel dizzy. This can increase your chance of falling. Ask your doctor what other things that you can do to help prevent falls. This information is not intended to replace advice given to you by your health care provider. Make sure you discuss any questions you have with your health care provider. Document Released: 10/09/2009 Document Revised: 05/20/2016 Document Reviewed: 01/17/2015 Elsevier Interactive Patient Education  2017 [employer].

## 2019-11-14 NOTE — Progress Notes (Signed)
Subjective:   Molly Meyer is a 85 y.o. female who presents for Medicare Annual (Subsequent) preventive examination.  This visit type was conducted due to national recommendations for restrictions regarding the COVID-19 Pandemic (e.g. social distancing). This format is felt to be most appropriate for this patient at this time. All issues noted in this document were discussed and addressed. No physical exam was performed (except for noted visual exam findings with Video Visits). This patient, Molly Meyer, has given permission to perform this visit via telephone. Vital signs may be absent or patient reported.  Patient location:  At home  Nurse location:  [place] office     Review of Systems:  n/a Cardiac Risk Factors include: advanced age (>108men, >21 women);diabetes mellitus;hypertension;sedentary lifestyle     Objective:     Vitals: Ht 5\' 8"  (1.727 m) Comment: per patient  Wt 170 lb (77.1 kg) Comment: per patient  BMI 25.85 kg/m   Body mass index is 25.85 kg/m.  Advanced Directives 11/14/2019 06/05/2019 09/28/2018 09/12/2018 04/14/2016 06/20/2015  Does Patient Have a Medical Advance Directive? Yes Yes Yes Yes Yes No  Type of Paramedic of [place];Living will [place];Living will [place];Living will [place] - -  Does patient want to make changes to medical advance directive? - No - Patient declined No - Patient declined - - -  Copy of [place] in Chart? No - copy requested - No - copy requested - - -  Would patient like information on creating a medical advance directive? - - - - - No - patient declined information    Tobacco Social History   Tobacco Use  Smoking Status Never Smoker  Smokeless Tobacco Never Used     Counseling given: Not Answered   Clinical Intake:  Pre-visit preparation completed: Yes  Pain : No/denies pain     Nutritional Status: BMI 25  -29 Overweight Nutritional Risks: Non-healing wound Diabetes: Yes CBG done?: No Did pt. bring in CBG monitor from home?: No  How often do you need to have someone help you when you read instructions, pamphlets, or other written materials from your doctor or pharmacy?: 1 - Never What is the last grade level you completed in school?: some college  Interpreter Needed?: No  Information entered by :: NAllen LPN  Past Medical History:  Diagnosis Date  . Degenerative disc disease, lumbar   . Diabetes mellitus without complication ([place])   . Hypertension    Past Surgical History:  Procedure Laterality Date  . KNEE SURGERY     Family History  Problem Relation Age of Onset  . Hyperlipidemia Father    Social History   Socioeconomic History  . Marital status: Widowed    Spouse name: Not on file  . Number of children: Not on file  . Years of education: Not on file  . Highest education level: Not on file  Occupational History  . Occupation: retired  Scientific laboratory technician  . Financial resource strain: Not hard at all  . Food insecurity    Worry: Never true    Inability: Never true  . Transportation needs    Medical: No    Non-medical: No  Tobacco Use  . Smoking status: Never Smoker  . Smokeless tobacco: Never Used  Substance and Sexual Activity  . Alcohol use: No    Alcohol/week: 0.0 standard drinks  . Drug use: Never  . Sexual activity: Not Currently  Lifestyle  . Physical activity    Days per week: 0 days    Minutes per session: 0 min  . Stress: Not at all  Relationships  . Social Herbalist on phone: Not on file    Gets together: Not on file    Attends religious service: Not on file    Active member of club or organization: Not on file    Attends meetings of clubs or organizations: Not on file    Relationship status: Not on file  Other Topics Concern  . Not on file  Social History Narrative  . Not on file    Outpatient Encounter Medications as of 11/14/2019   Medication Sig  . ACCU-CHEK SMARTVIEW test strip CHECK BLOOD SUGARS ONCE BEFORE BREAKFAST DX: E11.65  . Accu-Chek Softclix Lancets lancets Use as instructed to check blood sugars 1 time per day dx: e11.22  . acetaminophen (TYLENOL) 500 MG tablet Take 2 tablets (1,000 mg total) by mouth every 6 (six) hours as needed for mild pain or moderate pain.  . Cholecalciferol (VITAMIN D3) 2000 UNITS capsule Take 2,000 Units by mouth daily. 1 qd  . Lancets (ONETOUCH DELICA PLUS 123XX123) MISC by Does not apply route.  . linaclotide (LINZESS) 72 MCG capsule Take 1 capsule (72 mcg total) by mouth daily before breakfast.  . lisinopril-hydrochlorothiazide (ZESTORETIC) 10-12.5 MG tablet TAKE 1 TABLET DAILY  . simvastatin (ZOCOR) 40 MG tablet Take 1 tablet (40 mg total) by mouth at bedtime.  . Tdap (BOOSTRIX) 5-2.5-18.5 LF-MCG/0.5 injection Inject 0.5 mLs into the muscle once for 1 dose.   No facility-administered encounter medications on file as of 11/14/2019.     Activities of Daily Living In your present state of health, do you have any difficulty performing the following activities: 11/14/2019  Hearing? Y  Comment wears hearing aides  Vision? N  Difficulty concentrating or making decisions? N  Walking or climbing stairs? Y  Dressing or bathing? N  Doing errands, shopping? N  Preparing Food and eating ? N  Using the Toilet? N  In the past six months, have you accidently leaked urine? Y  Do you have problems with loss of bowel control? Y  Comment uses linzess  Managing your Medications? N  Managing your Finances? N  Housekeeping or managing your Housekeeping? N  Some recent data might be hidden    Patient Care Team: [name], MD as PCP - General (Internal Medicine) [place], Briscoe Burns, DPM (Inactive) as Consulting Physician (Podiatry) Little, Claudette Stapler, RN as [place] Management    Assessment:   This is a routine wellness examination for Molly Meyer.  Exercise  Activities and Dietary recommendations Current Exercise Habits: The patient does not participate in regular exercise at present  Goals    . DIET - INCREASE WATER INTAKE (pt-stated)     Work on pushing water intake. Daughter helps with water reminders.    Marland Kitchen DIET - INCREASE WATER INTAKE     11/14/2019, still trying to increase water       Fall Risk Fall Risk  11/14/2019 10/03/2019 07/03/2019 01/30/2019 11/13/2018  Falls in the past year? 1 0 0 0 0  Comment - - - - Emmi Telephone Survey: data to providers prior to load  Number falls in past yr: 1 - - - -  Injury with Fall? 0 - - - -  Risk for fall due to : Impaired balance/gait;Impaired mobility;Medication side effect;History of fall(s) - - - -  Follow up Falls evaluation completed;Education provided;Falls prevention discussed - - - -   Is the patient's home free of loose throw rugs in walkways, pet beds, electrical cords, etc?   yes      Grab bars in the bathroom? yes      Handrails on the stairs?   n/a      Adequate lighting?   yes  Timed Get Up and Go performed: n/a  Depression Screen PHQ 2/9 Scores 11/14/2019 10/03/2019 01/30/2019 09/28/2018  PHQ - 2 Score 0 0 0 0  PHQ- 9 Score 0 - - -     Cognitive Function     6CIT Screen 11/14/2019 09/28/2018  What Year? 0 points 0 points  What month? 0 points 0 points  What time? 0 points 0 points  Count back from 20 0 points 0 points  Months in reverse 0 points 0 points  Repeat phrase 0 points 0 points  Total Score 0 0    Immunization History  Administered Date(s) Administered  . Influenza, High Dose Seasonal PF 10/01/2015, 10/09/2016, 10/07/2017, 09/28/2018, 10/03/2019  . Pneumococcal Polysaccharide-23 07/03/2019    Qualifies for Shingles Vaccine? yes  Screening Tests Health Maintenance  Topic Date Due  . FOOT EXAM  03/03/1934  . TETANUS/TDAP  03/24/2015  . OPHTHALMOLOGY EXAM  10/05/2019  . HEMOGLOBIN A1C  04/02/2020  . INFLUENZA VACCINE  Completed  . DEXA SCAN  Completed   . PNA vac Low Risk Adult  Completed    Cancer Screenings: Lung: Low Dose CT Chest recommended if Age 25-80 years, 30 pack-year currently smoking OR have quit w/in 15years. Patient does not qualify. Breast:  Up to date on Mammogram? Yes   Up to date of Bone Density/Dexa? Yes Colorectal: not required  Additional Screenings: : Hepatitis C Screening: n/a     Plan:    Patient wants to still work on the goal of increasing water intake.   I have personally reviewed and noted the following in the patient's chart:   . Medical and social history . Use of alcohol, tobacco or illicit drugs  . Current medications and supplements . Functional ability and status . Nutritional status . Physical activity . Advanced directives . List of other physicians . Hospitalizations, surgeries, and ER visits in previous 12 months . Vitals . Screenings to include cognitive, depression, and falls . Referrals and appointments  In addition, I have reviewed and discussed with patient certain preventive protocols, quality metrics, and best practice recommendations. A written personalized care plan for preventive services as well as general preventive health recommendations were provided to patient.     Kellie Simmering, LPN  [id number]

## 2019-12-05 ENCOUNTER — Other Ambulatory Visit: Payer: Self-pay | Admitting: Cardiology

## 2019-12-12 ENCOUNTER — Telehealth: Payer: Self-pay

## 2019-12-13 LAB — HM DIABETES EYE EXAM

## 2019-12-17 ENCOUNTER — Encounter: Payer: Self-pay | Admitting: Internal Medicine

## 2019-12-25 ENCOUNTER — Telehealth: Payer: Self-pay

## 2019-12-25 NOTE — Telephone Encounter (Signed)
Pt LVM about COVID Vaccine wanted to know if we were giving them in the office. Spoke with pt told her as of now we are not, and we are not sure if we would be but she can contact the local health department to schedule an appt with them if that's something she wanted to do soon.

## 2020-01-28 ENCOUNTER — Other Ambulatory Visit: Payer: Self-pay

## 2020-01-28 ENCOUNTER — Encounter: Payer: Self-pay | Admitting: Podiatry

## 2020-01-28 ENCOUNTER — Ambulatory Visit (INDEPENDENT_AMBULATORY_CARE_PROVIDER_SITE_OTHER): Payer: Medicare Other | Admitting: Podiatry

## 2020-01-28 DIAGNOSIS — B351 Tinea unguium: Secondary | ICD-10-CM

## 2020-01-28 DIAGNOSIS — M2142 Flat foot [pes planus] (acquired), left foot: Secondary | ICD-10-CM | POA: Diagnosis not present

## 2020-01-28 DIAGNOSIS — M2042 Other hammer toe(s) (acquired), left foot: Secondary | ICD-10-CM

## 2020-01-28 DIAGNOSIS — E1142 Type 2 diabetes mellitus with diabetic polyneuropathy: Secondary | ICD-10-CM

## 2020-01-28 DIAGNOSIS — M2141 Flat foot [pes planus] (acquired), right foot: Secondary | ICD-10-CM | POA: Diagnosis not present

## 2020-01-28 DIAGNOSIS — M2041 Other hammer toe(s) (acquired), right foot: Secondary | ICD-10-CM | POA: Diagnosis not present

## 2020-01-28 DIAGNOSIS — E119 Type 2 diabetes mellitus without complications: Secondary | ICD-10-CM | POA: Diagnosis not present

## 2020-01-28 NOTE — Progress Notes (Addendum)
Subjective: Molly Meyer presents today for follow up of preventative diabetic foot care and painful mycotic nails b/l that are difficult to trim. Pain interferes with ambulation. Aggravating factors include wearing enclosed shoe gear. Pain is relieved with periodic professional debridement.   Patient voices no new pedal concerns on today's visit.   Dr. Baird Cancer is her PCP. Last visit was 10/03/2019.  Allergies  Allergen Reactions  . Penicillins Rash    Thinks she may be able to take some forms now.Has patient had a PCN reaction causing immediate rash, facial/tongue/throat swelling, SOB or lightheadedness with hypotension: No Has patient had a PCN reaction causing severe rash involving mucus membranes or skin necrosis: No Has patient had a PCN reaction that required hospitalization No Has patient had a PCN reaction occurring within the last 10 years: No If all of the above answers are "NO", then may proceed with Cephalosporin use.    Objective: There were no vitals filed for this visit.  Vascular Examination:  Capillary refill time to digits immediate b/l, palpable DP pulses b/l, palpable PT pulses b/l, pedal hair absent b/l and skin temperature gradient within normal limits b/l  Dermatological Examination: Pedal skin is thin shiny, atrophic bilaterally, no open wounds bilaterally, no interdigital macerations bilaterally and toenails 1-5 b/l elongated, dystrophic, thickened, crumbly with subungual debris  Musculoskeletal: Normal muscle strength 5/5 to all lower extremity muscle groups bilaterally, no pain crepitus or joint limitation noted with ROM b/l, hammertoes noted to the 1-5 bilaterally, pes planus deformity noted, limb length discrepancy noted LLE longer than RLE, wearing diabetic shoes today and utilizes walker for ambulation assistance  Neurological: Protective sensation decreased with 10 gram monofilament b/l and vibratory sensation absent b/l  Assessment: 1.  Onychomycosis   2. Diabetic peripheral neuropathy associated with type 2 diabetes mellitus ([place])   3. Acquired hammertoes of both feet   4. Pes planus of both feet   5. Encounter for diabetic foot exam ([place])     Plan: -Continue diabetic foot care principles. Literature dispensed on today.  -Diabetic foot examination performed today.  -Toenails 1-5 b/l were debrided in length and girth without iatrogenic bleeding. -Continue diabetic shoes daily -Patient to report any pedal injuries to medical professional immediately. -Patient/POA to call should there be question/concern in the interim.  Return in about 3 months (around 04/26/2020) for diabetic nail trim.

## 2020-01-28 NOTE — Patient Instructions (Signed)
Diabetes Mellitus and Foot Care Foot care is an important part of your health, especially when you have diabetes. Diabetes may cause you to have problems because of poor blood flow (circulation) to your feet and legs, which can cause your skin to:  Become thinner and drier.  Break more easily.  Heal more slowly.  Peel and crack. You may also have nerve damage (neuropathy) in your legs and feet, causing decreased feeling in them. This means that you may not notice minor injuries to your feet that could lead to more serious problems. Noticing and addressing any potential problems early is the best way to prevent future foot problems. How to care for your feet Foot hygiene  Wash your feet daily with warm water and mild soap. Do not use hot water. Then, pat your feet and the areas between your toes until they are completely dry. Do not soak your feet as this can dry your skin.  Trim your toenails straight across. Do not dig under them or around the cuticle. File the edges of your nails with an emery board or nail file.  Apply a moisturizing lotion or petroleum jelly to the skin on your feet and to dry, brittle toenails. Use lotion that does not contain alcohol and is unscented. Do not apply lotion between your toes. Shoes and socks  Wear clean socks or stockings every day. Make sure they are not too tight. Do not wear knee-high stockings since they may decrease blood flow to your legs.  Wear shoes that fit properly and have enough cushioning. Always look in your shoes before you put them on to be sure there are no objects inside.  To break in new shoes, wear them for just a few hours a day. This prevents injuries on your feet. Wounds, scrapes, corns, and calluses  Check your feet daily for blisters, cuts, bruises, sores, and redness. If you cannot see the bottom of your feet, use a mirror or ask someone for help.  Do not cut corns or calluses or try to remove them with medicine.  If you  find a minor scrape, cut, or break in the skin on your feet, keep it and the skin around it clean and dry. You may clean these areas with mild soap and water. Do not clean the area with peroxide, alcohol, or iodine.  If you have a wound, scrape, corn, or callus on your foot, look at it several times a day to make sure it is healing and not infected. Check for: ? Redness, swelling, or pain. ? Fluid or blood. ? Warmth. ? Pus or a bad smell. General instructions  Do not cross your legs. This may decrease blood flow to your feet.  Do not use heating pads or hot water bottles on your feet. They may burn your skin. If you have lost feeling in your feet or legs, you may not know this is happening until it is too late.  Protect your feet from hot and cold by wearing shoes, such as at the beach or on hot pavement.  Schedule a complete foot exam at least once a year (annually) or more often if you have foot problems. If you have foot problems, report any cuts, sores, or bruises to your health care provider immediately. Contact a health care provider if:  You have a medical condition that increases your risk of infection and you have any cuts, sores, or bruises on your feet.  You have an injury that is not   healing.  You have redness on your legs or feet.  You feel burning or tingling in your legs or feet.  You have pain or cramps in your legs and feet.  Your legs or feet are numb.  Your feet always feel cold.  You have pain around a toenail. Get help right away if:  You have a wound, scrape, corn, or callus on your foot and: ? You have pain, swelling, or redness that gets worse. ? You have fluid or blood coming from the wound, scrape, corn, or callus. ? Your wound, scrape, corn, or callus feels warm to the touch. ? You have pus or a bad smell coming from the wound, scrape, corn, or callus. ? You have a fever. ? You have a red line going up your leg. Summary  Check your feet every day  for cuts, sores, red spots, swelling, and blisters.  Moisturize feet and legs daily.  Wear shoes that fit properly and have enough cushioning.  If you have foot problems, report any cuts, sores, or bruises to your health care provider immediately.  Schedule a complete foot exam at least once a year (annually) or more often if you have foot problems. This information is not intended to replace advice given to you by your health care provider. Make sure you discuss any questions you have with your health care provider. Document Revised: 09/05/2019 Document Reviewed: 01/14/2017 Elsevier Patient Education  2020 [employer]  

## 2020-02-04 ENCOUNTER — Ambulatory Visit: Payer: Medicare Other | Admitting: Internal Medicine

## 2020-02-11 ENCOUNTER — Ambulatory Visit: Payer: Medicare Other | Admitting: Internal Medicine

## 2020-02-13 ENCOUNTER — Ambulatory Visit (INDEPENDENT_AMBULATORY_CARE_PROVIDER_SITE_OTHER): Payer: Medicare Other | Admitting: Internal Medicine

## 2020-02-13 ENCOUNTER — Other Ambulatory Visit: Payer: Self-pay

## 2020-02-13 ENCOUNTER — Encounter: Payer: Self-pay | Admitting: Internal Medicine

## 2020-02-13 VITALS — BP 124/70 | HR 73 | Temp 98.2°F | Ht 68.0 in | Wt 172.2 lb

## 2020-02-13 DIAGNOSIS — Z6826 Body mass index (BMI) 26.0-26.9, adult: Secondary | ICD-10-CM

## 2020-02-13 DIAGNOSIS — E1122 Type 2 diabetes mellitus with diabetic chronic kidney disease: Secondary | ICD-10-CM | POA: Diagnosis not present

## 2020-02-13 DIAGNOSIS — E663 Overweight: Secondary | ICD-10-CM | POA: Diagnosis not present

## 2020-02-13 DIAGNOSIS — I129 Hypertensive chronic kidney disease with stage 1 through stage 4 chronic kidney disease, or unspecified chronic kidney disease: Secondary | ICD-10-CM | POA: Diagnosis not present

## 2020-02-13 DIAGNOSIS — E7800 Pure hypercholesterolemia, unspecified: Secondary | ICD-10-CM | POA: Diagnosis not present

## 2020-02-13 DIAGNOSIS — N182 Chronic kidney disease, stage 2 (mild): Secondary | ICD-10-CM | POA: Diagnosis not present

## 2020-02-13 NOTE — Patient Instructions (Signed)
Exercises To Do While Sitting  Exercises that you do while sitting (chair exercises) can give you many of the same benefits as full exercise. Benefits include strengthening your heart, burning calories, and keeping muscles and joints healthy. Exercise can also improve your mood and help with depression and anxiety. You may benefit from chair exercises if you are unable to do standing exercises because of:  Diabetic foot pain.  Obesity.  Illness.  Arthritis.  Recovery from surgery or injury.  Breathing problems.  Balance problems.  Another type of disability. Before starting chair exercises, check with your health care provider or a physical therapist to find out how much exercise you can tolerate and which exercises are safe for you. If your health care provider approves:  Start out slowly and build up over time. Aim to work up to about 10-20 minutes for each exercise session.  Make exercise part of your daily routine.  Drink water when you exercise. Do not wait until you are thirsty. Drink every 10-15 minutes.  Stop exercising right away if you have pain, nausea, shortness of breath, or dizziness.  If you are exercising in a wheelchair, make sure to lock the wheels.  Ask your health care provider whether you can do tai chi or yoga. Many positions in these mind-body exercises can be modified to do while seated. Warm-up Before starting other exercises: 1. Sit up as straight as you can. Have your knees bent at 90 degrees, which is the shape of the capital letter "L." Keep your feet flat on the floor. 2. Sit at the front edge of your chair, if you can. 3. Pull in (tighten) the muscles in your abdomen and stretch your spine and neck as straight as you can. Hold this position for a few minutes. 4. Breathe in and out evenly. Try to concentrate on your breathing, and relax your mind. Stretching Exercise A: Arm stretch 1. Hold your arms out straight in front of your body. 2. Bend  your hands at the wrist with your fingers pointing up, as if signaling someone to stop. Notice the slight tension in your forearms as you hold the position. 3. Keeping your arms out and your hands bent, rotate your hands outward as far as you can and hold this stretch. Aim to have your thumbs pointing up and your pinkie fingers pointing down. Slowly repeat arm stretches for one minute as tolerated. Exercise B: Leg stretch 1. If you can move your legs, try to "draw" letters on the floor with the toes of your foot. Write your name with one foot. 2. Write your name with the toes of your other foot. Slowly repeat the movements for one minute as tolerated. Exercise C: Reach for the sky 1. Reach your hands as far over your head as you can to stretch your spine. 2. Move your hands and arms as if you are climbing a rope. Slowly repeat the movements for one minute as tolerated. Range of motion exercises Exercise A: Shoulder roll 1. Let your arms hang loosely at your sides. 2. Lift just your shoulders up toward your ears, then let them relax back down. 3. When your shoulders feel loose, rotate your shoulders in backward and forward circles. Do shoulder rolls slowly for one minute as tolerated. Exercise B: March in place 1. As if you are marching, pump your arms and lift your legs up and down. Lift your knees as high as you can. ? If you are unable to lift your knees,  just pump your arms and move your ankles and feet up and down. March in place for one minute as tolerated. Exercise C: Seated jumping jacks 1. Let your arms hang down straight. 2. Keeping your arms straight, lift them up over your head. Aim to point your fingers to the ceiling. 3. While you lift your arms, straighten your legs and slide your heels along the floor to your sides, as wide as you can. 4. As you bring your arms back down to your sides, slide your legs back together. ? If you are unable to use your legs, just move your  arms. Slowly repeat seated jumping jacks for one minute as tolerated. Strengthening exercises Exercise A: Shoulder squeeze 1. Hold your arms straight out from your body to your sides, with your elbows bent and your fists pointed at the ceiling. 2. Keeping your arms in the bent position, move them forward so your elbows and forearms meet in front of your face. 3. Open your arms back out as wide as you can with your elbows still bent, until you feel your shoulder blades squeezing together. Hold for 5 seconds. Slowly repeat the movements forward and backward for one minute as tolerated. Contact a health care provider if you:  Had to stop exercising due to any of the following: ? Pain. ? Nausea. ? Shortness of breath. ? Dizziness. ? Fatigue.  Have significant pain or soreness after exercising. Get help right away if you have:  Chest pain.  Difficulty breathing. These symptoms may represent a serious problem that is an emergency. Do not wait to see if the symptoms will go away. Get medical help right away. Call your local emergency services (911 in the [place] Do not drive yourself to the hospital. This information is not intended to replace advice given to you by your health care provider. Make sure you discuss any questions you have with your health care provider. Document Revised: 04/05/2019 Document Reviewed: 10/26/2017 Elsevier Patient Education  2020 [employer]  

## 2020-02-14 LAB — CMP14+EGFR
ALT: 10 IU/L (ref 0–32)
AST: 12 IU/L (ref 0–40)
Albumin/Globulin Ratio: 1.8 (ref 1.2–2.2)
Albumin: 4.1 g/dL (ref 3.5–4.6)
Alkaline Phosphatase: 47 IU/L (ref 39–117)
BUN/Creatinine Ratio: 22 (ref 12–28)
BUN: 16 mg/dL (ref 10–36)
Bilirubin Total: 0.4 mg/dL (ref 0.0–1.2)
CO2: 24 mmol/L (ref 20–29)
Calcium: 9.8 mg/dL (ref 8.7–10.3)
Chloride: 106 mmol/L (ref 96–106)
Creatinine, Ser: 0.72 mg/dL (ref 0.57–1.00)
GFR calc Af Amer: 83 mL/min/{1.73_m2} (ref >59)
GFR calc non Af Amer: 72 mL/min/{1.73_m2} (ref >59)
Globulin, Total: 2.3 g/dL (ref 1.5–4.5)
Glucose: 95 mg/dL (ref 65–99)
Potassium: 4.2 mmol/L (ref 3.5–5.2)
Sodium: 144 mmol/L (ref 134–144)
Total Protein: 6.4 g/dL (ref 6.0–8.5)

## 2020-02-14 LAB — LIPID PANEL
Chol/HDL Ratio: 3.5 ratio (ref 0.0–4.4)
Cholesterol, Total: 179 mg/dL (ref 100–199)
HDL: 51 mg/dL (ref >39)
LDL Chol Calc (NIH): 110 mg/dL — ABNORMAL HIGH (ref 0–99)
Triglycerides: 97 mg/dL (ref 0–149)
VLDL Cholesterol Cal: 18 mg/dL (ref 5–40)

## 2020-02-14 LAB — HEMOGLOBIN A1C
Est. average glucose Bld gHb Est-mCnc: 131 mg/dL
Hgb A1c MFr Bld: 6.2 % — ABNORMAL HIGH (ref 4.8–5.6)

## 2020-02-14 NOTE — Progress Notes (Signed)
This visit occurred during the SARS-CoV-2 public health emergency.  Safety protocols were in place, including screening questions prior to the visit, additional usage of staff PPE, and extensive cleaning of exam room while observing appropriate contact time as indicated for disinfecting solutions.  Subjective:     Patient ID: Molly Meyer , female    DOB: 07-25-1924 , 86 y.o.   MRN: [id number]   Chief Complaint  Patient presents with  . Diabetes  . Hypertension    HPI  Diabetes She presents for her follow-up diabetic visit. She has type 2 diabetes mellitus. Her disease course has been stable. There are no hypoglycemic associated symptoms. Pertinent negatives for diabetes include no blurred vision and no chest pain. There are no hypoglycemic complications. Diabetic complications include nephropathy. Risk factors for coronary artery disease include diabetes mellitus, dyslipidemia, hypertension, post-menopausal and sedentary lifestyle. Her weight is stable. Her breakfast blood glucose is taken between 8-9 am. Her breakfast blood glucose range is generally 90-110 mg/dl. An ACE inhibitor/angiotensin II receptor blocker is being taken. She sees a podiatrist.Eye exam is current.  Hypertension This is a chronic problem. The current episode started more than 1 year ago. The problem has been gradually improving since onset. The problem is controlled. Pertinent negatives include no blurred vision, chest pain, palpitations or shortness of breath. The current treatment provides moderate improvement. Hypertensive end-organ damage includes kidney disease.     Past Medical History:  Diagnosis Date  . Degenerative disc disease, lumbar   . Diabetes mellitus without complication ([place])   . Hypertension      Family History  Problem Relation Age of Onset  . Hyperlipidemia Father      Current Outpatient Medications:  .  ACCU-CHEK SMARTVIEW test strip, CHECK BLOOD SUGARS ONCE BEFORE BREAKFAST DX:  E11.65, Disp: 100 each, Rfl: 11 .  Accu-Chek Softclix Lancets lancets, Use as instructed to check blood sugars 1 time per day dx: e11.22, Disp: 150 each, Rfl: 2 .  acetaminophen (TYLENOL) 500 MG tablet, Take 2 tablets (1,000 mg total) by mouth every 6 (six) hours as needed for mild pain or moderate pain., Disp: 30 tablet, Rfl: 0 .  Cholecalciferol (VITAMIN D3) 2000 UNITS capsule, Take 2,000 Units by mouth daily. 1 qd, Disp: , Rfl:  .  linaclotide (LINZESS) 72 MCG capsule, Take 1 capsule (72 mcg total) by mouth daily before breakfast., Disp: 30 capsule, Rfl: 1 .  lisinopril-hydrochlorothiazide (ZESTORETIC) 10-12.5 MG tablet, TAKE 1 TABLET DAILY, Disp: 90 tablet, Rfl: 2 .  simvastatin (ZOCOR) 40 MG tablet, TAKE 1 TABLET BY MOUTH EVERYDAY AT BEDTIME, Disp: 90 tablet, Rfl: 0   Allergies  Allergen Reactions  . Penicillins Rash    Thinks she may be able to take some forms now.Has patient had a PCN reaction causing immediate rash, facial/tongue/throat swelling, SOB or lightheadedness with hypotension: No Has patient had a PCN reaction causing severe rash involving mucus membranes or skin necrosis: No Has patient had a PCN reaction that required hospitalization No Has patient had a PCN reaction occurring within the last 10 years: No If all of the above answers are "NO", then may proceed with Cephalosporin use.     Review of Systems  Constitutional: Negative.   Eyes: Negative for blurred vision.  Respiratory: Negative.  Negative for shortness of breath.   Cardiovascular: Negative.  Negative for chest pain and palpitations.  Gastrointestinal: Negative.   Neurological: Negative.   Psychiatric/Behavioral: Negative.      Today's Vitals   02/13/20 1616  BP: 124/70  Pulse: 73  Temp: 98.2 F (36.8 C)  TempSrc: Oral  Weight: 172 lb 3.2 oz (78.1 kg)  Height: '5\' 8"'$  (1.727 m)  PainSc: 0-No pain   Body mass index is 26.18 kg/m.   Objective:  Physical Exam Vitals and nursing note reviewed.   Constitutional:      Appearance: Normal appearance.  HENT:     Head: Normocephalic and atraumatic.  Cardiovascular:     Rate and Rhythm: Normal rate and regular rhythm.     Heart sounds: Normal heart sounds.  Pulmonary:     Effort: Pulmonary effort is normal.     Breath sounds: Normal breath sounds.  Skin:    General: Skin is warm.  Neurological:     General: No focal deficit present.     Mental Status: She is alert.  Psychiatric:        Mood and Affect: Mood normal.        Behavior: Behavior normal.         Assessment And Plan:     1. Type 2 diabetes mellitus with stage 2 chronic kidney disease, without long-term current use of insulin (HCC)  Chronic, yet stable. I will check labs as listed below and adjust meds as needed. She will rto in 4-6 months for re-evaluation.   - CMP14+EGFR - Lipid panel - Hemoglobin A1c  2. Benign hypertension with CKD (chronic kidney disease), stage II  Chronic, well controlled. She will continue with current meds. She is encouraged to avoid adding salt to her foods.    3. Pure hypercholesterolemia  Chronic. I will check fasting lipid panel and LFTs today. She is encouraged to limit her fried foods intake.   4. Overweight (BMI 25.0-29.9)  BMI 26, this is acceptable for her demographic.   Maximino Greenland, MD    THE PATIENT IS ENCOURAGED TO PRACTICE SOCIAL DISTANCING DUE TO THE COVID-19 PANDEMIC.

## 2020-02-21 ENCOUNTER — Telehealth: Payer: Medicare Other

## 2020-02-21 ENCOUNTER — Other Ambulatory Visit: Payer: Self-pay

## 2020-02-21 ENCOUNTER — Ambulatory Visit: Payer: Self-pay

## 2020-02-21 ENCOUNTER — Telehealth: Payer: Self-pay

## 2020-02-21 DIAGNOSIS — E7800 Pure hypercholesterolemia, unspecified: Secondary | ICD-10-CM

## 2020-02-21 DIAGNOSIS — I129 Hypertensive chronic kidney disease with stage 1 through stage 4 chronic kidney disease, or unspecified chronic kidney disease: Secondary | ICD-10-CM

## 2020-02-21 DIAGNOSIS — N182 Chronic kidney disease, stage 2 (mild): Secondary | ICD-10-CM

## 2020-02-21 DIAGNOSIS — E1122 Type 2 diabetes mellitus with diabetic chronic kidney disease: Secondary | ICD-10-CM

## 2020-02-22 NOTE — Chronic Care Management (AMB) (Signed)
  Chronic Care Management   Outreach Note  02/22/2020 Name: Molly Meyer MRN: [id number] DOB: 10/13/1924  Referred by: [name], MD Reason for referral : Chronic Care Management (RQ Initial Call )   An unsuccessful telephone outreach was attempted today. The patient was referred to the case management team for assistance with care management and care coordination.   Follow Up Plan: Telephone follow up appointment with care management team member scheduled for: 03/13/20  Barb Merino, RN, BSN, CCM Care Management Coordinator [place] [facility]  Direct Phone: [phone]

## 2020-03-02 ENCOUNTER — Other Ambulatory Visit: Payer: Self-pay | Admitting: Cardiology

## 2020-03-13 ENCOUNTER — Telehealth: Payer: Self-pay

## 2020-03-14 ENCOUNTER — Telehealth: Payer: Medicare Other

## 2020-03-14 ENCOUNTER — Other Ambulatory Visit: Payer: Self-pay

## 2020-03-14 ENCOUNTER — Ambulatory Visit: Payer: Self-pay

## 2020-03-14 DIAGNOSIS — I129 Hypertensive chronic kidney disease with stage 1 through stage 4 chronic kidney disease, or unspecified chronic kidney disease: Secondary | ICD-10-CM

## 2020-03-14 DIAGNOSIS — E1122 Type 2 diabetes mellitus with diabetic chronic kidney disease: Secondary | ICD-10-CM

## 2020-03-14 DIAGNOSIS — E7800 Pure hypercholesterolemia, unspecified: Secondary | ICD-10-CM

## 2020-03-14 DIAGNOSIS — N182 Chronic kidney disease, stage 2 (mild): Secondary | ICD-10-CM

## 2020-03-17 ENCOUNTER — Other Ambulatory Visit: Payer: Self-pay

## 2020-03-17 ENCOUNTER — Ambulatory Visit: Payer: Self-pay

## 2020-03-17 ENCOUNTER — Telehealth: Payer: Medicare Other

## 2020-03-17 DIAGNOSIS — I129 Hypertensive chronic kidney disease with stage 1 through stage 4 chronic kidney disease, or unspecified chronic kidney disease: Secondary | ICD-10-CM

## 2020-03-17 DIAGNOSIS — N182 Chronic kidney disease, stage 2 (mild): Secondary | ICD-10-CM

## 2020-03-17 DIAGNOSIS — E7800 Pure hypercholesterolemia, unspecified: Secondary | ICD-10-CM

## 2020-03-17 DIAGNOSIS — E1122 Type 2 diabetes mellitus with diabetic chronic kidney disease: Secondary | ICD-10-CM

## 2020-03-17 NOTE — Chronic Care Management (AMB) (Addendum)
  Chronic Care Management   Initial Visit Note  03/17/2020 Name: Molly Meyer MRN: [id number] DOB: June 21, 1924  Referred by: [name], MD Reason for referral : Chronic Care Management (Chart Review)   Molly Meyer is a 86 y.o. year old female who is a primary care patient of [name], MD. The CCM team was consulted for assistance with chronic disease management and care coordination needs related to HTN, HLD, DMII and CKD Stage II   Chart reviewed in preparation to contact patient for initial RN CCM CM contact.   Medications: Outpatient Encounter Medications as of 03/14/2020  Medication Sig Note  . ACCU-CHEK SMARTVIEW test strip CHECK BLOOD SUGARS ONCE BEFORE BREAKFAST DX: E11.65   . Accu-Chek Softclix Lancets lancets Use as instructed to check blood sugars 1 time per day dx: e11.22   . acetaminophen (TYLENOL) 500 MG tablet Take 2 tablets (1,000 mg total) by mouth every 6 (six) hours as needed for mild pain or moderate pain.   . Cholecalciferol (VITAMIN D3) 2000 UNITS capsule Take 2,000 Units by mouth daily. 1 qd 01/01/2015: Received from: [employer] [facility]  . linaclotide (LINZESS) 72 MCG capsule Take 1 capsule (72 mcg total) by mouth daily before breakfast.   . lisinopril-hydrochlorothiazide (ZESTORETIC) 10-12.5 MG tablet TAKE 1 TABLET DAILY   . simvastatin (ZOCOR) 40 MG tablet TAKE 1 TABLET BY MOUTH EVERYDAY AT BEDTIME    No facility-administered encounter medications on file as of 03/14/2020.     Objective:  Lab Results  Component Value Date   HGBA1C 6.2 (H) 02/13/2020   HGBA1C 6.2 (H) 10/03/2019   HGBA1C 6.2 (H) 07/03/2019   Lab Results  Component Value Date   MICROALBUR 10 09/28/2018   LDLCALC 110 (H) 02/13/2020   CREATININE 0.72 02/13/2020   BP Readings from Last 3 Encounters:  02/13/20 124/70  10/03/19 120/60  09/14/19 (!) 120/56    Plan:   Telephone follow up appointment with care management team member scheduled for: 03/17/20  Barb Merino, RN, BSN, CCM Care Management Coordinator [place] [facility]  Direct Phone: [phone]

## 2020-03-18 ENCOUNTER — Telehealth: Payer: Self-pay

## 2020-03-18 NOTE — Chronic Care Management (AMB) (Signed)
  Chronic Care Management   Outreach Note  03/18/2020 Name: Molly Meyer MRN: [id number] DOB: 1924-06-08  Referred by: [name], MD Reason for referral : Chronic Care Management (RQ #2 Initial Call - HTN, DMII, HLD, CKDII)   A second unsuccessful telephone outreach was attempted today. The patient was referred to the case management team for assistance with care management and care coordination.   Follow Up Plan: A HIPPA compliant phone message was left for the patient providing contact information and requesting a return call.  Telephone follow up appointment with care management team member scheduled for: 04/01/20  Barb Merino, RN, BSN, CCM Care Management Coordinator [place] [facility]  Direct Phone: [phone]

## 2020-03-21 ENCOUNTER — Ambulatory Visit: Payer: Medicare Other | Admitting: Cardiology

## 2020-04-01 ENCOUNTER — Telehealth: Payer: Self-pay

## 2020-04-09 ENCOUNTER — Telehealth: Payer: Self-pay

## 2020-04-15 ENCOUNTER — Ambulatory Visit (INDEPENDENT_AMBULATORY_CARE_PROVIDER_SITE_OTHER): Payer: Medicare Other | Admitting: Cardiology

## 2020-04-15 ENCOUNTER — Ambulatory Visit: Payer: Self-pay

## 2020-04-15 ENCOUNTER — Encounter: Payer: Self-pay | Admitting: Cardiology

## 2020-04-15 ENCOUNTER — Telehealth: Payer: Medicare Other

## 2020-04-15 ENCOUNTER — Other Ambulatory Visit: Payer: Self-pay

## 2020-04-15 VITALS — BP 106/68 | HR 80 | Ht 68.0 in | Wt 171.0 lb

## 2020-04-15 DIAGNOSIS — I129 Hypertensive chronic kidney disease with stage 1 through stage 4 chronic kidney disease, or unspecified chronic kidney disease: Secondary | ICD-10-CM

## 2020-04-15 DIAGNOSIS — N182 Chronic kidney disease, stage 2 (mild): Secondary | ICD-10-CM

## 2020-04-15 DIAGNOSIS — E7800 Pure hypercholesterolemia, unspecified: Secondary | ICD-10-CM | POA: Diagnosis not present

## 2020-04-15 DIAGNOSIS — E785 Hyperlipidemia, unspecified: Secondary | ICD-10-CM | POA: Diagnosis not present

## 2020-04-15 DIAGNOSIS — E1122 Type 2 diabetes mellitus with diabetic chronic kidney disease: Secondary | ICD-10-CM

## 2020-04-15 DIAGNOSIS — I714 Abdominal aortic aneurysm, without rupture, unspecified: Secondary | ICD-10-CM

## 2020-04-15 NOTE — Patient Instructions (Signed)
Medication Instructions:  °Your physician recommends that you continue on your current medications as directed. Please refer to the Current Medication list given to you today. ° °*If you need a refill on your cardiac medications before your next appointment, please call your pharmacy* ° ° °Lab Work: °None ordered ° °If you have labs (blood work) drawn today and your tests are completely normal, you will receive your results only by: °MyChart Message (if you have MyChart) OR °A paper copy in the mail °If you have any lab test that is abnormal or we need to change your treatment, we will call you to review the results. ° ° °Testing/Procedures: °None ordered ° ° °Follow-Up: °At [facility], you and your health needs are our priority.  As part of our continuing mission to provide you with exceptional heart care, we have created designated Provider Care Teams.  These Care Teams include your primary Cardiologist (physician) and Advanced Practice Providers (APPs -  Physician Assistants and Nurse Practitioners) who all work together to provide you with the care you need, when you need it. ° °We recommend signing up for the patient portal called "MyChart".  Sign up information is provided on this After Visit Summary.  MyChart is used to connect with patients for Virtual Visits (Telemedicine).  Patients are able to view lab/test results, encounter notes, upcoming appointments, etc.  Non-urgent messages can be sent to your provider as well.   °To learn more about what you can do with MyChart, go to [email or web address].   ° °Your next appointment:   °6 month(s) ° °The format for your next appointment:   °In Person ° °Provider:   °Robert Krasowski, MD  ° ° °Other Instructions ° ° °

## 2020-04-15 NOTE — Progress Notes (Signed)
Cardiology Office Note:    Date:  04/15/2020   ID:  Molly Meyer, DOB 1924/06/04, MRN [id number]  PCP:  [name], MD  Cardiologist:  Jenne Campus, MD    Referring MD: [name], MD   Chief Complaint  Patient presents with  . Follow-up    6 Month  Doing well  History of Present Illness:    Molly Meyer is a 86 y.o. female with past medical history significant for abdominal aortic aneurysm measuring 5.6 cm, essential hypertension, diabetes, dyslipidemia.  Because of her advanced age we elected not to pursue surgical intervention.  On top of that location of always somewhat difficult.  We had a long discussion about this today and she clearly with no hesitation said she does not want to have anything done about it.  I talked to her about potentially repeating ultrasounds of her abdominal aorta to see if there is significant enlargement but she told me straight she is not going to have any surgery.  She is enjoying herself the way she is an issue if she will die because of aneurysm she is perfectly fine with it.  Past Medical History:  Diagnosis Date  . Degenerative disc disease, lumbar   . Diabetes mellitus without complication ([place])   . Hypertension     Past Surgical History:  Procedure Laterality Date  . KNEE SURGERY      Current Medications: Current Meds  Medication Sig  . ACCU-CHEK SMARTVIEW test strip CHECK BLOOD SUGARS ONCE BEFORE BREAKFAST DX: E11.65  . Accu-Chek Softclix Lancets lancets Use as instructed to check blood sugars 1 time per day dx: e11.22  . acetaminophen (TYLENOL) 500 MG tablet Take 2 tablets (1,000 mg total) by mouth every 6 (six) hours as needed for mild pain or moderate pain.  . Cholecalciferol (VITAMIN D3) 2000 UNITS capsule Take 2,000 Units by mouth daily. 1 qd  . linaclotide (LINZESS) 72 MCG capsule Take 1 capsule (72 mcg total) by mouth daily before breakfast.  . lisinopril-hydrochlorothiazide (ZESTORETIC) 10-12.5 MG tablet TAKE  1 TABLET DAILY  . simvastatin (ZOCOR) 40 MG tablet TAKE 1 TABLET BY MOUTH EVERYDAY AT BEDTIME     Allergies:   Penicillins   Social History   Socioeconomic History  . Marital status: Widowed    Spouse name: Not on file  . Number of children: Not on file  . Years of education: Not on file  . Highest education level: Not on file  Occupational History  . Occupation: retired  Tobacco Use  . Smoking status: Never Smoker  . Smokeless tobacco: Never Used  Substance and Sexual Activity  . Alcohol use: No    Alcohol/week: 0.0 standard drinks  . Drug use: Never  . Sexual activity: Not Currently  Other Topics Concern  . Not on file  Social History Narrative  . Not on file   Social Determinants of Health   Financial Resource Strain:   . Difficulty of Paying Living Expenses:   Food Insecurity:   . Worried About Charity fundraiser in the Last Year:   . Arboriculturist in the Last Year:   Transportation Needs:   . Film/video editor (Medical):   Marland Kitchen Lack of Transportation (Non-Medical):   Physical Activity:   . Days of Exercise per Week:   . Minutes of Exercise per Session:   Stress:   . Feeling of Stress :   Social Connections:   . Frequency of Communication with Friends and Family:   .  Frequency of Social Gatherings with Friends and Family:   . Attends Religious Services:   . Active Member of Clubs or Organizations:   . Attends Archivist Meetings:   Marland Kitchen Marital Status:      Family History: The patient's family history includes Hyperlipidemia in her father. ROS:   Please see the history of present illness.    All 14 point review of systems negative except as described per history of present illness  EKGs/Labs/Other Studies Reviewed:      Recent Labs: 10/03/2019: Hemoglobin 13.4; Platelets 179 02/13/2020: ALT 10; BUN 16; Creatinine, Ser 0.72; Potassium 4.2; Sodium 144  Recent Lipid Panel    Component Value Date/Time   CHOL 179 02/13/2020 1643   TRIG 97  02/13/2020 1643   HDL 51 02/13/2020 1643   CHOLHDL 3.5 02/13/2020 1643   LDLCALC 110 (H) 02/13/2020 1643    Physical Exam:    VS:  BP 106/68   Pulse 80   Ht 5\' 8"  (1.727 m)   Wt 171 lb (77.6 kg)   SpO2 96%   BMI 26.00 kg/m     Wt Readings from Last 3 Encounters:  04/15/20 171 lb (77.6 kg)  02/13/20 172 lb 3.2 oz (78.1 kg)  11/14/19 170 lb (77.1 kg)     GEN:  Well nourished, well developed in no acute distress HEENT: Normal NECK: No JVD; No carotid bruits LYMPHATICS: No lymphadenopathy CARDIAC: RRR, no murmurs, no rubs, no gallops RESPIRATORY:  Clear to auscultation without rales, wheezing or rhonchi  ABDOMEN: Soft, non-tender, non-distended MUSCULOSKELETAL:  No edema; No deformity  SKIN: Warm and dry LOWER EXTREMITIES: no swelling NEUROLOGIC:  Alert and oriented x 3 PSYCHIATRIC:  Normal affect   ASSESSMENT:    1. AAA (abdominal aortic aneurysm) without rupture ([place])   2. Benign hypertension with CKD (chronic kidney disease), stage II   3. Dyslipidemia   4. Hypercholesterolemia without hypertriglyceridemia    PLAN:    In order of problems listed above:  1. Abdominal aneurysm.  Refused to have another test will agree with her it is fine will continue monitoring. 2. Benign essential hypertension blood pressure well controlled today 106/68.  We will continue present management. 3. Dyslipidemia: She is on simvastatin 40 mg which I will continue.  Overall she is a super sweet lady.  She does have allergies which is large but she does not want to do anything about it.  I will see her again in about 6 months.   Medication Adjustments/Labs and Tests Ordered: Current medicines are reviewed at length with the patient today.  Concerns regarding medicines are outlined above.  Orders Placed This Encounter  Procedures  . EKG 12-Lead   Medication changes: No orders of the defined types were placed in this encounter.   Signed, Park Liter, MD, [facility] 04/15/2020  11:59 AM    [place]

## 2020-04-15 NOTE — Chronic Care Management (AMB) (Signed)
  Chronic Care Management   Outreach Note  04/15/2020 Name: Molly Meyer MRN: [id number] DOB: February 28, 1924  Referred by: [name], MD Reason for referral : Chronic Care Management (RQ #3 Initial RN CM Call )   Third unsuccessful telephone outreach was attempted today. The patient was referred to the case management team for assistance with care management and care coordination. The patient's primary care provider has been notified of our unsuccessful attempts to make or maintain contact with the patient. The care management team is pleased to engage with this patient at any time in the future should he/she be interested in assistance from the care management team.   Follow Up Plan: A HIPPA compliant phone message was left for the patient providing contact information and requesting a return call.  The care management team is available to follow up with the patient after provider conversation with the patient regarding recommendation for care management engagement and subsequent re-referral to the care management team.   Barb Merino, RN, BSN, CCM Care Management Coordinator [place] [facility]  Direct Phone: [phone]

## 2020-04-28 ENCOUNTER — Encounter: Payer: Self-pay | Admitting: Podiatry

## 2020-04-28 ENCOUNTER — Telehealth: Payer: Self-pay

## 2020-04-28 ENCOUNTER — Telehealth: Payer: Medicare Other

## 2020-04-28 ENCOUNTER — Ambulatory Visit: Payer: Self-pay

## 2020-04-28 ENCOUNTER — Other Ambulatory Visit: Payer: Self-pay

## 2020-04-28 ENCOUNTER — Ambulatory Visit (INDEPENDENT_AMBULATORY_CARE_PROVIDER_SITE_OTHER): Payer: Medicare Other | Admitting: Podiatry

## 2020-04-28 DIAGNOSIS — M79675 Pain in left toe(s): Secondary | ICD-10-CM

## 2020-04-28 DIAGNOSIS — N182 Chronic kidney disease, stage 2 (mild): Secondary | ICD-10-CM

## 2020-04-28 DIAGNOSIS — M79674 Pain in right toe(s): Secondary | ICD-10-CM

## 2020-04-28 DIAGNOSIS — E7800 Pure hypercholesterolemia, unspecified: Secondary | ICD-10-CM

## 2020-04-28 DIAGNOSIS — B351 Tinea unguium: Secondary | ICD-10-CM

## 2020-04-28 DIAGNOSIS — I129 Hypertensive chronic kidney disease with stage 1 through stage 4 chronic kidney disease, or unspecified chronic kidney disease: Secondary | ICD-10-CM

## 2020-04-28 DIAGNOSIS — E1122 Type 2 diabetes mellitus with diabetic chronic kidney disease: Secondary | ICD-10-CM

## 2020-04-28 NOTE — Patient Instructions (Signed)
Diabetes Mellitus and Foot Care Foot care is an important part of your health, especially when you have diabetes. Diabetes may cause you to have problems because of poor blood flow (circulation) to your feet and legs, which can cause your skin to:  Become thinner and drier.  Break more easily.  Heal more slowly.  Peel and crack. You may also have nerve damage (neuropathy) in your legs and feet, causing decreased feeling in them. This means that you may not notice minor injuries to your feet that could lead to more serious problems. Noticing and addressing any potential problems early is the best way to prevent future foot problems. How to care for your feet Foot hygiene  Wash your feet daily with warm water and mild soap. Do not use hot water. Then, pat your feet and the areas between your toes until they are completely dry. Do not soak your feet as this can dry your skin.  Trim your toenails straight across. Do not dig under them or around the cuticle. File the edges of your nails with an emery board or nail file.  Apply a moisturizing lotion or petroleum jelly to the skin on your feet and to dry, brittle toenails. Use lotion that does not contain alcohol and is unscented. Do not apply lotion between your toes. Shoes and socks  Wear clean socks or stockings every day. Make sure they are not too tight. Do not wear knee-high stockings since they may decrease blood flow to your legs.  Wear shoes that fit properly and have enough cushioning. Always look in your shoes before you put them on to be sure there are no objects inside.  To break in new shoes, wear them for just a few hours a day. This prevents injuries on your feet. Wounds, scrapes, corns, and calluses  Check your feet daily for blisters, cuts, bruises, sores, and redness. If you cannot see the bottom of your feet, use a mirror or ask someone for help.  Do not cut corns or calluses or try to remove them with medicine.  If you  find a minor scrape, cut, or break in the skin on your feet, keep it and the skin around it clean and dry. You may clean these areas with mild soap and water. Do not clean the area with peroxide, alcohol, or iodine.  If you have a wound, scrape, corn, or callus on your foot, look at it several times a day to make sure it is healing and not infected. Check for: ? Redness, swelling, or pain. ? Fluid or blood. ? Warmth. ? Pus or a bad smell. General instructions  Do not cross your legs. This may decrease blood flow to your feet.  Do not use heating pads or hot water bottles on your feet. They may burn your skin. If you have lost feeling in your feet or legs, you may not know this is happening until it is too late.  Protect your feet from hot and cold by wearing shoes, such as at the beach or on hot pavement.  Schedule a complete foot exam at least once a year (annually) or more often if you have foot problems. If you have foot problems, report any cuts, sores, or bruises to your health care provider immediately. Contact a health care provider if:  You have a medical condition that increases your risk of infection and you have any cuts, sores, or bruises on your feet.  You have an injury that is not   healing.  You have redness on your legs or feet.  You feel burning or tingling in your legs or feet.  You have pain or cramps in your legs and feet.  Your legs or feet are numb.  Your feet always feel cold.  You have pain around a toenail. Get help right away if:  You have a wound, scrape, corn, or callus on your foot and: ? You have pain, swelling, or redness that gets worse. ? You have fluid or blood coming from the wound, scrape, corn, or callus. ? Your wound, scrape, corn, or callus feels warm to the touch. ? You have pus or a bad smell coming from the wound, scrape, corn, or callus. ? You have a fever. ? You have a red line going up your leg. Summary  Check your feet every day  for cuts, sores, red spots, swelling, and blisters.  Moisturize feet and legs daily.  Wear shoes that fit properly and have enough cushioning.  If you have foot problems, report any cuts, sores, or bruises to your health care provider immediately.  Schedule a complete foot exam at least once a year (annually) or more often if you have foot problems. This information is not intended to replace advice given to you by your health care provider. Make sure you discuss any questions you have with your health care provider. Document Revised: 09/05/2019 Document Reviewed: 01/14/2017 Elsevier Patient Education  2020 [employer]  Peripheral Neuropathy Peripheral neuropathy is a type of nerve damage. It affects nerves that carry signals between the spinal cord and the arms, legs, and the rest of the body (peripheral nerves). It does not affect nerves in the spinal cord or brain. In peripheral neuropathy, one nerve or a group of nerves may be damaged. Peripheral neuropathy is a broad category that includes many specific nerve disorders, like diabetic neuropathy, hereditary neuropathy, and carpal tunnel syndrome. What are the causes? This condition may be caused by:  Diabetes. This is the most common cause of peripheral neuropathy.  Nerve injury.  Pressure or stress on a nerve that lasts a long time.  Lack (deficiency) of B vitamins. This can result from alcoholism, poor diet, or a restricted diet.  Infections.  Autoimmune diseases, such as rheumatoid arthritis and systemic lupus erythematosus.  Nerve diseases that are passed from parent to child (inherited).  Some medicines, such as cancer medicines (chemotherapy).  Poisonous (toxic) substances, such as lead and mercury.  Too little blood flowing to the legs.  Kidney disease.  Thyroid disease. In some cases, the cause of this condition is not known. What are the signs or symptoms? Symptoms of this condition depend on which of your  nerves is damaged. Common symptoms include:  Loss of feeling (numbness) in the feet, hands, or both.  Tingling in the feet, hands, or both.  Burning pain.  Very sensitive skin.  Weakness.  Not being able to move a part of the body (paralysis).  Muscle twitching.  Clumsiness or poor coordination.  Loss of balance.  Not being able to control your bladder.  Feeling dizzy.  Sexual problems. How is this diagnosed? Diagnosing and finding the cause of peripheral neuropathy can be difficult. Your health care provider will take your medical history and do a physical exam. A neurological exam will also be done. This involves checking things that are affected by your brain, spinal cord, and nerves (nervous system). For example, your health care provider will check your reflexes, how you move, and   what you can feel. You may have other tests, such as:  Blood tests.  Electromyogram (EMG) and nerve conduction tests. These tests check nerve function and how well the nerves are controlling the muscles.  Imaging tests, such as CT scans or MRI to rule out other causes of your symptoms.  Removing a small piece of nerve to be examined in a lab (nerve biopsy). This is rare.  Removing and examining a small amount of the fluid that surrounds the brain and spinal cord (lumbar puncture). This is rare. How is this treated? Treatment for this condition may involve:  Treating the underlying cause of the neuropathy, such as diabetes, kidney disease, or vitamin deficiencies.  Stopping medicines that can cause neuropathy, such as chemotherapy.  Medicine to relieve pain. Medicines may include: ? Prescription or over-the-counter pain medicine. ? Antiseizure medicine. ? Antidepressants. ? Pain-relieving patches that are applied to painful areas of skin.  Surgery to relieve pressure on a nerve or to destroy a nerve that is causing pain.  Physical therapy to help improve movement and  balance.  Devices to help you move around (assistive devices). Follow these instructions at home: Medicines  Take over-the-counter and prescription medicines only as told by your health care provider. Do not take any other medicines without first asking your health care provider.  Do not drive or use heavy machinery while taking prescription pain medicine. Lifestyle   Do not use any products that contain nicotine or tobacco, such as cigarettes and e-cigarettes. Smoking keeps blood from reaching damaged nerves. If you need help quitting, ask your health care provider.  Avoid or limit alcohol. Too much alcohol can cause a vitamin B deficiency, and vitamin B is needed for healthy nerves.  Eat a healthy diet. This includes: ? Eating foods that are high in fiber, such as fresh fruits and vegetables, whole grains, and beans. ? Limiting foods that are high in fat and processed sugars, such as fried or sweet foods. General instructions   If you have diabetes, work closely with your health care provider to keep your blood sugar under control.  If you have numbness in your feet: ? Check every day for signs of injury or infection. Watch for redness, warmth, and swelling. ? Wear padded socks and comfortable shoes. These help protect your feet.  Develop a good support system. Living with peripheral neuropathy can be stressful. Consider talking with a mental health specialist or joining a support group.  Use assistive devices and attend physical therapy as told by your health care provider. This may include using a walker or a cane.  Keep all follow-up visits as told by your health care provider. This is important. Contact a health care provider if:  You have new signs or symptoms of peripheral neuropathy.  You are struggling emotionally from dealing with peripheral neuropathy.  Your pain is not well-controlled. Get help right away if:  You have an injury or infection that is not healing  normally.  You develop new weakness in an arm or leg.  You fall frequently. Summary  Peripheral neuropathy is when the nerves in the arms, or legs are damaged, resulting in numbness, weakness, or pain.  There are many causes of peripheral neuropathy, including diabetes, pinched nerves, vitamin deficiencies, autoimmune disease, and hereditary conditions.  Diagnosing and finding the cause of peripheral neuropathy can be difficult. Your health care provider will take your medical history, do a physical exam, and do tests, including blood tests and nerve function tests.    Treatment involves treating the underlying cause of the neuropathy and taking medicines to help control pain. Physical therapy and assistive devices may also help. This information is not intended to replace advice given to you by your health care provider. Make sure you discuss any questions you have with your health care provider. Document Revised: 11/25/2017 Document Reviewed: 02/21/2017 Elsevier Patient Education  2020 [employer] Inc[employer]  

## 2020-04-28 NOTE — Progress Notes (Signed)
Subjective: Molly Meyer presents today for follow up of at risk foot care with history of diabetic neuropathy and painful mycotic nails b/l that are difficult to trim. Pain interferes with ambulation. Aggravating factors include wearing enclosed shoe gear. Pain is relieved with periodic professional debridement.   She voices no new pedal problems on today's visit.   Allergies  Allergen Reactions  . Penicillins Rash    Thinks she may be able to take some forms now.Has patient had a PCN reaction causing immediate rash, facial/tongue/throat swelling, SOB or lightheadedness with hypotension: No Has patient had a PCN reaction causing severe rash involving mucus membranes or skin necrosis: No Has patient had a PCN reaction that required hospitalization No Has patient had a PCN reaction occurring within the last 10 years: No If all of the above answers are "NO", then may proceed with Cephalosporin use.    Objective: There were no vitals filed for this visit.  Pt 86 y.o. year old female  in NAD. AAO x 3.   Vascular Examination:  Capillary refill time to digits immediate b/l. Palpable DP pulses b/l. Palpable PT pulses b/l. Nonpalpable PT pulses b/l. Pedal hair present b/l. Pedal hair absent b/l Skin temperature gradient within normal limits b/l. Evidence of chronic venous insufficiency b/l LE.  Dermatological Examination: Pedal skin is thin shiny, atrophic bilaterally. No open wounds bilaterally. No interdigital macerations bilaterally. Toenails 1-5 b/l elongated, dystrophic, thickened, crumbly with subungual debris and tenderness to dorsal palpation.  Musculoskeletal: Normal muscle strength 5/5 to all lower extremity muscle groups bilaterally. No pain crepitus or joint limitation noted with ROM b/l. Hammertoes noted to the 1-5 bilaterally. Pes planus deformity noted b/l.  Utilizes walker for ambulation assistance.  Neurological: Protective sensation decreased with 10 gram monofilament b/l.  Vibratory sensation diminished b/l.  Assessment: 1. Pain due to onychomycosis of toenails of both feet    Plan: -No new findings. No new orders. -Continue diabetic foot care principles. Literature dispensed on today.  -Toenails 1-5 b/l were debrided in length and girth with sterile nail nippers and dremel without iatrogenic bleeding.  -Patient to continue soft, supportive shoe gear daily. -Patient to report any pedal injuries to medical professional immediately. -Patient/POA to call should there be question/concern in the interim.  Return in about 3 months (around 07/29/2020) for diabetic nail trim.  Marzetta Board, DPM

## 2020-04-28 NOTE — Telephone Encounter (Signed)
The pt was notified that her samples of linzess is ready for pickup. 

## 2020-04-28 NOTE — Chronic Care Management (AMB) (Signed)
  Chronic Care Management   Follow Up Note   04/28/2020 Name: [name] MRN: [id number] DOB: 22-Apr-1924  Referred by: [name], MD Reason for referral : Chronic Care Management ([facility] RN CCM Case Closure)   Molly Meyer is a 86 y.o. year old female who is a primary care patient of [name], MD. The CCM team was consulted for assistance with chronic disease management and care coordination needs.    Review of patient status, including review of consultants reports, relevant laboratory and other test results, and collaboration with appropriate care team members and the patient's provider was performed as part of comprehensive patient evaluation and provision of chronic care management services.    The care management team is available to follow up with the patient after provider conversation with the patient regarding recommendation for care management engagement and subsequent re-referral to the care management team.  CCM enrollment status changed to "previously enrolled".    Barb Merino, RN, BSN, CCM Care Management Coordinator [facility]  Direct Phone: [phone]

## 2020-05-22 ENCOUNTER — Telehealth: Payer: Self-pay

## 2020-05-22 NOTE — Telephone Encounter (Signed)
The pt was notified that samples of linzess is available for pickup.

## 2020-06-02 ENCOUNTER — Other Ambulatory Visit: Payer: Self-pay | Admitting: Cardiology

## 2020-06-16 ENCOUNTER — Encounter: Payer: Self-pay | Admitting: Internal Medicine

## 2020-06-16 ENCOUNTER — Ambulatory Visit (INDEPENDENT_AMBULATORY_CARE_PROVIDER_SITE_OTHER): Payer: Medicare Other | Admitting: Internal Medicine

## 2020-06-16 ENCOUNTER — Other Ambulatory Visit: Payer: Self-pay

## 2020-06-16 VITALS — BP 112/66 | HR 87 | Temp 97.8°F | Ht 68.0 in | Wt 167.4 lb

## 2020-06-16 DIAGNOSIS — E1122 Type 2 diabetes mellitus with diabetic chronic kidney disease: Secondary | ICD-10-CM

## 2020-06-16 DIAGNOSIS — N182 Chronic kidney disease, stage 2 (mild): Secondary | ICD-10-CM

## 2020-06-16 DIAGNOSIS — I129 Hypertensive chronic kidney disease with stage 1 through stage 4 chronic kidney disease, or unspecified chronic kidney disease: Secondary | ICD-10-CM

## 2020-06-16 DIAGNOSIS — E663 Overweight: Secondary | ICD-10-CM | POA: Diagnosis not present

## 2020-06-16 DIAGNOSIS — N3941 Urge incontinence: Secondary | ICD-10-CM | POA: Diagnosis not present

## 2020-06-16 NOTE — Patient Instructions (Signed)

## 2020-06-16 NOTE — Progress Notes (Signed)
This visit occurred during the SARS-CoV-2 public health emergency.  Safety protocols were in place, including screening questions prior to the visit, additional usage of staff PPE, and extensive cleaning of exam room while observing appropriate contact time as indicated for disinfecting solutions.  Subjective:     Patient ID: Molly Meyer , female    DOB: 1924/09/15 , 86 y.o.   MRN: [id number]   Chief Complaint  Patient presents with  . Diabetes  . Hypertension    HPI  She presents today for DM/HTN check. She reports compliance with meds.   Diabetes She presents for her follow-up diabetic visit. She has type 2 diabetes mellitus. Her disease course has been stable. There are no hypoglycemic associated symptoms. Pertinent negatives for diabetes include no blurred vision and no chest pain. There are no hypoglycemic complications. Diabetic complications include nephropathy. Risk factors for coronary artery disease include diabetes mellitus, dyslipidemia, hypertension, post-menopausal and sedentary lifestyle. Her weight is stable. Her breakfast blood glucose is taken between 8-9 am. Her breakfast blood glucose range is generally 90-110 mg/dl. An ACE inhibitor/angiotensin II receptor blocker is being taken. She sees a podiatrist.Eye exam is current.  Hypertension This is a chronic problem. The current episode started more than 1 year ago. The problem has been gradually improving since onset. The problem is controlled. Pertinent negatives include no blurred vision, chest pain, palpitations or shortness of breath.     Past Medical History:  Diagnosis Date  . Degenerative disc disease, lumbar   . Diabetes mellitus without complication (HCC)   . Hypertension      Family History  Problem Relation Age of Onset  . Hyperlipidemia Father      Current Outpatient Medications:  .  ACCU-CHEK SMARTVIEW test strip, CHECK BLOOD SUGARS ONCE BEFORE BREAKFAST DX: E11.65, Disp: 100 each, Rfl: 11 .   Accu-Chek Softclix Lancets lancets, Use as instructed to check blood sugars 1 time per day dx: e11.22, Disp: 150 each, Rfl: 2 .  acetaminophen (TYLENOL) 500 MG tablet, Take 2 tablets (1,000 mg total) by mouth every 6 (six) hours as needed for mild pain or moderate pain., Disp: 30 tablet, Rfl: 0 .  Cholecalciferol (VITAMIN D3) 2000 UNITS capsule, Take 2,000 Units by mouth daily. 1 qd, Disp: , Rfl:  .  linaclotide (LINZESS) 72 MCG capsule, Take 1 capsule (72 mcg total) by mouth daily before breakfast., Disp: 30 capsule, Rfl: 1 .  lisinopril-hydrochlorothiazide (ZESTORETIC) 10-12.5 MG tablet, TAKE 1 TABLET DAILY, Disp: 90 tablet, Rfl: 2 .  simvastatin (ZOCOR) 40 MG tablet, TAKE 1 TABLET BY MOUTH EVERYDAY AT BEDTIME, Disp: 90 tablet, Rfl: 2   Allergies  Allergen Reactions  . Penicillins Rash    Thinks she may be able to take some forms now.Has patient had a PCN reaction causing immediate rash, facial/tongue/throat swelling, SOB or lightheadedness with hypotension: No Has patient had a PCN reaction causing severe rash involving mucus membranes or skin necrosis: No Has patient had a PCN reaction that required hospitalization No Has patient had a PCN reaction occurring within the last 10 years: No If all of the above answers are "NO", then may proceed with Cephalosporin use.     Review of Systems  Constitutional: Negative.   Eyes: Negative for blurred vision.  Respiratory: Negative.  Negative for shortness of breath.   Cardiovascular: Negative.  Negative for chest pain and palpitations.  Gastrointestinal: Negative.   Neurological: Negative.   Psychiatric/Behavioral: Negative.      Today's Vitals   06/16/20  1410  BP: 112/66  Pulse: 87  Temp: 97.8 F (36.6 C)  TempSrc: Oral  Weight: 167 lb 6.4 oz (75.9 kg)  Height: '5\' 8"'$  (1.727 m)  PainSc: 0-No pain   Body mass index is 25.45 kg/m.   Objective:  Physical Exam Vitals and nursing note reviewed.  Constitutional:      Appearance:  Normal appearance.  HENT:     Head: Normocephalic and atraumatic.  Cardiovascular:     Rate and Rhythm: Normal rate and regular rhythm.     Heart sounds: Normal heart sounds.  Pulmonary:     Effort: Pulmonary effort is normal.     Breath sounds: Normal breath sounds.  Skin:    General: Skin is warm.  Neurological:     General: No focal deficit present.     Mental Status: She is alert.  Psychiatric:        Mood and Affect: Mood normal.        Behavior: Behavior normal.         Assessment And Plan:     1. Type 2 diabetes mellitus with stage 2 chronic kidney disease, without long-term current use of insulin (HCC)  Chronic, I will check labs as listed below. This has been stable for the past several months. I will adjust meds as needed. She will rto in 4-6 months for re-evaluation.   - Hemoglobin A1c - BMP8+EGFR  2. Benign hypertension with CKD (chronic kidney disease), stage II  Chronic, well controlled. She will continue with current meds. She is encouraged to avoid adding salt to her foods.   3. Overweight (BMI 25.0-29.9)  BMI 25.  Her weight is acceptable for her demographic. She is encouraged to perform chair exercises while seated/watching TV.   4. Urge incontinence of urine  Chronic. She has had improved sx with use of Myrbetriq. She is given samples when available. Will check with CCM pharmacist to see if patient assistance program is available.     Maximino Greenland, MD    THE PATIENT IS ENCOURAGED TO PRACTICE SOCIAL DISTANCING DUE TO THE COVID-19 PANDEMIC.

## 2020-06-17 LAB — BMP8+EGFR
BUN/Creatinine Ratio: 23 (ref 12–28)
BUN: 17 mg/dL (ref 10–36)
CO2: 24 mmol/L (ref 20–29)
Calcium: 10 mg/dL (ref 8.7–10.3)
Chloride: 104 mmol/L (ref 96–106)
Creatinine, Ser: 0.73 mg/dL (ref 0.57–1.00)
GFR calc Af Amer: 82 mL/min/{1.73_m2} (ref >59)
GFR calc non Af Amer: 71 mL/min/{1.73_m2} (ref >59)
Glucose: 115 mg/dL — ABNORMAL HIGH (ref 65–99)
Potassium: 4 mmol/L (ref 3.5–5.2)
Sodium: 141 mmol/L (ref 134–144)

## 2020-06-17 LAB — HEMOGLOBIN A1C
Est. average glucose Bld gHb Est-mCnc: 128 mg/dL
Hgb A1c MFr Bld: 6.1 % — ABNORMAL HIGH (ref 4.8–5.6)

## 2020-06-19 ENCOUNTER — Telehealth: Payer: Self-pay

## 2020-06-19 NOTE — Telephone Encounter (Signed)
-----   Message from [name], MD sent at 06/18/2020  7:17 PM EDT ----- Your hba1c is great at 6.1.  Your kidney fxn is stable.

## 2020-07-23 ENCOUNTER — Telehealth: Payer: Self-pay

## 2020-07-23 NOTE — Telephone Encounter (Signed)
Returned pt's call and left a message explaining to the pt that I called her pharmacy and the pharmacist stated that the pt will not be able to refill her medication simvastatin 40 mg tablet until August 14, because pt got a 90 day supply on 06/02/20 and that the pt could call her pharmacy to get a better understanding why.

## 2020-07-28 ENCOUNTER — Telehealth: Payer: Self-pay

## 2020-07-28 NOTE — Telephone Encounter (Signed)
The patient was notified that her samples of linzess is ready for pickup.

## 2020-07-29 ENCOUNTER — Ambulatory Visit (INDEPENDENT_AMBULATORY_CARE_PROVIDER_SITE_OTHER): Payer: Medicare Other | Admitting: Podiatry

## 2020-07-29 ENCOUNTER — Other Ambulatory Visit: Payer: Self-pay

## 2020-07-29 ENCOUNTER — Encounter: Payer: Self-pay | Admitting: Podiatry

## 2020-07-29 DIAGNOSIS — M2010 Hallux valgus (acquired), unspecified foot: Secondary | ICD-10-CM

## 2020-07-29 DIAGNOSIS — M2142 Flat foot [pes planus] (acquired), left foot: Secondary | ICD-10-CM

## 2020-07-29 DIAGNOSIS — M2141 Flat foot [pes planus] (acquired), right foot: Secondary | ICD-10-CM

## 2020-07-29 DIAGNOSIS — M79674 Pain in right toe(s): Secondary | ICD-10-CM | POA: Diagnosis not present

## 2020-07-29 DIAGNOSIS — M2041 Other hammer toe(s) (acquired), right foot: Secondary | ICD-10-CM

## 2020-07-29 DIAGNOSIS — B351 Tinea unguium: Secondary | ICD-10-CM

## 2020-07-29 DIAGNOSIS — E1142 Type 2 diabetes mellitus with diabetic polyneuropathy: Secondary | ICD-10-CM

## 2020-07-29 DIAGNOSIS — M79675 Pain in left toe(s): Secondary | ICD-10-CM

## 2020-07-29 DIAGNOSIS — M2042 Other hammer toe(s) (acquired), left foot: Secondary | ICD-10-CM

## 2020-07-30 NOTE — Progress Notes (Signed)
Subjective:  Patient ID: Molly Meyer, female    DOB: 18-Jul-1924,  MRN: [id number]  86 y.o. female presents with preventative diabetic foot care and painful thick toenails that are difficult to trim. Pain interferes with ambulation. Aggravating factors include wearing enclosed shoe gear. Pain is relieved with periodic professional debridement.   PCP is Dr. [name]. Last visit was 06/16/2020.  She voices no new pedal concerns on today's visit.  Review of Systems: Negative except as noted in the HPI.  Past Medical History:  Diagnosis Date  . Degenerative disc disease, lumbar   . Diabetes mellitus without complication ([place])   . Hypertension    Past Surgical History:  Procedure Laterality Date  . KNEE SURGERY     Patient Active Problem List   Diagnosis Date Noted  . Urge incontinence of urine 06/16/2020  . Dyslipidemia 09/14/2019  . AAA (abdominal aortic aneurysm) without rupture ([place]) 10/16/2018  . Type 2 diabetes mellitus with chronic kidney disease ([place]) 09/15/2018  . Benign hypertension with CKD (chronic kidney disease), stage II 09/15/2018  . Nephropathy 09/15/2018  . Onychomycosis 01/01/2015  . Subluxation of foot, acquired 01/01/2015  . Pain in lower limb 01/01/2015  . Diabetic neuropathy associated with diabetes mellitus due to underlying condition ([place]) 01/01/2015  . Disorder of kidney 07/21/2014  . Malaise and fatigue 05/05/2014  . Disorder of nasal cavity 04/28/2014  . Acute upper respiratory infection 04/28/2014  . Basal cell papilloma 10/18/2013  . Diabetic kidney ([place]) 10/18/2013  . Benign hypertensive kidney disease 10/18/2013  . Chronic kidney disease (CKD), stage II (mild) 10/18/2013  . Hypercholesterolemia without hypertriglyceridemia 10/18/2013  . Disorder of bone and articular cartilage 10/18/2013  . Arthralgia of multiple joints 10/18/2013  . Frank hematuria 10/18/2013  . Overweight (BMI 25.0-29.9) 10/17/2013    Current Outpatient Medications:   .  ACCU-CHEK SMARTVIEW test strip, CHECK BLOOD SUGARS ONCE BEFORE BREAKFAST DX: E11.65, Disp: 100 each, Rfl: 11 .  Accu-Chek Softclix Lancets lancets, Use as instructed to check blood sugars 1 time per day dx: e11.22, Disp: 150 each, Rfl: 2 .  acetaminophen (TYLENOL) 500 MG tablet, Take 2 tablets (1,000 mg total) by mouth every 6 (six) hours as needed for mild pain or moderate pain., Disp: 30 tablet, Rfl: 0 .  Cholecalciferol (VITAMIN D3) 2000 UNITS capsule, Take 2,000 Units by mouth daily. 1 qd, Disp: , Rfl:  .  linaclotide (LINZESS) 72 MCG capsule, Take 1 capsule (72 mcg total) by mouth daily before breakfast., Disp: 30 capsule, Rfl: 1 .  lisinopril-hydrochlorothiazide (ZESTORETIC) 10-12.5 MG tablet, TAKE 1 TABLET DAILY, Disp: 90 tablet, Rfl: 2 .  simvastatin (ZOCOR) 40 MG tablet, TAKE 1 TABLET BY MOUTH EVERYDAY AT BEDTIME, Disp: 90 tablet, Rfl: 2 Allergies  Allergen Reactions  . Penicillins Rash    Thinks she may be able to take some forms now.Has patient had a PCN reaction causing immediate rash, facial/tongue/throat swelling, SOB or lightheadedness with hypotension: No Has patient had a PCN reaction causing severe rash involving mucus membranes or skin necrosis: No Has patient had a PCN reaction that required hospitalization No Has patient had a PCN reaction occurring within the last 10 years: No If all of the above answers are "NO", then may proceed with Cephalosporin use.   Social History   Tobacco Use  Smoking Status Never Smoker  Smokeless Tobacco Never Used   Objective:  There were no vitals filed for this visit. Constitutional Patient is a pleasant 86 y.o. Caucasian female WD, WN in  NAD.Marland Kitchen AAO x 3.  Vascular Capillary refill time to digits immediate b/l. Palpable pedal pulses b/l LE. Pedal hair sparse. Lower extremity skin temperature gradient within normal limits. No edema noted b/l lower extremities. No cyanosis or clubbing noted.  Neurologic Normal speech. Oriented to  person, place, and time. Protective sensation diminished with 10g monofilament b/l. Vibratory sensation diminished b/l.  Dermatologic Pedal skin with normal turgor, texture and tone bilaterally. No open wounds bilaterally. No interdigital macerations bilaterally. Toenails 1-5 b/l elongated, discolored, dystrophic, thickened, crumbly with subungual debris and tenderness to dorsal palpation. Incurvated nailplate lateral border(s) R hallux.  Nail border hypertrophy absent. There is tenderness to palpation. Sign(s) of infection: no clinical signs of infection noted on examination today..  Orthopedic: Normal muscle strength 5/5 to all lower extremity muscle groups bilaterally. No pain crepitus or joint limitation noted with ROM b/l. Hallux valgus with bunion deformity noted b/l lower extremities. Hammertoes noted to the 1-5 bilaterally. Pes planus deformity noted b/l.  Utilizes walker for ambulation assistance.   Hemoglobin A1C Latest Ref Rng & Units 06/16/2020 02/13/2020 10/03/2019  HGBA1C 4.8 - 5.6 % 6.1(H) 6.2(H) 6.2(H)  Some recent data might be hidden   Assessment:   1. Pain due to onychomycosis of toenails of both feet   2. Acquired hammertoes of both feet   3. Pes planus of both feet   4. Acquired hallux valgus, unspecified laterality   5. Diabetic peripheral neuropathy associated with type 2 diabetes mellitus ([place])    Plan:  Patient was evaluated and treated and all questions answered.  Onychomycosis with pain -Nails palliatively debridement as below. -Educated on self-care  Procedure: Nail Debridement Rationale: Pain Type of Debridement: manual, sharp debridement. Instrumentation: Nail nipper, rotary burr. Number of Nails: 10  -Examined patient. -Toenails 1-5 b/l were debrided in length and girth with sterile nail nippers and dremel without iatrogenic bleeding.  -Offending nail border debrided and curretaged R hallux utilizing sterile nail nipper and currette. Border(s) cleansed with  alcohol and TAO applied. Patient instructed to apply triple antibiotic ointment to R hallux once daily for 7 days. -Patient to report any pedal injuries to medical professional immediately. -Patient to continue soft, supportive shoe gear daily. -Patient/POA to call should there be question/concern in the interim.  Return in about 3 months (around 10/29/2020).  Marzetta Board, DPM

## 2020-08-11 ENCOUNTER — Other Ambulatory Visit: Payer: Self-pay | Admitting: Internal Medicine

## 2020-09-30 ENCOUNTER — Telehealth: Payer: Self-pay

## 2020-09-30 NOTE — Telephone Encounter (Signed)
The pt was notified that her samples of linzess is ready for pickup.

## 2020-10-20 DIAGNOSIS — Z23 Encounter for immunization: Secondary | ICD-10-CM | POA: Diagnosis not present

## 2020-11-04 ENCOUNTER — Ambulatory Visit: Payer: Medicare Other | Admitting: Podiatry

## 2020-11-19 ENCOUNTER — Ambulatory Visit: Payer: Medicare Other | Admitting: Internal Medicine

## 2020-12-03 ENCOUNTER — Encounter: Payer: Self-pay | Admitting: Internal Medicine

## 2020-12-03 ENCOUNTER — Ambulatory Visit (INDEPENDENT_AMBULATORY_CARE_PROVIDER_SITE_OTHER): Payer: Medicare Other | Admitting: Internal Medicine

## 2020-12-03 ENCOUNTER — Ambulatory Visit (INDEPENDENT_AMBULATORY_CARE_PROVIDER_SITE_OTHER): Payer: Medicare Other

## 2020-12-03 ENCOUNTER — Other Ambulatory Visit: Payer: Self-pay

## 2020-12-03 VITALS — BP 114/68 | HR 73 | Temp 98.1°F | Ht 68.0 in | Wt 167.0 lb

## 2020-12-03 VITALS — BP 114/68 | HR 73 | Temp 98.1°F | Ht 68.0 in | Wt 157.0 lb

## 2020-12-03 DIAGNOSIS — Z23 Encounter for immunization: Secondary | ICD-10-CM

## 2020-12-03 DIAGNOSIS — K5909 Other constipation: Secondary | ICD-10-CM

## 2020-12-03 DIAGNOSIS — E663 Overweight: Secondary | ICD-10-CM

## 2020-12-03 DIAGNOSIS — I129 Hypertensive chronic kidney disease with stage 1 through stage 4 chronic kidney disease, or unspecified chronic kidney disease: Secondary | ICD-10-CM | POA: Diagnosis not present

## 2020-12-03 DIAGNOSIS — N3941 Urge incontinence: Secondary | ICD-10-CM

## 2020-12-03 DIAGNOSIS — Z0000 Encounter for general adult medical examination without abnormal findings: Secondary | ICD-10-CM | POA: Diagnosis not present

## 2020-12-03 DIAGNOSIS — E1122 Type 2 diabetes mellitus with diabetic chronic kidney disease: Secondary | ICD-10-CM | POA: Diagnosis not present

## 2020-12-03 DIAGNOSIS — E7800 Pure hypercholesterolemia, unspecified: Secondary | ICD-10-CM

## 2020-12-03 DIAGNOSIS — N182 Chronic kidney disease, stage 2 (mild): Secondary | ICD-10-CM | POA: Diagnosis not present

## 2020-12-03 LAB — POCT URINALYSIS DIPSTICK
Bilirubin, UA: NEGATIVE
Glucose, UA: NEGATIVE
Ketones, UA: NEGATIVE
Leukocytes, UA: NEGATIVE
Nitrite, UA: NEGATIVE
Protein, UA: NEGATIVE
Spec Grav, UA: >=1.03 — AB (ref 1.010–1.025)
Urobilinogen, UA: 0.2 E.U./dL
pH, UA: 6 (ref 5.0–8.0)

## 2020-12-03 LAB — POCT UA - MICROALBUMIN
Albumin/Creatinine Ratio, Urine, POC: <30
Creatinine, POC: 300 mg/dL
Microalbumin Ur, POC: 30 mg/L

## 2020-12-03 MED ORDER — LINACLOTIDE 72 MCG PO CAPS
72.0000 ug | ORAL_CAPSULE | Freq: Every day | ORAL | 5 refills | Status: DC
Start: 2020-12-03 — End: 2023-10-17

## 2020-12-03 MED ORDER — BOOSTRIX 5-2.5-18.5 LF-MCG/0.5 IM SUSP: 0.5000 mL | Freq: Once | INTRAMUSCULAR | 0 refills | Status: AC

## 2020-12-03 NOTE — Progress Notes (Signed)
This visit occurred during the SARS-CoV-2 public health emergency.  Safety protocols were in place, including screening questions prior to the visit, additional usage of staff PPE, and extensive cleaning of exam room while observing appropriate contact time as indicated for disinfecting solutions.  Subjective:   Molly Meyer is a 86 y.o. female who presents for Medicare Annual (Subsequent) preventive examination.  Review of Systems     Cardiac Risk Factors include: advanced age (>71men, >78 women);dyslipidemia;hypertension;sedentary lifestyle     Objective:    Today's Vitals   12/03/20 1527  BP: 114/68  Pulse: 73  Temp: 98.1 F (36.7 C)  TempSrc: Oral  Weight: 157 lb (71.2 kg)  Height: 5\' 8"  (1.727 m)   Body mass index is 23.87 kg/m.  Advanced Directives 12/03/2020 11/14/2019 06/05/2019 09/28/2018 09/12/2018 04/14/2016 06/20/2015  Does Patient Have a Medical Advance Directive? Yes Yes Yes Yes Yes Yes No  Type of Paramedic of [place];Living will [place];Living will [place];Living will [place];Living will [place] - -  Does patient want to make changes to medical advance directive? - - No - Patient declined No - Patient declined - - -  Copy of [place] in Chart? Yes - validated most recent copy scanned in chart (See row information) No - copy requested - No - copy requested - - -  Would patient like information on creating a medical advance directive? - - - - - - No - patient declined information    Current Medications (verified) Outpatient Encounter Medications as of 12/03/2020  Medication Sig  . ACCU-CHEK SMARTVIEW test strip CHECK BLOOD SUGARS ONCE BEFORE BREAKFAST DX: E11.65  . Accu-Chek Softclix Lancets lancets Use as instructed to check blood sugars 1 time per day dx: e11.22  . acetaminophen (TYLENOL) 500 MG tablet Take 2 tablets (1,000 mg total) by  mouth every 6 (six) hours as needed for mild pain or moderate pain.  . Cholecalciferol (VITAMIN D3) 2000 UNITS capsule Take 2,000 Units by mouth daily. 1 qd  . linaclotide (LINZESS) 72 MCG capsule Take 1 capsule (72 mcg total) by mouth daily before breakfast.  . lisinopril-hydrochlorothiazide (ZESTORETIC) 10-12.5 MG tablet TAKE 1 TABLET DAILY  . simvastatin (ZOCOR) 40 MG tablet TAKE 1 TABLET BY MOUTH EVERYDAY AT BEDTIME  . Tdap (BOOSTRIX) 5-2.5-18.5 LF-MCG/0.5 injection Inject 0.5 mLs into the muscle once for 1 dose.   No facility-administered encounter medications on file as of 12/03/2020.    Allergies (verified) Penicillins   History: Past Medical History:  Diagnosis Date  . Degenerative disc disease, lumbar   . Diabetes mellitus without complication ([place])   . Hypertension    Past Surgical History:  Procedure Laterality Date  . KNEE SURGERY     Family History  Problem Relation Age of Onset  . Hyperlipidemia Father    Social History   Socioeconomic History  . Marital status: Widowed    Spouse name: Not on file  . Number of children: Not on file  . Years of education: Not on file  . Highest education level: Not on file  Occupational History  . Occupation: retired  Tobacco Use  . Smoking status: Never Smoker  . Smokeless tobacco: Never Used  Vaping Use  . Vaping Use: Never used  Substance and Sexual Activity  . Alcohol use: No    Alcohol/week: 0.0 standard drinks  . Drug use: Never  . Sexual activity: Not Currently  Other Topics  Concern  . Not on file  Social History Narrative  . Not on file   Social Determinants of Health   Financial Resource Strain: Low Risk   . Difficulty of Paying Living Expenses: Not hard at all  Food Insecurity: No Food Insecurity  . Worried About Charity fundraiser in the Last Year: Never true  . Ran Out of Food in the Last Year: Never true  Transportation Needs: No Transportation Needs  . Lack of Transportation (Medical): No  .  Lack of Transportation (Non-Medical): No  Physical Activity: Inactive  . Days of Exercise per Week: 0 days  . Minutes of Exercise per Session: 0 min  Stress: No Stress Concern Present  . Feeling of Stress : Not at all  Social Connections:   . Frequency of Communication with Friends and Family: Not on file  . Frequency of Social Gatherings with Friends and Family: Not on file  . Attends Religious Services: Not on file  . Active Member of Clubs or Organizations: Not on file  . Attends Archivist Meetings: Not on file  . Marital Status: Not on file    Tobacco Counseling Counseling given: Not Answered   Clinical Intake:  Pre-visit preparation completed: Yes  Pain : No/denies pain     Nutritional Status: BMI of 19-24  Normal Nutritional Risks: None Diabetes: Yes  How often do you need to have someone help you when you read instructions, pamphlets, or other written materials from your doctor or pharmacy?: 1 - Never  Diabetic? Yes Nutrition Risk Assessment:  Has the patient had any N/V/D within the last 2 months?  No  Does the patient have any non-healing wounds?  No  Has the patient had any unintentional weight loss or weight gain?  No   Diabetes:  Is the patient diabetic?  Yes  If diabetic, was a CBG obtained today?  No  Did the patient bring in their glucometer from home?  No  How often do you monitor your CBG's? none.   Financial Strains and Diabetes Management:  Are you having any financial strains with the device, your supplies or your medication? No .  Does the patient want to be seen by Chronic Care Management for management of their diabetes?  No  Would the patient like to be referred to a Nutritionist or for Diabetic Management?  No   Diabetic Exams:  Diabetic Eye Exam: Completed 12/13/2019 Diabetic Foot Exam: Completed 01/28/2020  Interpreter Needed?: No  Information entered by :: 12th grade   Activities of Daily Living In your present state  of health, do you have any difficulty performing the following activities: 12/03/2020 12/03/2020  Hearing? Tempie Donning  Comment hearing aides -  Vision? N Y  Difficulty concentrating or making decisions? N N  Walking or climbing stairs? Y Y  Dressing or bathing? N Y  Doing errands, shopping? Y Y  Comment does not drive Facilities manager and eating ? N -  Using the Toilet? N -  In the past six months, have you accidently leaked urine? Y -  Do you have problems with loss of bowel control? Y -  Managing your Medications? N -  Managing your Finances? N -  Housekeeping or managing your Housekeeping? N -  Some recent data might be hidden    Patient Care Team: [name], MD as PCP - General (Internal Medicine) Sheard, Briscoe Burns, DPM (Inactive) as Consulting Physician (Podiatry)  Indicate any recent Medical Services you  may have received from other than [facility] providers in the past year (date may be approximate).     Assessment:   This is a routine wellness examination for [place].  Hearing/Vision screen  Hearing Screening   125Hz  250Hz  500Hz  1000Hz  2000Hz  3000Hz  4000Hz  6000Hz  8000Hz   Right ear:           Left ear:           Vision Screening Comments: Regular eye exams, Dr. Kathrin Penner  Dietary issues and exercise activities discussed: Current Exercise Habits: The patient does not participate in regular exercise at present  Goals    .  DIET - INCREASE WATER INTAKE (pt-stated)      Work on pushing water intake. Daughter helps with water reminders.    Marland Kitchen  DIET - INCREASE WATER INTAKE      11/14/2019, still trying to increase water    .  Patient Stated      12/03/2020, wants to eat healthier      Depression Screen PHQ 2/9 Scores 12/03/2020 12/03/2020 11/14/2019 10/03/2019 01/30/2019 09/28/2018  PHQ - 2 Score 0 0 0 0 0 0  PHQ- 9 Score - - 0 - - -    Fall Risk Fall Risk  12/03/2020 12/03/2020 11/14/2019 10/03/2019 07/03/2019  Falls in the past year? 0 0 1 0 0  Comment - - - - -  Number  falls in past yr: - - 1 - -  Injury with Fall? - - 0 - -  Risk for fall due to : Medication side effect;Impaired mobility - Impaired balance/gait;Impaired mobility;Medication side effect;History of fall(s) - -  Follow up Falls evaluation completed;Education provided;Falls prevention discussed - Falls evaluation completed;Education provided;Falls prevention discussed - -    FALL RISK PREVENTION PERTAINING TO THE HOME:  Any stairs in or around the home? No  If so, are there any without handrails? n/a Home free of loose throw rugs in walkways, pet beds, electrical cords, etc? Yes  Adequate lighting in your home to reduce risk of falls? Yes   ASSISTIVE DEVICES UTILIZED TO PREVENT FALLS:  Life alert? No  Use of a cane, walker or w/c? Yes  Grab bars in the bathroom? Yes  Shower chair or bench in shower? No  Elevated toilet seat or a handicapped toilet? Yes   TIMED UP AND GO:  Was the test performed? No .  .   Gait slow and steady with assistive device  Cognitive Function:     6CIT Screen 12/03/2020 11/14/2019 09/28/2018  What Year? 0 points 0 points 0 points  What month? 0 points 0 points 0 points  What time? 0 points 0 points 0 points  Count back from 20 0 points 0 points 0 points  Months in reverse 0 points 0 points 0 points  Repeat phrase 0 points 0 points 0 points  Total Score 0 0 0    Immunizations Immunization History  Administered Date(s) Administered  . Influenza, High Dose Seasonal PF 10/01/2015, 10/09/2016, 10/07/2017, 09/28/2018, 10/03/2019, 10/20/2020  . PFIZER SARS-COV-2 Vaccination 01/15/2020, 02/05/2020, 10/20/2020  . Pneumococcal Polysaccharide-23 07/03/2019    TDAP status: Due, Education has been provided regarding the importance of this vaccine. Advised may receive this vaccine at local pharmacy or Health Dept. Aware to provide a copy of the vaccination record if obtained from local pharmacy or Health Dept. Verbalized acceptance and understanding.  Flu  Vaccine status: Up to date  Pneumococcal vaccine status: Up to date  Covid-19 vaccine status: Completed vaccines  Qualifies  for Shingles Vaccine? Yes   Zostavax completed No   Shingrix Completed?: No.    Education has been provided regarding the importance of this vaccine. Patient has been advised to call insurance company to determine out of pocket expense if they have not yet received this vaccine. Advised may also receive vaccine at local pharmacy or Health Dept. Verbalized acceptance and understanding.  Screening Tests Health Maintenance  Topic Date Due  . TETANUS/TDAP  03/24/2015  . OPHTHALMOLOGY EXAM  12/12/2020  . HEMOGLOBIN A1C  12/16/2020  . FOOT EXAM  01/27/2021  . INFLUENZA VACCINE  Completed  . DEXA SCAN  Completed  . COVID-19 Vaccine  Completed  . PNA vac Low Risk Adult  Completed    Health Maintenance  Health Maintenance Due  Topic Date Due  . TETANUS/TDAP  03/24/2015    Colorectal cancer screening: No longer required.   Mammogram status: No longer required due to age.  Bone Density status: Completed 01/31/2001.   Lung Cancer Screening: (Low Dose CT Chest recommended if Age 56-80 years, 30 pack-year currently smoking OR have quit w/in 15years.) does not qualify.   Lung Cancer Screening Referral: no  Additional Screening:  Hepatitis C Screening: does not qualify;   Vision Screening: Recommended annual ophthalmology exams for early detection of glaucoma and other disorders of the eye. Is the patient up to date with their annual eye exam?  Yes  Who is the provider or what is the name of the office in which the patient attends annual eye exams? Dr. Kathrin Penner If pt is not established with a provider, would they like to be referred to a provider to establish care? No .   Dental Screening: Recommended annual dental exams for proper oral hygiene  Community Resource Referral / Chronic Care Management: CRR required this visit?  No   CCM required this visit?   No      Plan:     I have personally reviewed and noted the following in the patient's chart:   . Medical and social history . Use of alcohol, tobacco or illicit drugs  . Current medications and supplements . Functional ability and status . Nutritional status . Physical activity . Advanced directives . List of other physicians . Hospitalizations, surgeries, and ER visits in previous 12 months . Vitals . Screenings to include cognitive, depression, and falls . Referrals and appointments  In addition, I have reviewed and discussed with patient certain preventive protocols, quality metrics, and best practice recommendations. A written personalized care plan for preventive services as well as general preventive health recommendations were provided to patient.     Kellie Simmering, LPN   [id number]   Nurse Notes:

## 2020-12-03 NOTE — Patient Instructions (Signed)
Diabetes Mellitus and Foot Care Foot care is an important part of your health, especially when you have diabetes. Diabetes may cause you to have problems because of poor blood flow (circulation) to your feet and legs, which can cause your skin to:  Become thinner and drier.  Break more easily.  Heal more slowly.  Peel and crack. You may also have nerve damage (neuropathy) in your legs and feet, causing decreased feeling in them. This means that you may not notice minor injuries to your feet that could lead to more serious problems. Noticing and addressing any potential problems early is the best way to prevent future foot problems. How to care for your feet Foot hygiene  Wash your feet daily with warm water and mild soap. Do not use hot water. Then, pat your feet and the areas between your toes until they are completely dry. Do not soak your feet as this can dry your skin.  Trim your toenails straight across. Do not dig under them or around the cuticle. File the edges of your nails with an emery board or nail file.  Apply a moisturizing lotion or petroleum jelly to the skin on your feet and to dry, brittle toenails. Use lotion that does not contain alcohol and is unscented. Do not apply lotion between your toes. Shoes and socks  Wear clean socks or stockings every day. Make sure they are not too tight. Do not wear knee-high stockings since they may decrease blood flow to your legs.  Wear shoes that fit properly and have enough cushioning. Always look in your shoes before you put them on to be sure there are no objects inside.  To break in new shoes, wear them for just a few hours a day. This prevents injuries on your feet. Wounds, scrapes, corns, and calluses  Check your feet daily for blisters, cuts, bruises, sores, and redness. If you cannot see the bottom of your feet, use a mirror or ask someone for help.  Do not cut corns or calluses or try to remove them with medicine.  If you  find a minor scrape, cut, or break in the skin on your feet, keep it and the skin around it clean and dry. You may clean these areas with mild soap and water. Do not clean the area with peroxide, alcohol, or iodine.  If you have a wound, scrape, corn, or callus on your foot, look at it several times a day to make sure it is healing and not infected. Check for: ? Redness, swelling, or pain. ? Fluid or blood. ? Warmth. ? Pus or a bad smell. General instructions  Do not cross your legs. This may decrease blood flow to your feet.  Do not use heating pads or hot water bottles on your feet. They may burn your skin. If you have lost feeling in your feet or legs, you may not know this is happening until it is too late.  Protect your feet from hot and cold by wearing shoes, such as at the beach or on hot pavement.  Schedule a complete foot exam at least once a year (annually) or more often if you have foot problems. If you have foot problems, report any cuts, sores, or bruises to your health care provider immediately. Contact a health care provider if:  You have a medical condition that increases your risk of infection and you have any cuts, sores, or bruises on your feet.  You have an injury that is not   healing.  You have redness on your legs or feet.  You feel burning or tingling in your legs or feet.  You have pain or cramps in your legs and feet.  Your legs or feet are numb.  Your feet always feel cold.  You have pain around a toenail. Get help right away if:  You have a wound, scrape, corn, or callus on your foot and: ? You have pain, swelling, or redness that gets worse. ? You have fluid or blood coming from the wound, scrape, corn, or callus. ? Your wound, scrape, corn, or callus feels warm to the touch. ? You have pus or a bad smell coming from the wound, scrape, corn, or callus. ? You have a fever. ? You have a red line going up your leg. Summary  Check your feet every day  for cuts, sores, red spots, swelling, and blisters.  Moisturize feet and legs daily.  Wear shoes that fit properly and have enough cushioning.  If you have foot problems, report any cuts, sores, or bruises to your health care provider immediately.  Schedule a complete foot exam at least once a year (annually) or more often if you have foot problems. This information is not intended to replace advice given to you by your health care provider. Make sure you discuss any questions you have with your health care provider. Document Revised: 09/05/2019 Document Reviewed: 01/14/2017 Elsevier Patient Education  2020 [employer]  

## 2020-12-03 NOTE — Patient Instructions (Signed)
Molly Meyer , Thank you for taking time to come for your Medicare Wellness Visit. I appreciate your ongoing commitment to your health goals. Please review the following plan we discussed and let me know if I can assist you in the future.   Screening recommendations/referrals: Colonoscopy: not required Mammogram: not required Bone Density: completed 01/31/2001 Recommended yearly ophthalmology/optometry visit for glaucoma screening and checkup Recommended yearly dental visit for hygiene and checkup  Vaccinations: Influenza vaccine: completed 10/20/2020, due 07/27/2021 Pneumococcal vaccine: completed 07/03/2019 Tdap vaccine: sent to pharmacy Shingles vaccine: discussed   Covid-19: 10/20/2020, 02/05/2020, 01/15/2020  Advanced directives: Please bring a copy of your POA (Power of Attorney) and/or Living Will to your next appointment.   Conditions/risks identified: none  Next appointment: Follow up in one year for your annual wellness visit    Preventive Care 65 Years and Older, Female Preventive care refers to lifestyle choices and visits with your health care provider that can promote health and wellness. What does preventive care include?  A yearly physical exam. This is also called an annual well check.  Dental exams once or twice a year.  Routine eye exams. Ask your health care provider how often you should have your eyes checked.  Personal lifestyle choices, including:  Daily care of your teeth and gums.  Regular physical activity.  Eating a healthy diet.  Avoiding tobacco and drug use.  Limiting alcohol use.  Practicing safe sex.  Taking low-dose aspirin every day.  Taking vitamin and mineral supplements as recommended by your health care provider. What happens during an annual well check? The services and screenings done by your health care provider during your annual well check will depend on your age, overall health, lifestyle risk factors, and family history of  disease. Counseling  Your health care provider may ask you questions about your:  Alcohol use.  Tobacco use.  Drug use.  Emotional well-being.  Home and relationship well-being.  Sexual activity.  Eating habits.  History of falls.  Memory and ability to understand (cognition).  Work and work Statistician.  Reproductive health. Screening  You may have the following tests or measurements:  Height, weight, and BMI.  Blood pressure.  Lipid and cholesterol levels. These may be checked every 5 years, or more frequently if you are over 74 years old.  Skin check.  Lung cancer screening. You may have this screening every year starting at age 26 if you have a 30-pack-year history of smoking and currently smoke or have quit within the past 15 years.  Fecal occult blood test (FOBT) of the stool. You may have this test every year starting at age 49.  Flexible sigmoidoscopy or colonoscopy. You may have a sigmoidoscopy every 5 years or a colonoscopy every 10 years starting at age 4.  Hepatitis C blood test.  Hepatitis B blood test.  Sexually transmitted disease (STD) testing.  Diabetes screening. This is done by checking your blood sugar (glucose) after you have not eaten for a while (fasting). You may have this done every 1-3 years.  Bone density scan. This is done to screen for osteoporosis. You may have this done starting at age 85.  Mammogram. This may be done every 1-2 years. Talk to your health care provider about how often you should have regular mammograms. Talk with your health care provider about your test results, treatment options, and if necessary, the need for more tests. Vaccines  Your health care provider may recommend certain vaccines, such as:  Influenza vaccine.  This is recommended every year.  Tetanus, diphtheria, and acellular pertussis (Tdap, Td) vaccine. You may need a Td booster every 10 years.  Zoster vaccine. You may need this after age  40.  Pneumococcal 13-valent conjugate (PCV13) vaccine. One dose is recommended after age 73.  Pneumococcal polysaccharide (PPSV23) vaccine. One dose is recommended after age 31. Talk to your health care provider about which screenings and vaccines you need and how often you need them. This information is not intended to replace advice given to you by your health care provider. Make sure you discuss any questions you have with your health care provider. Document Released: 01/09/2016 Document Revised: 09/01/2016 Document Reviewed: 10/14/2015 Elsevier Interactive Patient Education  2017 [place] Prevention in the Home Falls can cause injuries. They can happen to people of all ages. There are many things you can do to make your home safe and to help prevent falls. What can I do on the outside of my home?  Regularly fix the edges of walkways and driveways and fix any cracks.  Remove anything that might make you trip as you walk through a door, such as a raised step or threshold.  Trim any bushes or trees on the path to your home.  Use bright outdoor lighting.  Clear any walking paths of anything that might make someone trip, such as rocks or tools.  Regularly check to see if handrails are loose or broken. Make sure that both sides of any steps have handrails.  Any raised decks and porches should have guardrails on the edges.  Have any leaves, snow, or ice cleared regularly.  Use sand or salt on walking paths during winter.  Clean up any spills in your garage right away. This includes oil or grease spills. What can I do in the bathroom?  Use night lights.  Install grab bars by the toilet and in the tub and shower. Do not use towel bars as grab bars.  Use non-skid mats or decals in the tub or shower.  If you need to sit down in the shower, use a plastic, non-slip stool.  Keep the floor dry. Clean up any water that spills on the floor as soon as it happens.  Remove  soap buildup in the tub or shower regularly.  Attach bath mats securely with double-sided non-slip rug tape.  Do not have throw rugs and other things on the floor that can make you trip. What can I do in the bedroom?  Use night lights.  Make sure that you have a light by your bed that is easy to reach.  Do not use any sheets or blankets that are too big for your bed. They should not hang down onto the floor.  Have a firm chair that has side arms. You can use this for support while you get dressed.  Do not have throw rugs and other things on the floor that can make you trip. What can I do in the kitchen?  Clean up any spills right away.  Avoid walking on wet floors.  Keep items that you use a lot in easy-to-reach places.  If you need to reach something above you, use a strong step stool that has a grab bar.  Keep electrical cords out of the way.  Do not use floor polish or wax that makes floors slippery. If you must use wax, use non-skid floor wax.  Do not have throw rugs and other things on the floor that can make  you trip. What can I do with my stairs?  Do not leave any items on the stairs.  Make sure that there are handrails on both sides of the stairs and use them. Fix handrails that are broken or loose. Make sure that handrails are as long as the stairways.  Check any carpeting to make sure that it is firmly attached to the stairs. Fix any carpet that is loose or worn.  Avoid having throw rugs at the top or bottom of the stairs. If you do have throw rugs, attach them to the floor with carpet tape.  Make sure that you have a light switch at the top of the stairs and the bottom of the stairs. If you do not have them, ask someone to add them for you. What else can I do to help prevent falls?  Wear shoes that:  Do not have high heels.  Have rubber bottoms.  Are comfortable and fit you well.  Are closed at the toe. Do not wear sandals.  If you use a  stepladder:  Make sure that it is fully opened. Do not climb a closed stepladder.  Make sure that both sides of the stepladder are locked into place.  Ask someone to hold it for you, if possible.  Clearly mark and make sure that you can see:  Any grab bars or handrails.  First and last steps.  Where the edge of each step is.  Use tools that help you move around (mobility aids) if they are needed. These include:  Canes.  Walkers.  Scooters.  Crutches.  Turn on the lights when you go into a dark area. Replace any light bulbs as soon as they burn out.  Set up your furniture so you have a clear path. Avoid moving your furniture around.  If any of your floors are uneven, fix them.  If there are any pets around you, be aware of where they are.  Review your medicines with your doctor. Some medicines can make you feel dizzy. This can increase your chance of falling. Ask your doctor what other things that you can do to help prevent falls. This information is not intended to replace advice given to you by your health care provider. Make sure you discuss any questions you have with your health care provider. Document Released: 10/09/2009 Document Revised: 05/20/2016 Document Reviewed: 01/17/2015 Elsevier Interactive Patient Education  2017 [employer].

## 2020-12-03 NOTE — Progress Notes (Signed)
I,Katawbba Wiggins,acting as a Education administrator for Maximino Greenland, MD.,have documented all relevant documentation on the behalf of Maximino Greenland, MD,as directed by  Maximino Greenland, MD while in the presence of Maximino Greenland, MD.  This visit occurred during the SARS-CoV-2 public health emergency.  Safety protocols were in place, including screening questions prior to the visit, additional usage of staff PPE, and extensive cleaning of exam room while observing appropriate contact time as indicated for disinfecting solutions.  Subjective:     Patient ID: Molly Meyer , female    DOB: [birth year] , 86 y.o.   MRN: [id number]   Chief Complaint  Patient presents with  . Diabetes    HPI  She presents today for DM/HTN check. She reports compliance with meds. She offers no complaints at this time.   Diabetes She presents for her follow-up diabetic visit. She has type 2 diabetes mellitus. Her disease course has been stable. There are no hypoglycemic associated symptoms. Pertinent negatives for diabetes include no blurred vision and no chest pain. There are no hypoglycemic complications. Diabetic complications include nephropathy. Risk factors for coronary artery disease include diabetes mellitus, dyslipidemia, hypertension, post-menopausal and sedentary lifestyle. Her weight is stable. Her breakfast blood glucose is taken between 8-9 am. Her breakfast blood glucose range is generally 90-110 mg/dl. An ACE inhibitor/angiotensin II receptor blocker is being taken. She sees a podiatrist.Eye exam is current.  Hypertension This is a chronic problem. The current episode started more than 1 year ago. The problem has been gradually improving since onset. The problem is controlled. Pertinent negatives include no blurred vision, chest pain, palpitations or shortness of breath.     Past Medical History:  Diagnosis Date  . Degenerative disc disease, lumbar   . Diabetes mellitus without complication ([place])   .  Hypertension      Family History  Problem Relation Age of Onset  . Hyperlipidemia Father      Current Outpatient Medications:  .  ACCU-CHEK SMARTVIEW test strip, CHECK BLOOD SUGARS ONCE BEFORE BREAKFAST DX: E11.65, Disp: 100 each, Rfl: 11 .  Accu-Chek Softclix Lancets lancets, Use as instructed to check blood sugars 1 time per day dx: e11.22, Disp: 150 each, Rfl: 2 .  acetaminophen (TYLENOL) 500 MG tablet, Take 2 tablets (1,000 mg total) by mouth every 6 (six) hours as needed for mild pain or moderate pain., Disp: 30 tablet, Rfl: 0 .  Cholecalciferol (VITAMIN D3) 2000 UNITS capsule, Take 2,000 Units by mouth daily. 1 qd, Disp: , Rfl:  .  linaclotide (LINZESS) 72 MCG capsule, Take 1 capsule (72 mcg total) by mouth daily before breakfast., Disp: 30 capsule, Rfl: 5 .  lisinopril-hydrochlorothiazide (ZESTORETIC) 10-12.5 MG tablet, TAKE 1 TABLET DAILY, Disp: 90 tablet, Rfl: 2 .  simvastatin (ZOCOR) 40 MG tablet, TAKE 1 TABLET BY MOUTH EVERYDAY AT BEDTIME, Disp: 90 tablet, Rfl: 2   Allergies  Allergen Reactions  . Penicillins Rash    Thinks she may be able to take some forms now.Has patient had a PCN reaction causing immediate rash, facial/tongue/throat swelling, SOB or lightheadedness with hypotension: No Has patient had a PCN reaction causing severe rash involving mucus membranes or skin necrosis: No Has patient had a PCN reaction that required hospitalization No Has patient had a PCN reaction occurring within the last 10 years: No If all of the above answers are "NO", then may proceed with Cephalosporin use.     Review of Systems  Constitutional: Negative.   Eyes: Negative for  blurred vision.  Respiratory: Negative.  Negative for shortness of breath.   Cardiovascular: Negative.  Negative for chest pain and palpitations.  Gastrointestinal: Negative.   Neurological: Negative.   Psychiatric/Behavioral: Negative.      Today's Vitals   12/03/20 1455  BP: 114/68  Pulse: 73  Temp:  98.1 F (36.7 C)  TempSrc: Oral  Weight: 167 lb (75.8 kg)  Height: $Remove'5\' 8"'EmNSqXa$  (1.727 m)  PainSc: 0-No pain   Body mass index is 25.39 kg/m.  Wt Readings from Last 3 Encounters:  12/03/20 157 lb (71.2 kg)  12/03/20 167 lb (75.8 kg)  06/16/20 167 lb 6.4 oz (75.9 kg)   Objective:  Physical Exam Vitals and nursing note reviewed.  Constitutional:      Appearance: Normal appearance.  HENT:     Head: Normocephalic and atraumatic.  Cardiovascular:     Rate and Rhythm: Normal rate and regular rhythm.     Heart sounds: Normal heart sounds.  Pulmonary:     Effort: Pulmonary effort is normal.     Breath sounds: Normal breath sounds.  Skin:    General: Skin is warm.  Neurological:     General: No focal deficit present.     Mental Status: She is alert.  Psychiatric:        Mood and Affect: Mood normal.        Behavior: Behavior normal.         Assessment And Plan:     1. Type 2 diabetes mellitus with stage 2 chronic kidney disease, without long-term current use of insulin (HCC) Comments: Chronic, this has been well controlled off meds. I will check labs as listed below.  - CMP14+EGFR - CBC - Lipid panel - Hemoglobin A1c - POCT Urinalysis Dipstick (81002) - POCT UA - Microalbumin  2. Benign hypertension with CKD (chronic kidney disease), stage II Comments: Chronic, well controlled. She will continue with current meds. She is encouraged to avoid adding salt to her foods.  - CMP14+EGFR  3. Pure hypercholesterolemia Comments: Chronic, she is on statin therapy.  - CMP14+EGFR - Lipid panel  4. Urge incontinence of urine Comments: Chronic, she reports her sx improve with use of Myrbetriq. Can only get samples, no patient assistance offered for this medication.   5. Chronic constipation Comments: Chronic, improved with Linzess.   6. Overweight (BMI 25.0-29.9) Her BMI is acceptable for her demographic. Encouraged to perform chair exercises while watching TV.   Patient was  given opportunity to ask questions. Patient verbalized understanding of the plan and was able to repeat key elements of the plan. All questions were answered to their satisfaction.  Maximino Greenland, MD   I, Maximino Greenland, MD, have reviewed all documentation for this visit. The documentation on 12/27/20 for the exam, diagnosis, procedures, and orders are all accurate and complete.  THE PATIENT IS ENCOURAGED TO PRACTICE SOCIAL DISTANCING DUE TO THE COVID-19 PANDEMIC.

## 2020-12-04 LAB — CMP14+EGFR
ALT: 13 IU/L (ref 0–32)
AST: 14 IU/L (ref 0–40)
Albumin/Globulin Ratio: 1.8 (ref 1.2–2.2)
Albumin: 4.1 g/dL (ref 3.5–4.6)
Alkaline Phosphatase: 45 IU/L (ref 44–121)
BUN/Creatinine Ratio: 21 (ref 12–28)
BUN: 15 mg/dL (ref 10–36)
Bilirubin Total: 0.4 mg/dL (ref 0.0–1.2)
CO2: 24 mmol/L (ref 20–29)
Calcium: 9.7 mg/dL (ref 8.7–10.3)
Chloride: 102 mmol/L (ref 96–106)
Creatinine, Ser: 0.73 mg/dL (ref 0.57–1.00)
GFR calc Af Amer: 82 mL/min/{1.73_m2} (ref >59)
GFR calc non Af Amer: 71 mL/min/{1.73_m2} (ref >59)
Globulin, Total: 2.3 g/dL (ref 1.5–4.5)
Glucose: 99 mg/dL (ref 65–99)
Potassium: 4.4 mmol/L (ref 3.5–5.2)
Sodium: 141 mmol/L (ref 134–144)
Total Protein: 6.4 g/dL (ref 6.0–8.5)

## 2020-12-04 LAB — HEMOGLOBIN A1C
Est. average glucose Bld gHb Est-mCnc: 131 mg/dL
Hgb A1c MFr Bld: 6.2 % — ABNORMAL HIGH (ref 4.8–5.6)

## 2020-12-04 LAB — LIPID PANEL
Chol/HDL Ratio: 2.9 ratio (ref 0.0–4.4)
Cholesterol, Total: 156 mg/dL (ref 100–199)
HDL: 53 mg/dL (ref >39)
LDL Chol Calc (NIH): 85 mg/dL (ref 0–99)
Triglycerides: 97 mg/dL (ref 0–149)
VLDL Cholesterol Cal: 18 mg/dL (ref 5–40)

## 2020-12-04 LAB — CBC
Hematocrit: 41.2 % (ref 34.0–46.6)
Hemoglobin: 14 g/dL (ref 11.1–15.9)
MCH: 29.9 pg (ref 26.6–33.0)
MCHC: 34 g/dL (ref 31.5–35.7)
MCV: 88 fL (ref 79–97)
Platelets: 179 10*3/uL (ref 150–450)
RBC: 4.68 x10E6/uL (ref 3.77–5.28)
RDW: 12.6 % (ref 11.7–15.4)
WBC: 5.8 10*3/uL (ref 3.4–10.8)

## 2020-12-15 DIAGNOSIS — E119 Type 2 diabetes mellitus without complications: Secondary | ICD-10-CM | POA: Diagnosis not present

## 2020-12-15 DIAGNOSIS — H43813 Vitreous degeneration, bilateral: Secondary | ICD-10-CM | POA: Diagnosis not present

## 2020-12-15 DIAGNOSIS — H524 Presbyopia: Secondary | ICD-10-CM | POA: Diagnosis not present

## 2020-12-15 DIAGNOSIS — H04123 Dry eye syndrome of bilateral lacrimal glands: Secondary | ICD-10-CM | POA: Diagnosis not present

## 2020-12-15 LAB — HM DIABETES EYE EXAM

## 2020-12-16 ENCOUNTER — Other Ambulatory Visit: Payer: Self-pay

## 2020-12-16 ENCOUNTER — Encounter: Payer: Self-pay | Admitting: Podiatry

## 2020-12-16 ENCOUNTER — Ambulatory Visit (INDEPENDENT_AMBULATORY_CARE_PROVIDER_SITE_OTHER): Payer: Medicare Other | Admitting: Podiatry

## 2020-12-16 DIAGNOSIS — M79675 Pain in left toe(s): Secondary | ICD-10-CM | POA: Diagnosis not present

## 2020-12-16 DIAGNOSIS — M79674 Pain in right toe(s): Secondary | ICD-10-CM

## 2020-12-16 DIAGNOSIS — Z800 Family history of malignant neoplasm of digestive organs: Secondary | ICD-10-CM | POA: Insufficient documentation

## 2020-12-16 DIAGNOSIS — B351 Tinea unguium: Secondary | ICD-10-CM | POA: Diagnosis not present

## 2020-12-16 DIAGNOSIS — M2141 Flat foot [pes planus] (acquired), right foot: Secondary | ICD-10-CM

## 2020-12-16 DIAGNOSIS — K5730 Diverticulosis of large intestine without perforation or abscess without bleeding: Secondary | ICD-10-CM | POA: Insufficient documentation

## 2020-12-16 DIAGNOSIS — E1142 Type 2 diabetes mellitus with diabetic polyneuropathy: Secondary | ICD-10-CM

## 2020-12-16 DIAGNOSIS — M2042 Other hammer toe(s) (acquired), left foot: Secondary | ICD-10-CM

## 2020-12-16 DIAGNOSIS — M2142 Flat foot [pes planus] (acquired), left foot: Secondary | ICD-10-CM

## 2020-12-16 DIAGNOSIS — M2041 Other hammer toe(s) (acquired), right foot: Secondary | ICD-10-CM

## 2020-12-18 ENCOUNTER — Encounter: Payer: Self-pay | Admitting: Internal Medicine

## 2020-12-22 NOTE — Progress Notes (Signed)
Subjective:  Patient ID: Molly Meyer, female    DOB: 12/22/1924,  MRN: [id number]  86 y.o. female presents with preventative diabetic foot care and painful thick toenails that are difficult to trim. Pain interferes with ambulation. Aggravating factors include wearing enclosed shoe gear. Pain is relieved with periodic professional debridement.   PCP is Dr. [name]. Last visit was 12/03/2020.  Review of Systems: Negative except as noted in the HPI.  Past Medical History:  Diagnosis Date  . Degenerative disc disease, lumbar   . Diabetes mellitus without complication ([place])   . Hypertension    Past Surgical History:  Procedure Laterality Date  . KNEE SURGERY     Patient Active Problem List   Diagnosis Date Noted  . Diverticular disease of colon 12/16/2020  . Family history of malignant neoplasm of gastrointestinal tract 12/16/2020  . Urge incontinence of urine 06/16/2020  . Dyslipidemia 09/14/2019  . AAA (abdominal aortic aneurysm) without rupture ([place]) 10/16/2018  . Type 2 diabetes mellitus with chronic kidney disease ([place]) 09/15/2018  . Benign hypertension with CKD (chronic kidney disease), stage II 09/15/2018  . Nephropathy 09/15/2018  . Onychomycosis 01/01/2015  . Subluxation of foot, acquired 01/01/2015  . Pain in lower limb 01/01/2015  . Diabetic neuropathy associated with diabetes mellitus due to underlying condition ([place]) 01/01/2015  . Disorder of kidney 07/21/2014  . Malaise and fatigue 05/05/2014  . Disorder of nasal cavity 04/28/2014  . Acute upper respiratory infection 04/28/2014  . Basal cell papilloma 10/18/2013  . Diabetic kidney ([place]) 10/18/2013  . Benign hypertensive kidney disease 10/18/2013  . Chronic kidney disease (CKD), stage II (mild) 10/18/2013  . Hypercholesterolemia without hypertriglyceridemia 10/18/2013  . Disorder of bone and articular cartilage 10/18/2013  . Arthralgia of multiple joints 10/18/2013  . Frank hematuria 10/18/2013  .  Overweight (BMI 25.0-29.9) 10/17/2013    Current Outpatient Medications:  .  ACCU-CHEK SMARTVIEW test strip, CHECK BLOOD SUGARS ONCE BEFORE BREAKFAST DX: E11.65, Disp: 100 each, Rfl: 11 .  Accu-Chek Softclix Lancets lancets, Use as instructed to check blood sugars 1 time per day dx: e11.22, Disp: 150 each, Rfl: 2 .  acetaminophen (TYLENOL) 500 MG tablet, Take 2 tablets (1,000 mg total) by mouth every 6 (six) hours as needed for mild pain or moderate pain., Disp: 30 tablet, Rfl: 0 .  Cholecalciferol (VITAMIN D3) 2000 UNITS capsule, Take 2,000 Units by mouth daily. 1 qd, Disp: , Rfl:  .  linaclotide (LINZESS) 72 MCG capsule, Take 1 capsule (72 mcg total) by mouth daily before breakfast., Disp: 30 capsule, Rfl: 5 .  lisinopril-hydrochlorothiazide (ZESTORETIC) 10-12.5 MG tablet, TAKE 1 TABLET DAILY, Disp: 90 tablet, Rfl: 2 .  simvastatin (ZOCOR) 40 MG tablet, TAKE 1 TABLET BY MOUTH EVERYDAY AT BEDTIME, Disp: 90 tablet, Rfl: 2 Allergies  Allergen Reactions  . Penicillins Rash    Thinks she may be able to take some forms now.Has patient had a PCN reaction causing immediate rash, facial/tongue/throat swelling, SOB or lightheadedness with hypotension: No Has patient had a PCN reaction causing severe rash involving mucus membranes or skin necrosis: No Has patient had a PCN reaction that required hospitalization No Has patient had a PCN reaction occurring within the last 10 years: No If all of the above answers are "NO", then may proceed with Cephalosporin use.   Social History   Tobacco Use  Smoking Status Never Smoker  Smokeless Tobacco Never Used   Objective:  There were no vitals filed for this visit. Constitutional Patient is a  pleasant 86 y.o. Caucasian female WD, WN in NAD.Marland Kitchen AAO x 3.  Vascular Capillary refill time to digits immediate b/l. Palpable pedal pulses b/l LE. Pedal hair sparse. Lower extremity skin temperature gradient within normal limits. No edema noted b/l lower extremities. No  cyanosis or clubbing noted.  Neurologic Normal speech. Oriented to person, place, and time. Protective sensation diminished with 10g monofilament b/l. Vibratory sensation diminished b/l.  Dermatologic Pedal skin with normal turgor, texture and tone bilaterally. No open wounds bilaterally. No interdigital macerations bilaterally. Toenails 1-5 b/l elongated, discolored, dystrophic, thickened, crumbly with subungual debris and tenderness to dorsal palpation. Incurvated nailplate right great toe lateral border(s) with tenderness to palpation. No erythema, no edema, no drainage noted.  Orthopedic: Normal muscle strength 5/5 to all lower extremity muscle groups bilaterally. No pain crepitus or joint limitation noted with ROM b/l. Hallux valgus with bunion deformity noted b/l lower extremities. Hammertoes noted to the 1-5 bilaterally. Pes planus deformity noted b/l.  Utilizes walker for ambulation assistance.   Hemoglobin A1C Latest Ref Rng & Units 12/03/2020 06/16/2020 02/13/2020  HGBA1C 4.8 - 5.6 % 6.2(H) 6.1(H) 6.2(H)  Some recent data might be hidden   Assessment:   1. Pain due to onychomycosis of toenails of both feet   2. Acquired hammertoes of both feet   3. Pes planus of both feet   4. Diabetic peripheral neuropathy associated with type 2 diabetes mellitus (HCC)    Plan:  Patient was evaluated and treated and all questions answered.  Onychomycosis with pain -Nails palliatively debridement as below. -Educated on self-care  Procedure: Nail Debridement Rationale: Pain Type of Debridement: manual, sharp debridement. Instrumentation: Nail nipper, rotary burr. Number of Nails: 10  -Examined patient. -Continue diabetic foot care principles. -Toenails 1-5 b/l were debrided in length and girth with sterile nail nippers and dremel without iatrogenic bleeding.  -Offending nail border debrided and curretaged R hallux utilizing sterile nail nipper and currette. Border(s) cleansed with alcohol and  TAO applied.  -Patient to report any pedal injuries to medical professional immediately. -Patient to continue soft, supportive shoe gear daily. -Patient/POA to call should there be question/concern in the interim.  Return in about 3 months (around 03/16/2021) for diabetic foot care.  Freddie Breech, DPM

## 2021-03-09 ENCOUNTER — Encounter (HOSPITAL_COMMUNITY): Payer: Self-pay

## 2021-03-09 ENCOUNTER — Other Ambulatory Visit: Payer: Self-pay

## 2021-03-09 ENCOUNTER — Ambulatory Visit (HOSPITAL_COMMUNITY)
Admission: EM | Admit: 2021-03-09 | Discharge: 2021-03-09 | Disposition: A | Discharge status: Home / Self Care | Payer: Medicare Other | Attending: Family Medicine | Admitting: Family Medicine

## 2021-03-09 DIAGNOSIS — J069 Acute upper respiratory infection, unspecified: Secondary | ICD-10-CM

## 2021-03-09 MED ORDER — AZITHROMYCIN 250 MG PO TABS: ORAL_TABLET | ORAL | 0 refills | Status: DC

## 2021-03-09 NOTE — ED Provider Notes (Signed)
[place]    CSN: [id number] Arrival date & time: 03/09/21  1002      History   Chief Complaint Chief Complaint  Patient presents with  . Sinutitis    HPI Molly Meyer is a 87 y.o. female.   Presenting today with 3-day history of sinus pain and pressure, rhinorrhea and congestion.  She denies cough, chest pain, shortness of breath, abdominal pain, nausea vomiting diarrhea, fever, chills, body aches.  Has been trying over-the-counter sinus pills and vitamin C and vitamin D without relief at this time.  She denies history of seasonal allergies or chronic sinus issues.  Daughter sick with similar symptoms at this time.     Past Medical History:  Diagnosis Date  . Degenerative disc disease, lumbar   . Diabetes mellitus without complication ([place])   . Hypertension     Patient Active Problem List   Diagnosis Date Noted  . Diverticular disease of colon 12/16/2020  . Family history of malignant neoplasm of gastrointestinal tract 12/16/2020  . Urge incontinence of urine 06/16/2020  . Dyslipidemia 09/14/2019  . AAA (abdominal aortic aneurysm) without rupture ([place]) 10/16/2018  . Type 2 diabetes mellitus with chronic kidney disease ([place]) 09/15/2018  . Benign hypertension with CKD (chronic kidney disease), stage II 09/15/2018  . Nephropathy 09/15/2018  . Onychomycosis 01/01/2015  . Subluxation of foot, acquired 01/01/2015  . Pain in lower limb 01/01/2015  . Diabetic neuropathy associated with diabetes mellitus due to underlying condition ([place]) 01/01/2015  . Disorder of kidney 07/21/2014  . Malaise and fatigue 05/05/2014  . Disorder of nasal cavity 04/28/2014  . Acute upper respiratory infection 04/28/2014  . Basal cell papilloma 10/18/2013  . Diabetic kidney ([place]) 10/18/2013  . Benign hypertensive kidney disease 10/18/2013  . Chronic kidney disease (CKD), stage II (mild) 10/18/2013  . Hypercholesterolemia without hypertriglyceridemia 10/18/2013  . Disorder of  bone and articular cartilage 10/18/2013  . Arthralgia of multiple joints 10/18/2013  . [place] hematuria 10/18/2013  . Overweight (BMI 25.0-29.9) 10/17/2013    Past Surgical History:  Procedure Laterality Date  . KNEE SURGERY      OB History   No obstetric history on file.      Home Medications    Prior to Admission medications   Medication Sig Start Date End Date Taking? Authorizing Provider  azithromycin (ZITHROMAX) 250 MG tablet Take 2 tabs day one, then 1 tab daily until complete - only start if symptoms worsen over next week or two 03/09/21  Yes Volney American, PA-C  [place] test strip CHECK BLOOD SUGARS ONCE BEFORE BREAKFAST DX: E11.65 04/12/19   [name], MD  Accu-Chek Softclix Lancets lancets Use as instructed to check blood sugars 1 time per day dx: e11.22 04/24/19   [name], MD  acetaminophen (TYLENOL) 500 MG tablet Take 2 tablets (1,000 mg total) by mouth every 6 (six) hours as needed for mild pain or moderate pain. 06/20/15   Blanchie Dessert, MD  Cholecalciferol (VITAMIN D3) 2000 UNITS capsule Take 2,000 Units by mouth daily. 1 qd    [provider]  linaclotide (LINZESS) 72 MCG capsule Take 1 capsule (72 mcg total) by mouth daily before breakfast. 12/03/20   [name], MD  lisinopril-hydrochlorothiazide (ZESTORETIC) 10-12.5 MG tablet TAKE 1 TABLET DAILY 08/12/20   [name], MD  simvastatin (ZOCOR) 40 MG tablet TAKE 1 TABLET BY MOUTH EVERYDAY AT BEDTIME 06/02/20   Park Liter, MD    Family History Family History  Problem Relation  Age of Onset  . Hyperlipidemia Father     Social History Social History   Tobacco Use  . Smoking status: Never Smoker  . Smokeless tobacco: Never Used  Vaping Use  . Vaping Use: Never used  Substance Use Topics  . Alcohol use: No    Alcohol/week: 0.0 standard drinks  . Drug use: Never     Allergies   Penicillins   Review of Systems Review of Systems Per HPI Physical  Exam Triage Vital Signs ED Triage Vitals [03/09/21 1048]  Enc Vitals Group     BP 128/87     Pulse Rate 88     Resp 17     Temp 98.2 F (36.8 C)     Temp Source Oral     SpO2 94 %     Weight      Height      Head Circumference      Peak Flow      Pain Score 5     Pain Loc      Pain Edu?      Excl. in [place]?    No data found.  Updated Vital Signs BP 128/87 (BP Location: Right Arm)   Pulse 88   Temp 98.2 F (36.8 C) (Oral)   Resp 17   SpO2 94%   Visual Acuity Right Eye Distance:   Left Eye Distance:   Bilateral Distance:    Right Eye Near:   Left Eye Near:    Bilateral Near:     Physical Exam Vitals and nursing note reviewed.  Constitutional:      Appearance: Normal appearance. She is not ill-appearing.  HENT:     Head: Atraumatic.     Right Ear: Tympanic membrane normal.     Left Ear: Tympanic membrane normal.     Nose: Rhinorrhea present.     Mouth/Throat:     Mouth: Mucous membranes are moist.     Pharynx: Oropharynx is clear. Posterior oropharyngeal erythema present. No oropharyngeal exudate.  Eyes:     Extraocular Movements: Extraocular movements intact.     Conjunctiva/sclera: Conjunctivae normal.  Cardiovascular:     Rate and Rhythm: Normal rate.     Heart sounds: Normal heart sounds.  Pulmonary:     Effort: Pulmonary effort is normal.     Breath sounds: Normal breath sounds.  Abdominal:     General: Bowel sounds are normal. There is no distension.     Palpations: Abdomen is soft.     Tenderness: There is no abdominal tenderness. There is no guarding.  Musculoskeletal:        General: Normal range of motion.     Cervical back: Normal range of motion and neck supple.  Lymphadenopathy:     Cervical: No cervical adenopathy.  Skin:    General: Skin is warm and dry.  Neurological:     Mental Status: She is alert and oriented to person, place, and time.  Psychiatric:        Mood and Affect: Mood normal.        Thought Content: Thought content  normal.        Judgment: Judgment normal.      UC Treatments / Results  Labs (all labs ordered are listed, but only abnormal results are displayed) Labs Reviewed - No data to display  EKG   Radiology No results found.  Procedures Procedures (including critical care time)  Medications Ordered in UC Medications - No data to display  Initial  Impression / Assessment and Plan / UC Course  I have reviewed the triage vital signs and the nursing notes.  Pertinent labs & imaging results that were available during my care of the patient were reviewed by me and considered in my medical decision making (see chart for details).     Exam and vitals overall very reassuring today, suspect viral illness causing her symptoms today.  Discussed over-the-counter safe remedies, supportive home care and close monitoring for resolution of symptoms.  If worsening over the next week, may start azithromycin but discussed reasons why this would be appropriate down the line and to not use unless these things are happening.  Follow-up with primary care for recheck. Final Clinical Impressions(s) / UC Diagnoses   Final diagnoses:  Viral URI   Discharge Instructions   None    ED Prescriptions    Medication Sig Dispense Auth. Provider   azithromycin (ZITHROMAX) 250 MG tablet Take 2 tabs day one, then 1 tab daily until complete - only start if symptoms worsen over next week or two 6 tablet Volney American, [place]     PDMP not reviewed this encounter.   Volney American, [place] 03/09/21 1112

## 2021-03-09 NOTE — ED Triage Notes (Signed)
Pt presents with sinus pain, congestion, and intermittent drainage X 3 days.

## 2021-03-20 ENCOUNTER — Other Ambulatory Visit: Payer: Self-pay

## 2021-03-20 ENCOUNTER — Encounter: Payer: Self-pay | Admitting: Podiatry

## 2021-03-20 ENCOUNTER — Ambulatory Visit (INDEPENDENT_AMBULATORY_CARE_PROVIDER_SITE_OTHER): Payer: Medicare Other | Admitting: Podiatry

## 2021-03-20 DIAGNOSIS — B351 Tinea unguium: Secondary | ICD-10-CM | POA: Diagnosis not present

## 2021-03-20 DIAGNOSIS — Z1211 Encounter for screening for malignant neoplasm of colon: Secondary | ICD-10-CM | POA: Insufficient documentation

## 2021-03-20 DIAGNOSIS — M2041 Other hammer toe(s) (acquired), right foot: Secondary | ICD-10-CM

## 2021-03-20 DIAGNOSIS — M79675 Pain in left toe(s): Secondary | ICD-10-CM

## 2021-03-20 DIAGNOSIS — M2042 Other hammer toe(s) (acquired), left foot: Secondary | ICD-10-CM

## 2021-03-20 DIAGNOSIS — M2142 Flat foot [pes planus] (acquired), left foot: Secondary | ICD-10-CM

## 2021-03-20 DIAGNOSIS — M79674 Pain in right toe(s): Secondary | ICD-10-CM | POA: Diagnosis not present

## 2021-03-20 DIAGNOSIS — E1142 Type 2 diabetes mellitus with diabetic polyneuropathy: Secondary | ICD-10-CM

## 2021-03-20 DIAGNOSIS — M2010 Hallux valgus (acquired), unspecified foot: Secondary | ICD-10-CM

## 2021-03-20 DIAGNOSIS — M2141 Flat foot [pes planus] (acquired), right foot: Secondary | ICD-10-CM

## 2021-03-22 NOTE — Progress Notes (Signed)
  Subjective:  Patient ID: Molly Meyer, female    DOB: 1924/03/24,  MRN: [id number]  87 y.o. female presents with preventative diabetic foot care and painful thick toenails that are difficult to trim. Pain interferes with ambulation. Aggravating factors include wearing enclosed shoe gear. Pain is relieved with periodic professional debridement.   PCP is Dr. [name]. Last visit was 12/03/2020. She does not monitor her blood glucose daily.  Review of Systems: Negative except as noted in the HPI.   Allergies  Allergen Reactions  . Penicillins Rash    Thinks she may be able to take some forms now.Has patient had a PCN reaction causing immediate rash, facial/tongue/throat swelling, SOB or lightheadedness with hypotension: No Has patient had a PCN reaction causing severe rash involving mucus membranes or skin necrosis: No Has patient had a PCN reaction that required hospitalization No Has patient had a PCN reaction occurring within the last 10 years: No If all of the above answers are "NO", then may proceed with Cephalosporin use.   Objective:  There were no vitals filed for this visit. Constitutional Patient is a pleasant 87 y.o. Caucasian female WD, WN in NAD.Marland Kitchen AAO x 3.  Vascular Capillary refill time to digits immediate b/l. Palpable pedal pulses b/l LE. Pedal hair sparse. Lower extremity skin temperature gradient within normal limits. No edema noted b/l lower extremities. No cyanosis or clubbing noted.  Neurologic Normal speech. Oriented to person, place, and time. Protective sensation diminished with 10g monofilament b/l. Vibratory sensation diminished b/l.  Dermatologic Pedal skin with normal turgor, texture and tone bilaterally. No open wounds bilaterally. No interdigital macerations bilaterally. Toenails 1-5 b/l elongated, discolored, dystrophic, thickened, crumbly with subungual debris and tenderness to dorsal palpation. Incurvated nailplate right great toe lateral border(s) with  tenderness to palpation. No erythema, no edema, no drainage noted.  Orthopedic: Normal muscle strength 5/5 to all lower extremity muscle groups bilaterally. No pain crepitus or joint limitation noted with ROM b/l. Hallux valgus with bunion deformity noted b/l lower extremities. Hammertoes noted to the 1-5 bilaterally. Pes planus deformity noted b/l.  Utilizes walker for ambulation assistance.    Assessment:   1. Pain due to onychomycosis of toenails of both feet   2. Acquired hammertoes of both feet   3. Pes planus of both feet   4. Acquired hallux valgus, unspecified laterality   5. Diabetic peripheral neuropathy associated with type 2 diabetes mellitus ([place])    Plan:  Patient was evaluated and treated and all questions answered.  Onychomycosis with pain -Nails palliatively debridement as below. -Educated on self-care  Procedure: Nail Debridement Rationale: Pain Type of Debridement: manual, sharp debridement. Instrumentation: Nail nipper, rotary burr. Number of Nails: 10  -Examined patient. -Continue diabetic foot care principles. -Toenails 1-5 b/l were debrided in length and girth with sterile nail nippers and dremel without iatrogenic bleeding.  -Patient to report any pedal injuries to medical professional immediately. -Patient to continue soft, supportive shoe gear daily. -Patient/POA to call should there be question/concern in the interim.  Return in about 3 months (around 06/20/2021).  Marzetta Board, DPM

## 2021-04-22 ENCOUNTER — Ambulatory Visit (INDEPENDENT_AMBULATORY_CARE_PROVIDER_SITE_OTHER): Payer: Medicare Other | Admitting: Nurse Practitioner

## 2021-04-22 ENCOUNTER — Encounter: Payer: Self-pay | Admitting: Nurse Practitioner

## 2021-04-22 ENCOUNTER — Other Ambulatory Visit: Payer: Self-pay

## 2021-04-22 VITALS — BP 118/78 | HR 83 | Temp 97.9°F | Ht 63.6 in | Wt 161.4 lb

## 2021-04-22 DIAGNOSIS — H6123 Impacted cerumen, bilateral: Secondary | ICD-10-CM | POA: Diagnosis not present

## 2021-04-22 DIAGNOSIS — H938X1 Other specified disorders of right ear: Secondary | ICD-10-CM | POA: Diagnosis not present

## 2021-04-22 NOTE — Progress Notes (Signed)
I,Tianna Badgett,acting as a Education administrator for [employer], FNP.,have documented all relevant documentation on the behalf of Minette Brine, FNP,as directed by  Minette Brine, FNP while in the presence of Minette Brine, [place].  This visit occurred during the SARS-CoV-2 public health emergency.  Safety protocols were in place, including screening questions prior to the visit, additional usage of staff PPE, and extensive cleaning of exam room while observing appropriate contact time as indicated for disinfecting solutions.  Subjective:     Patient ID: Molly Meyer , female    DOB: 1924/08/12 , 87 y.o.   MRN: [id number]   Chief Complaint  Patient presents with  . Ear Fullness    HPI  Patient is here for ear fullness. She noticed a black substance on her wash towel after cleaning her ears.   Ear Fullness  There is pain in the right ear. This is a new problem. The current episode started in the past 7 days. The problem has been unchanged. There has been no fever. The pain is at a severity of 0/10. The patient is experiencing no pain. Associated symptoms include ear discharge and hearing loss (reports already has difficulty with hearing). Pertinent negatives include no headaches. She has tried nothing for the symptoms.     Past Medical History:  Diagnosis Date  . Degenerative disc disease, lumbar   . Diabetes mellitus without complication ([place])   . Hypertension      Family History  Problem Relation Age of Onset  . Hyperlipidemia Father      Current Outpatient Medications:  .  ACCU-CHEK [place]ART[place] test strip, CHECK BLOOD SUGARS ONCE BEFORE BREAKFAST DX: E11.65, Disp: 100 each, Rfl: 11 .  Accu-Chek Softclix Lancets lancets, Use as instructed to check blood sugars 1 time per day dx: e11.22, Disp: 150 each, Rfl: 2 .  acetaminophen (TYLENOL) 500 MG tablet, Take 2 tablets (1,000 mg total) by mouth every 6 (six) hours as needed for mild pain or moderate pain., Disp: 30 tablet, Rfl: 0 .  Atorvastatin  Calcium (LIPITOR PO), , Disp: , Rfl:  .  azithromycin (ZITHROMAX) 250 MG tablet, Take 2 tabs day one, then 1 tab daily until complete - only start if symptoms worsen over next week or two, Disp: 6 tablet, Rfl: 0 .  Cholecalciferol (VITAMIN D3 PO), , Disp: , Rfl:  .  Cholecalciferol (VITAMIN D3) 2000 UNITS capsule, Take 2,000 Units by mouth daily. 1 qd, Disp: , Rfl:  .  linaclotide (LINZESS) 72 MCG capsule, Take 1 capsule (72 mcg total) by mouth daily before breakfast., Disp: 30 capsule, Rfl: 5 .  LISINOPRIL PO, , Disp: , Rfl:  .  lisinopril-hydrochlorothiazide (ZESTORETIC) 10-12.5 MG tablet, TAKE 1 TABLET DAILY, Disp: 90 tablet, Rfl: 2 .  METFORMIN HCL PO, , Disp: , Rfl:  .  simvastatin (ZOCOR) 40 MG tablet, TAKE 1 TABLET BY MOUTH EVERYDAY AT BEDTIME, Disp: 90 tablet, Rfl: 2   Allergies  Allergen Reactions  . Penicillins Rash    Thinks she may be able to take some forms now.Has patient had a PCN reaction causing immediate rash, facial/tongue/throat swelling, SOB or lightheadedness with hypotension: No Has patient had a PCN reaction causing severe rash involving mucus membranes or skin necrosis: No Has patient had a PCN reaction that required hospitalization No Has patient had a PCN reaction occurring within the last 10 years: No If all of the above answers are "NO", then may proceed with Cephalosporin use.     Review of Systems  HENT:  Positive for ear discharge and hearing loss (reports already has difficulty with hearing).        Feels like her right ear is full  Respiratory: Negative.   Cardiovascular: Negative.   Neurological: Negative for dizziness and headaches.  Psychiatric/Behavioral: Negative.      Today's Vitals   04/22/21 0944  BP: 118/78  Pulse: 83  Temp: 97.9 F (36.6 C)  TempSrc: Oral  Weight: 161 lb 6.4 oz (73.2 kg)  Height: 5' 3.6" (1.615 m)   Body mass index is 28.05 kg/m.   Objective:  Physical Exam Constitutional:      General: She is not in acute  distress.    Appearance: Normal appearance.  HENT:     Right Ear: External ear normal. There is impacted cerumen (dark hard impacted cerumen).     Left Ear: External ear normal. There is impacted cerumen (soft cerumen).  Neurological:     General: No focal deficit present.     Mental Status: She is alert and oriented to person, place, and time.     Cranial Nerves: No cranial nerve deficit.  Psychiatric:        Mood and Affect: Mood normal.        Behavior: Behavior normal.        Thought Content: Thought content normal.        Judgment: Judgment normal.         Assessment And Plan:     1. Ear fullness, right - Ear Lavage  2. Bilateral impacted cerumen  Right ear worse than left has hard dark cerumen to right ear  Water lavage done to both ears with good results  Encouraged to avoid using qtips     Patient was given opportunity to ask questions. Patient verbalized understanding of the plan and was able to repeat key elements of the plan. All questions were answered to their satisfaction.  Minette Brine, FNP   I, Minette Brine, FNP, have reviewed all documentation for this visit. The documentation on 04/22/21 for the exam, diagnosis, procedures, and orders are all accurate and complete.   IF YOU HAVE BEEN REFERRED TO A SPECIALIST, IT MAY TAKE 1-2 WEEKS TO SCHEDULE/PROCESS THE REFERRAL. IF YOU HAVE NOT HEARD FROM US/SPECIALIST IN TWO WEEKS, PLEASE GIVE [place] A CALL AT [phone] X [phone].   THE PATIENT IS ENCOURAGED TO PRACTICE SOCIAL DISTANCING DUE TO THE COVID-19 PANDEMIC.

## 2021-04-22 NOTE — Patient Instructions (Signed)
Ear Drainage Ear drainage is the discharge of ear wax, pus, blood, or other fluids from the ear. Follow these instructions at home: Pay attention to changes in your ear drainage. Report them to your health care provider. Follow these instructions to relieve your symptoms. Protecting your ear  Do not use cotton-tipped swabs in your ear. Do not put any other objects into your ear.  Do not swim until your health care provider has approved.  Before you shower, cover a cotton ball with petroleum jelly and place it in your ear. This helps to keep water out.  Wash your hands before and after you touch your ears.   General instructions  Take over-the-counter and prescription medicines only as told by your health care provider.  Avoid any exposure to smoke.  Keep all follow-up visits as told by your health care provider. This is important. Contact a health care provider if:  You have increased drainage.  You have ear pain.  You have a fever.  Your drainage is not getting better with treatment.  Your ear drainage is bloody, white, clear, or yellow.  Your ear is red or swollen. Get help right away if you:  Have severe ear pain.  Have a severe headache.  Vomit.  Feel dizzy.  Have a seizure.  Have new hearing loss. Summary  Ear drainage is the discharge of ear wax, pus, blood, or other fluids from the ear.  Pay attention to any changes in your symptoms. Tell your health care provider about them. Follow instructions from your health care provider.  Contact your health care provider if you have more drainage, bloody drainage, ear pain, fever, or swelling.  Get help right away if you have severe pain, severe headache, vomiting, dizziness, seizure, or hearing loss. This information is not intended to replace advice given to you by your health care provider. Make sure you discuss any questions you have with your health care provider. Document Revised: 06/15/2018 Document  Reviewed: 06/15/2018 Elsevier Patient Education  2021 [employer].

## 2021-05-26 ENCOUNTER — Other Ambulatory Visit: Payer: Self-pay | Admitting: Cardiology

## 2021-05-26 NOTE — Telephone Encounter (Signed)
Refill sent to pharmacy.   Needs appointment for further refills.  

## 2021-06-02 ENCOUNTER — Other Ambulatory Visit: Payer: Self-pay | Admitting: Internal Medicine

## 2021-06-03 ENCOUNTER — Ambulatory Visit (INDEPENDENT_AMBULATORY_CARE_PROVIDER_SITE_OTHER): Payer: Medicare Other | Admitting: Internal Medicine

## 2021-06-03 ENCOUNTER — Encounter: Payer: Self-pay | Admitting: Internal Medicine

## 2021-06-03 ENCOUNTER — Other Ambulatory Visit: Payer: Self-pay

## 2021-06-03 VITALS — BP 110/74 | HR 78 | Temp 98.8°F | Ht 63.6 in | Wt 163.0 lb

## 2021-06-03 DIAGNOSIS — N182 Chronic kidney disease, stage 2 (mild): Secondary | ICD-10-CM | POA: Diagnosis not present

## 2021-06-03 DIAGNOSIS — I700 Atherosclerosis of aorta: Secondary | ICD-10-CM

## 2021-06-03 DIAGNOSIS — M5416 Radiculopathy, lumbar region: Secondary | ICD-10-CM

## 2021-06-03 DIAGNOSIS — I714 Abdominal aortic aneurysm, without rupture, unspecified: Secondary | ICD-10-CM

## 2021-06-03 DIAGNOSIS — Z79899 Other long term (current) drug therapy: Secondary | ICD-10-CM | POA: Diagnosis not present

## 2021-06-03 DIAGNOSIS — E1122 Type 2 diabetes mellitus with diabetic chronic kidney disease: Secondary | ICD-10-CM

## 2021-06-03 DIAGNOSIS — I129 Hypertensive chronic kidney disease with stage 1 through stage 4 chronic kidney disease, or unspecified chronic kidney disease: Secondary | ICD-10-CM | POA: Diagnosis not present

## 2021-06-03 NOTE — Progress Notes (Signed)
I,Katawbba Wiggins,acting as a Education administrator for Maximino Greenland, MD.,have documented all relevant documentation on the behalf of Maximino Greenland, MD,as directed by  Maximino Greenland, MD while in the presence of Maximino Greenland, MD.  This visit occurred during the SARS-CoV-2 public health emergency.  Safety protocols were in place, including screening questions prior to the visit, additional usage of staff PPE, and extensive cleaning of exam room while observing appropriate contact time as indicated for disinfecting solutions.  Subjective:     Patient ID: Molly Meyer , female    DOB: 05-23-1924 , 87 y.o.   MRN: [id number]   Chief Complaint  Patient presents with   Diabetes   Hypertension    HPI  She presents today for DM/HTN check. She reports compliance with meds. Denies headaches, chest pain and shortness of breath. She is unaccompanied today. She denies having any specific concerns or complaints at this time.   Diabetes She presents for her follow-up diabetic visit. She has type 2 diabetes mellitus. Her disease course has been stable. There are no hypoglycemic associated symptoms. Pertinent negatives for diabetes include no blurred vision and no chest pain. There are no hypoglycemic complications. Diabetic complications include nephropathy. Risk factors for coronary artery disease include diabetes mellitus, dyslipidemia, hypertension, post-menopausal and sedentary lifestyle. Her weight is stable. Her breakfast blood glucose is taken between 8-9 am. Her breakfast blood glucose range is generally 90-110 mg/dl. An ACE inhibitor/angiotensin II receptor blocker is being taken. She sees a podiatrist.Eye exam is current.  Hypertension This is a chronic problem. The current episode started more than 1 year ago. The problem has been gradually improving since onset. The problem is controlled. Pertinent negatives include no blurred vision, chest pain, palpitations or shortness of breath.    Past Medical  History:  Diagnosis Date   Degenerative disc disease, lumbar    Diabetes mellitus without complication (HCC)    Hypertension      Family History  Problem Relation Age of Onset   Hyperlipidemia Father      Current Outpatient Medications:    ACCU-CHEK SMARTVIEW test strip, CHECK BLOOD SUGARS ONCE BEFORE BREAKFAST DX: E11.65, Disp: 100 each, Rfl: 11   Accu-Chek Softclix Lancets lancets, Use as instructed to check blood sugars 1 time per day dx: e11.22, Disp: 150 each, Rfl: 2   acetaminophen (TYLENOL) 500 MG tablet, Take 2 tablets (1,000 mg total) by mouth every 6 (six) hours as needed for mild pain or moderate pain., Disp: 30 tablet, Rfl: 0   Atorvastatin Calcium (LIPITOR PO), , Disp: , Rfl:    azithromycin (ZITHROMAX) 250 MG tablet, Take 2 tabs day one, then 1 tab daily until complete - only start if symptoms worsen over next week or two, Disp: 6 tablet, Rfl: 0   Cholecalciferol (VITAMIN D3) 2000 UNITS capsule, Take 2,000 Units by mouth daily. 1 qd, Disp: , Rfl:    linaclotide (LINZESS) 72 MCG capsule, Take 1 capsule (72 mcg total) by mouth daily before breakfast., Disp: 30 capsule, Rfl: 5   LISINOPRIL PO, , Disp: , Rfl:    lisinopril-hydrochlorothiazide (ZESTORETIC) 10-12.5 MG tablet, TAKE 1 TABLET DAILY, Disp: 90 tablet, Rfl: 2   METFORMIN HCL PO, , Disp: , Rfl:    simvastatin (ZOCOR) 40 MG tablet, TAKE 1 TABLET BY MOUTH EVERYDAY AT BEDTIME, Disp: 30 tablet, Rfl: 0   Allergies  Allergen Reactions   Penicillins Rash    Thinks she may be able to take some forms now.Has patient had  a PCN reaction causing immediate rash, facial/tongue/throat swelling, SOB or lightheadedness with hypotension: No Has patient had a PCN reaction causing severe rash involving mucus membranes or skin necrosis: No Has patient had a PCN reaction that required hospitalization No Has patient had a PCN reaction occurring within the last 10 years: No If all of the above answers are "NO", then may proceed with  Cephalosporin use.     Review of Systems  Constitutional: Negative.   Eyes:  Negative for blurred vision.  Respiratory: Negative.  Negative for shortness of breath.   Cardiovascular: Negative.  Negative for chest pain and palpitations.  Gastrointestinal: Negative.   Musculoskeletal:  Positive for back pain.       Has c/o LBP upon awakening in the morning. Radiates down left leg. Has been seen by Dr. Nelva Bush in the past. Denies LE weakness/numbness. Ambulatory with walker. Denies urinary/fecal incontinence.   Neurological:  Numbness: left leg at night.  Psychiatric/Behavioral: Negative.    All other systems reviewed and are negative.   Today's Vitals   06/03/21 1512  BP: 110/74  Pulse: 78  Temp: 98.8 F (37.1 C)  TempSrc: Oral  Weight: 163 lb (73.9 kg)  Height: 5' 3.6" (1.615 m)  PainSc: 0-No pain   Body mass index is 28.33 kg/m.  Wt Readings from Last 3 Encounters:  06/11/21 161 lb 9.6 oz (73.3 kg)  06/03/21 163 lb (73.9 kg)  04/22/21 161 lb 6.4 oz (73.2 kg)   BP Readings from Last 3 Encounters:  06/11/21 118/66  06/03/21 110/74  04/22/21 118/78   Objective:  Physical Exam Vitals and nursing note reviewed.  Constitutional:      Appearance: Normal appearance.  HENT:     Head: Normocephalic and atraumatic.     Nose:     Comments: Masked     Mouth/Throat:     Comments: Masked  Cardiovascular:     Rate and Rhythm: Normal rate and regular rhythm.     Heart sounds: Normal heart sounds.  Pulmonary:     Effort: Pulmonary effort is normal.     Breath sounds: Normal breath sounds.  Skin:    General: Skin is warm.  Neurological:     General: No focal deficit present.     Mental Status: She is alert.  Psychiatric:        Mood and Affect: Mood normal.        Behavior: Behavior normal.        Assessment And Plan:     1. Type 2 diabetes mellitus with stage 2 chronic kidney disease, without long-term current use of insulin (HCC) Comments: Chronic, I will check labs  as listed below. Importance of dietary compliance was discussed with the patient.  - Hemoglobin A1c - CMP14+EGFR  2. Benign hypertension with CKD (chronic kidney disease), stage II Comments: Chronic, well controlled. Encouraged to follow low sodium diet.   3. Lumbar radiculopathy Comments: Chronic, I will refer her back to Dr. Nelva Bush for further evaluation/treatment.  - Ambulatory referral to Pain Clinic  4. AAA (abdominal aortic aneurysm) without rupture (HCC) Comments: Chronic, followed by Vascular. Reminded to avoid lifting heavy objects.   5. Aortic atherosclerosis ([place]) Comments: She is encouraged to follow heart healthy lifestyle - free of fried/processed foods, increased exercise and statin compliance.   6. Drug therapy - Vitamin B12   Patient was given opportunity to ask questions. Patient verbalized understanding of the plan and was able to repeat key elements of the plan. All questions  were answered to their satisfaction.   I, Maximino Greenland, MD, have reviewed all documentation for this visit. The documentation on 06/13/21 for the exam, diagnosis, procedures, and orders are all accurate and complete.   IF YOU HAVE BEEN REFERRED TO A SPECIALIST, IT MAY TAKE 1-2 WEEKS TO SCHEDULE/PROCESS THE REFERRAL. IF YOU HAVE NOT HEARD FROM US/SPECIALIST IN TWO WEEKS, PLEASE GIVE [place] A CALL AT [phone].   THE PATIENT IS ENCOURAGED TO PRACTICE SOCIAL DISTANCING DUE TO THE COVID-19 PANDEMIC.

## 2021-06-03 NOTE — Patient Instructions (Signed)
Diabetes Mellitus and Foot Care Foot care is an important part of your health, especially when you have diabetes. Diabetes may cause you to have problems because of poor blood flow (circulation) to your feet and legs, which can cause your skin to:  Become thinner and drier.  Break more easily.  Heal more slowly.  Peel and crack. You may also have nerve damage (neuropathy) in your legs and feet, causing decreased feeling in them. This means that you may not notice minor injuries to your feet that could lead to more serious problems. Noticing and addressing any potential problems early is the best way to prevent future foot problems. How to care for your feet Foot hygiene  Wash your feet daily with warm water and mild soap. Do not use hot water. Then, pat your feet and the areas between your toes until they are completely dry. Do not soak your feet as this can dry your skin.  Trim your toenails straight across. Do not dig under them or around the cuticle. File the edges of your nails with an emery board or nail file.  Apply a moisturizing lotion or petroleum jelly to the skin on your feet and to dry, brittle toenails. Use lotion that does not contain alcohol and is unscented. Do not apply lotion between your toes.   Shoes and socks  Wear clean socks or stockings every day. Make sure they are not too tight. Do not wear knee-high stockings since they may decrease blood flow to your legs.  Wear shoes that fit properly and have enough cushioning. Always look in your shoes before you put them on to be sure there are no objects inside.  To break in new shoes, wear them for just a few hours a day. This prevents injuries on your feet. Wounds, scrapes, corns, and calluses  Check your feet daily for blisters, cuts, bruises, sores, and redness. If you cannot see the bottom of your feet, use a mirror or ask someone for help.  Do not cut corns or calluses or try to remove them with medicine.  If you  find a minor scrape, cut, or break in the skin on your feet, keep it and the skin around it clean and dry. You may clean these areas with mild soap and water. Do not clean the area with peroxide, alcohol, or iodine.  If you have a wound, scrape, corn, or callus on your foot, look at it several times a day to make sure it is healing and not infected. Check for: ? Redness, swelling, or pain. ? Fluid or blood. ? Warmth. ? Pus or a bad smell.   General tips  Do not cross your legs. This may decrease blood flow to your feet.  Do not use heating pads or hot water bottles on your feet. They may burn your skin. If you have lost feeling in your feet or legs, you may not know this is happening until it is too late.  Protect your feet from hot and cold by wearing shoes, such as at the beach or on hot pavement.  Schedule a complete foot exam at least once a year (annually) or more often if you have foot problems. Report any cuts, sores, or bruises to your health care provider immediately. Where to find more information  American Diabetes Association: www.diabetes.org  Association of Diabetes Care & Education Specialists: www.diabeteseducator.org Contact a health care provider if:  You have a medical condition that increases your risk of infection and   you have any cuts, sores, or bruises on your feet.  You have an injury that is not healing.  You have redness on your legs or feet.  You feel burning or tingling in your legs or feet.  You have pain or cramps in your legs and feet.  Your legs or feet are numb.  Your feet always feel cold.  You have pain around any toenails. Get help right away if:  You have a wound, scrape, corn, or callus on your foot and: ? You have pain, swelling, or redness that gets worse. ? You have fluid or blood coming from the wound, scrape, corn, or callus. ? Your wound, scrape, corn, or callus feels warm to the touch. ? You have pus or a bad smell coming from  the wound, scrape, corn, or callus. ? You have a fever. ? You have a red line going up your leg. Summary  Check your feet every day for blisters, cuts, bruises, sores, and redness.  Apply a moisturizing lotion or petroleum jelly to the skin on your feet and to dry, brittle toenails.  Wear shoes that fit properly and have enough cushioning.  If you have foot problems, report any cuts, sores, or bruises to your health care provider immediately.  Schedule a complete foot exam at least once a year (annually) or more often if you have foot problems. This information is not intended to replace advice given to you by your health care provider. Make sure you discuss any questions you have with your health care provider. Document Revised: 07/03/2020 Document Reviewed: 07/03/2020 Elsevier Patient Education  2021 [employer] Inc.  

## 2021-06-04 LAB — CMP14+EGFR
ALT: 8 IU/L (ref 0–32)
AST: 13 IU/L (ref 0–40)
Albumin/Globulin Ratio: 2 (ref 1.2–2.2)
Albumin: 4.2 g/dL (ref 3.5–4.6)
Alkaline Phosphatase: 43 IU/L — ABNORMAL LOW (ref 44–121)
BUN/Creatinine Ratio: 23 (ref 12–28)
BUN: 15 mg/dL (ref 10–36)
Bilirubin Total: 0.5 mg/dL (ref 0.0–1.2)
CO2: 42 mmol/L (ref 20–29)
Calcium: 9.8 mg/dL (ref 8.7–10.3)
Chloride: 103 mmol/L (ref 96–106)
Creatinine, Ser: 0.65 mg/dL (ref 0.57–1.00)
Globulin, Total: 2.1 g/dL (ref 1.5–4.5)
Glucose: 123 mg/dL — ABNORMAL HIGH (ref 65–99)
Potassium: 3.9 mmol/L (ref 3.5–5.2)
Sodium: 141 mmol/L (ref 134–144)
Total Protein: 6.3 g/dL (ref 6.0–8.5)
eGFR: 81 mL/min/{1.73_m2} (ref >59)

## 2021-06-04 LAB — VITAMIN B12: Vitamin B-12: 161 pg/mL — ABNORMAL LOW (ref 232–1245)

## 2021-06-04 LAB — HEMOGLOBIN A1C
Est. average glucose Bld gHb Est-mCnc: 128 mg/dL
Hgb A1c MFr Bld: 6.1 % — ABNORMAL HIGH (ref 4.8–5.6)

## 2021-06-11 ENCOUNTER — Other Ambulatory Visit: Payer: Self-pay

## 2021-06-11 ENCOUNTER — Ambulatory Visit (INDEPENDENT_AMBULATORY_CARE_PROVIDER_SITE_OTHER): Payer: Medicare Other | Admitting: Internal Medicine

## 2021-06-11 ENCOUNTER — Encounter: Payer: Self-pay | Admitting: Internal Medicine

## 2021-06-11 VITALS — BP 118/66 | HR 77 | Temp 98.1°F | Ht 63.6 in | Wt 161.6 lb

## 2021-06-11 DIAGNOSIS — E538 Deficiency of other specified B group vitamins: Secondary | ICD-10-CM

## 2021-06-11 DIAGNOSIS — R7981 Abnormal blood-gas level: Secondary | ICD-10-CM

## 2021-06-11 MED ORDER — CYANOCOBALAMIN 1000 MCG/ML IJ SOLN
1000.0000 ug | Freq: Once | INTRAMUSCULAR | Status: AC
  Administered 2021-06-11: 1000 ug via INTRAMUSCULAR

## 2021-06-11 NOTE — Progress Notes (Signed)
I,Katawbba Wiggins,acting as a Education administrator for Maximino Greenland, MD.,have documented all relevant documentation on the behalf of Maximino Greenland, MD,as directed by  Maximino Greenland, MD while in the presence of Maximino Greenland, MD.  This visit occurred during the SARS-CoV-2 public health emergency.  Safety protocols were in place, including screening questions prior to the visit, additional usage of staff PPE, and extensive cleaning of exam room while observing appropriate contact time as indicated for disinfecting solutions.  Subjective:     Patient ID: Molly Meyer , female    DOB: 27-Aug-1924 , 87 y.o.   MRN: [id number]   Chief Complaint  Patient presents with   B12 Injection    HPI  The patient is here today for her 1st of 4 wkly vitamin b12 injections.  At her last visit, she was found to have low vitamin B12 level, 161 (lower limit of normal is 232).  She is accompanied by her daughter.     Past Medical History:  Diagnosis Date   Degenerative disc disease, lumbar    Diabetes mellitus without complication (HCC)    Hypertension      Family History  Problem Relation Age of Onset   Hyperlipidemia Father      Current Outpatient Medications:    ACCU-CHEK SMARTVIEW test strip, CHECK BLOOD SUGARS ONCE BEFORE BREAKFAST DX: E11.65, Disp: 100 each, Rfl: 11   Accu-Chek Softclix Lancets lancets, Use as instructed to check blood sugars 1 time per day dx: e11.22, Disp: 150 each, Rfl: 2   acetaminophen (TYLENOL) 500 MG tablet, Take 2 tablets (1,000 mg total) by mouth every 6 (six) hours as needed for mild pain or moderate pain., Disp: 30 tablet, Rfl: 0   Atorvastatin Calcium (LIPITOR PO), , Disp: , Rfl:    azithromycin (ZITHROMAX) 250 MG tablet, Take 2 tabs day one, then 1 tab daily until complete - only start if symptoms worsen over next week or two, Disp: 6 tablet, Rfl: 0   Cholecalciferol (VITAMIN D3) 100000 UNIT/GM POWD, 1 capsule, Disp: , Rfl:    Cholecalciferol (VITAMIN D3) 2000 UNITS  capsule, Take 2,000 Units by mouth daily. 1 qd, Disp: , Rfl:    docusate sodium (COLACE) 100 MG capsule, 1 capsule as needed, Disp: , Rfl:    linaclotide (LINZESS) 72 MCG capsule, Take 1 capsule (72 mcg total) by mouth daily before breakfast., Disp: 30 capsule, Rfl: 5   LISINOPRIL PO, , Disp: , Rfl:    lisinopril-hydrochlorothiazide (ZESTORETIC) 10-12.5 MG tablet, TAKE 1 TABLET DAILY, Disp: 90 tablet, Rfl: 2   METFORMIN HCL PO, , Disp: , Rfl:    mirabegron ER (MYRBETRIQ) 25 MG TB24 tablet, 1 tablet, Disp: , Rfl:    simvastatin (ZOCOR) 20 MG tablet, , Disp: , Rfl:    simvastatin (ZOCOR) 40 MG tablet, TAKE 1 TABLET BY MOUTH EVERYDAY AT BEDTIME  Needs appointment for further refills., Disp: 30 tablet, Rfl: 0   Allergies  Allergen Reactions   Penicillin G     Other reaction(s): Unknown   Penicillins Rash    Thinks she may be able to take some forms now.Has patient had a PCN reaction causing immediate rash, facial/tongue/throat swelling, SOB or lightheadedness with hypotension: No Has patient had a PCN reaction causing severe rash involving mucus membranes or skin necrosis: No Has patient had a PCN reaction that required hospitalization No Has patient had a PCN reaction occurring within the last 10 years: No If all of the above answers are "NO", then  may proceed with Cephalosporin use.     Review of Systems  Constitutional: Negative.   Respiratory: Negative.    Cardiovascular: Negative.   Gastrointestinal: Negative.   Psychiatric/Behavioral: Negative.    All other systems reviewed and are negative.   Today's Vitals   06/11/21 1550  BP: 118/66  Pulse: 77  Temp: 98.1 F (36.7 C)  TempSrc: Oral  Weight: 161 lb 9.6 oz (73.3 kg)  Height: 5' 3.6" (1.615 m)  PainSc: 0-No pain   Body mass index is 28.09 kg/m.  Wt Readings from Last 3 Encounters:  06/30/21 161 lb 12.8 oz (73.4 kg)  06/23/21 160 lb 12.8 oz (72.9 kg)  06/16/21 161 lb 9.6 oz (73.3 kg)    BP Readings from Last 3  Encounters:  06/30/21 108/72  06/23/21 132/78  06/16/21 132/70    Objective:  Physical Exam Vitals and nursing note reviewed.  Constitutional:      Appearance: Normal appearance.  HENT:     Head: Normocephalic and atraumatic.     Nose:     Comments: Masked     Mouth/Throat:     Comments: Masked  Cardiovascular:     Rate and Rhythm: Normal rate and regular rhythm.     Heart sounds: Normal heart sounds.  Pulmonary:     Effort: Pulmonary effort is normal.     Breath sounds: Normal breath sounds.  Skin:    General: Skin is warm.  Neurological:     General: No focal deficit present.     Mental Status: She is alert.  Psychiatric:        Mood and Affect: Mood normal.        Behavior: Behavior normal.        Assessment And Plan:     1. Vitamin B12 deficiency Comments: She was given vitamin B12 IM x 1. She will rto weekly x 4, then monthly.  - cyanocobalamin ((VITAMIN B-12)) injection 1,000 mcg  2. Elevated CO2 level Comments: I went over her recent labwork in full detail. I will check repeat bmp and treat as indicated. All questions were answered to their satisfaction.  - BMP8+EGFR   Patient was given opportunity to ask questions. Patient verbalized understanding of the plan and was able to repeat key elements of the plan. All questions were answered to their satisfaction.   I, Maximino Greenland, MD, have reviewed all documentation for this visit. The documentation on 07/01/21 for the exam, diagnosis, procedures, and orders are all accurate and complete.   IF YOU HAVE BEEN REFERRED TO A SPECIALIST, IT MAY TAKE 1-2 WEEKS TO SCHEDULE/PROCESS THE REFERRAL. IF YOU HAVE NOT HEARD FROM US/SPECIALIST IN TWO WEEKS, PLEASE GIVE [place] A CALL AT [phone].   THE PATIENT IS ENCOURAGED TO PRACTICE SOCIAL DISTANCING DUE TO THE COVID-19 PANDEMIC.

## 2021-06-12 ENCOUNTER — Telehealth: Payer: Self-pay

## 2021-06-12 LAB — BMP8+EGFR
BUN/Creatinine Ratio: 20 (ref 12–28)
BUN: 16 mg/dL (ref 10–36)
CO2: 23 mmol/L (ref 20–29)
Calcium: 10 mg/dL (ref 8.7–10.3)
Chloride: 102 mmol/L (ref 96–106)
Creatinine, Ser: 0.81 mg/dL (ref 0.57–1.00)
Glucose: 94 mg/dL (ref 65–99)
Potassium: 4.1 mmol/L (ref 3.5–5.2)
Sodium: 141 mmol/L (ref 134–144)
eGFR: 67 mL/min/{1.73_m2} (ref >59)

## 2021-06-12 NOTE — Telephone Encounter (Signed)
-----   Message from [name], MD sent at 06/12/2021  6:25 AM EDT ----- Your CO2 level is now normal. Please notify pt and her daughter, Michaelle Copas.

## 2021-06-12 NOTE — Telephone Encounter (Signed)
The pt was notified of her lab results.  I left a message for the pt's daughter Mrs. Flannigan that I was calling with her mother's lab results on Mrs. Flannigan's voicemail.

## 2021-06-16 ENCOUNTER — Ambulatory Visit (INDEPENDENT_AMBULATORY_CARE_PROVIDER_SITE_OTHER): Payer: Medicare Other

## 2021-06-16 ENCOUNTER — Other Ambulatory Visit: Payer: Self-pay

## 2021-06-16 VITALS — BP 132/70 | HR 74 | Temp 97.8°F | Ht 63.0 in | Wt 161.6 lb

## 2021-06-16 DIAGNOSIS — E538 Deficiency of other specified B group vitamins: Secondary | ICD-10-CM | POA: Diagnosis not present

## 2021-06-16 MED ORDER — CYANOCOBALAMIN 1000 MCG/ML IJ SOLN
1000.0000 ug | Freq: Once | INTRAMUSCULAR | Status: AC
Start: 2021-06-16 — End: 2021-06-16
  Administered 2021-06-16: 1000 ug via INTRAMUSCULAR

## 2021-06-16 NOTE — Progress Notes (Signed)
Pt is here for b12 injection. 

## 2021-06-19 ENCOUNTER — Other Ambulatory Visit: Payer: Self-pay | Admitting: Cardiology

## 2021-06-19 NOTE — Telephone Encounter (Signed)
Refill sent to pharmacy.   Needs appointment for further refills.  

## 2021-06-22 ENCOUNTER — Other Ambulatory Visit: Payer: Self-pay

## 2021-06-22 ENCOUNTER — Encounter: Payer: Self-pay | Admitting: Podiatry

## 2021-06-22 ENCOUNTER — Ambulatory Visit (INDEPENDENT_AMBULATORY_CARE_PROVIDER_SITE_OTHER): Payer: Medicare Other | Admitting: Podiatry

## 2021-06-22 DIAGNOSIS — B351 Tinea unguium: Secondary | ICD-10-CM

## 2021-06-22 DIAGNOSIS — M79674 Pain in right toe(s): Secondary | ICD-10-CM

## 2021-06-22 DIAGNOSIS — M79675 Pain in left toe(s): Secondary | ICD-10-CM | POA: Diagnosis not present

## 2021-06-23 ENCOUNTER — Ambulatory Visit (INDEPENDENT_AMBULATORY_CARE_PROVIDER_SITE_OTHER): Payer: Medicare Other

## 2021-06-23 VITALS — BP 132/78 | HR 78 | Temp 98.1°F | Ht 63.0 in | Wt 160.8 lb

## 2021-06-23 DIAGNOSIS — E538 Deficiency of other specified B group vitamins: Secondary | ICD-10-CM

## 2021-06-23 MED ORDER — CYANOCOBALAMIN 1000 MCG/ML IJ SOLN
1000.0000 ug | Freq: Once | INTRAMUSCULAR | Status: AC
  Administered 2021-06-23: 1000 ug via INTRAMUSCULAR

## 2021-06-23 NOTE — Progress Notes (Signed)
Pt is here for b12 injection. 

## 2021-06-24 NOTE — Progress Notes (Signed)
  Subjective:  Patient ID: Molly Meyer, female    DOB: [birth year],  MRN: [id number]  87 y.o. female presents at risk foot care with history of diabetic neuropathy and painful thick toenails that are difficult to trim. Pain interferes with ambulation. Aggravating factors include wearing enclosed shoe gear. Pain is relieved with periodic professional debridement.  She relates no new pedal problems on today's visit.  Patient does not monitor blood glucose daily.  PCP is [name], MD , and last visit was 06/03/2021.  Allergies  Allergen Reactions   Penicillin G     Other reaction(s): Unknown   Penicillins Rash    Thinks she may be able to take some forms now.Has patient had a PCN reaction causing immediate rash, facial/tongue/throat swelling, SOB or lightheadedness with hypotension: No Has patient had a PCN reaction causing severe rash involving mucus membranes or skin necrosis: No Has patient had a PCN reaction that required hospitalization No Has patient had a PCN reaction occurring within the last 10 years: No If all of the above answers are "NO", then may proceed with Cephalosporin use.    Review of Systems: Negative except as noted in the HPI.   Objective:  Neurovascular Examination: Vascular status intact b/l with palpable pedal pulses.  Pedal hair sparse b/l. No edema. No pain with calf compression b/l. Skin temperature gradient WNL b/l.   Protective sensation diminished with 10 gram monofilament b/l.  Dermatological Examination: Pedal skin with normal turgor, texture and tone b/l.  Toenails 1-5 b/l thick, discolored, elongated with subungual debris and pain on dorsal palpation.  No hyperkeratotic lesions noted b/l.  Musculoskeletal Examination: Muscle strength 5/5 to b/l LE. HAV with bunion deformity b/l. Hammertoes 1-5 b/l. Pes planus deformity b/l. Utilizes walker for ambulation assitance.  Radiographs: None Assessment:   1. Pain due to onychomycosis of  toenails of both feet    Plan:   -Examined patient. -No new findings. No new orders. -Continue diabetic foot care principles. -Patient to continue soft, supportive shoe gear daily. -Toenails 1-5 b/l were debrided in length and girth with sterile nail nippers and dremel without iatrogenic bleeding.  -Patient to report any pedal injuries to medical professional immediately. -Patient/POA to call should there be question/concern in the interim.  Return in about 3 months (around 09/22/2021).  Marzetta Board, DPM

## 2021-06-30 ENCOUNTER — Other Ambulatory Visit: Payer: Self-pay

## 2021-06-30 ENCOUNTER — Ambulatory Visit (INDEPENDENT_AMBULATORY_CARE_PROVIDER_SITE_OTHER): Payer: Medicare Other

## 2021-06-30 VITALS — BP 108/72 | HR 85 | Temp 97.8°F | Ht 63.0 in | Wt 161.8 lb

## 2021-06-30 DIAGNOSIS — E538 Deficiency of other specified B group vitamins: Secondary | ICD-10-CM

## 2021-06-30 MED ORDER — CYANOCOBALAMIN 1000 MCG/ML IJ SOLN
1000.0000 ug | Freq: Once | INTRAMUSCULAR | Status: AC
  Administered 2021-06-30: 1000 ug via INTRAMUSCULAR

## 2021-06-30 NOTE — Progress Notes (Signed)
Pt is here for b12 injection. 

## 2021-07-01 ENCOUNTER — Encounter: Payer: Self-pay | Admitting: Internal Medicine

## 2021-07-02 DIAGNOSIS — M545 Low back pain, unspecified: Secondary | ICD-10-CM | POA: Diagnosis not present

## 2021-07-02 DIAGNOSIS — R2689 Other abnormalities of gait and mobility: Secondary | ICD-10-CM | POA: Diagnosis not present

## 2021-07-07 DIAGNOSIS — N182 Chronic kidney disease, stage 2 (mild): Secondary | ICD-10-CM | POA: Diagnosis not present

## 2021-07-07 DIAGNOSIS — Z9181 History of falling: Secondary | ICD-10-CM | POA: Diagnosis not present

## 2021-07-07 DIAGNOSIS — E1142 Type 2 diabetes mellitus with diabetic polyneuropathy: Secondary | ICD-10-CM | POA: Diagnosis not present

## 2021-07-07 DIAGNOSIS — Z9682 Presence of neurostimulator: Secondary | ICD-10-CM | POA: Diagnosis not present

## 2021-07-07 DIAGNOSIS — E1122 Type 2 diabetes mellitus with diabetic chronic kidney disease: Secondary | ICD-10-CM | POA: Diagnosis not present

## 2021-07-07 DIAGNOSIS — M5116 Intervertebral disc disorders with radiculopathy, lumbar region: Secondary | ICD-10-CM | POA: Diagnosis not present

## 2021-07-10 DIAGNOSIS — N182 Chronic kidney disease, stage 2 (mild): Secondary | ICD-10-CM | POA: Diagnosis not present

## 2021-07-10 DIAGNOSIS — Z9181 History of falling: Secondary | ICD-10-CM | POA: Diagnosis not present

## 2021-07-10 DIAGNOSIS — Z9682 Presence of neurostimulator: Secondary | ICD-10-CM | POA: Diagnosis not present

## 2021-07-10 DIAGNOSIS — E1142 Type 2 diabetes mellitus with diabetic polyneuropathy: Secondary | ICD-10-CM | POA: Diagnosis not present

## 2021-07-10 DIAGNOSIS — E1122 Type 2 diabetes mellitus with diabetic chronic kidney disease: Secondary | ICD-10-CM | POA: Diagnosis not present

## 2021-07-10 DIAGNOSIS — M5116 Intervertebral disc disorders with radiculopathy, lumbar region: Secondary | ICD-10-CM | POA: Diagnosis not present

## 2021-07-14 DIAGNOSIS — Z9181 History of falling: Secondary | ICD-10-CM | POA: Diagnosis not present

## 2021-07-14 DIAGNOSIS — Z9682 Presence of neurostimulator: Secondary | ICD-10-CM | POA: Diagnosis not present

## 2021-07-14 DIAGNOSIS — E1142 Type 2 diabetes mellitus with diabetic polyneuropathy: Secondary | ICD-10-CM | POA: Diagnosis not present

## 2021-07-14 DIAGNOSIS — M5116 Intervertebral disc disorders with radiculopathy, lumbar region: Secondary | ICD-10-CM | POA: Diagnosis not present

## 2021-07-14 DIAGNOSIS — E1122 Type 2 diabetes mellitus with diabetic chronic kidney disease: Secondary | ICD-10-CM | POA: Diagnosis not present

## 2021-07-14 DIAGNOSIS — N182 Chronic kidney disease, stage 2 (mild): Secondary | ICD-10-CM | POA: Diagnosis not present

## 2021-07-15 DIAGNOSIS — Z9682 Presence of neurostimulator: Secondary | ICD-10-CM | POA: Diagnosis not present

## 2021-07-15 DIAGNOSIS — Z9181 History of falling: Secondary | ICD-10-CM | POA: Diagnosis not present

## 2021-07-15 DIAGNOSIS — N182 Chronic kidney disease, stage 2 (mild): Secondary | ICD-10-CM | POA: Diagnosis not present

## 2021-07-15 DIAGNOSIS — E1122 Type 2 diabetes mellitus with diabetic chronic kidney disease: Secondary | ICD-10-CM | POA: Diagnosis not present

## 2021-07-15 DIAGNOSIS — M5116 Intervertebral disc disorders with radiculopathy, lumbar region: Secondary | ICD-10-CM | POA: Diagnosis not present

## 2021-07-15 DIAGNOSIS — E1142 Type 2 diabetes mellitus with diabetic polyneuropathy: Secondary | ICD-10-CM | POA: Diagnosis not present

## 2021-07-20 DIAGNOSIS — Z9181 History of falling: Secondary | ICD-10-CM | POA: Diagnosis not present

## 2021-07-20 DIAGNOSIS — Z9682 Presence of neurostimulator: Secondary | ICD-10-CM | POA: Diagnosis not present

## 2021-07-20 DIAGNOSIS — M5116 Intervertebral disc disorders with radiculopathy, lumbar region: Secondary | ICD-10-CM | POA: Diagnosis not present

## 2021-07-20 DIAGNOSIS — E1122 Type 2 diabetes mellitus with diabetic chronic kidney disease: Secondary | ICD-10-CM | POA: Diagnosis not present

## 2021-07-20 DIAGNOSIS — E1142 Type 2 diabetes mellitus with diabetic polyneuropathy: Secondary | ICD-10-CM | POA: Diagnosis not present

## 2021-07-20 DIAGNOSIS — N182 Chronic kidney disease, stage 2 (mild): Secondary | ICD-10-CM | POA: Diagnosis not present

## 2021-07-22 DIAGNOSIS — M5116 Intervertebral disc disorders with radiculopathy, lumbar region: Secondary | ICD-10-CM | POA: Diagnosis not present

## 2021-07-22 DIAGNOSIS — Z9682 Presence of neurostimulator: Secondary | ICD-10-CM | POA: Diagnosis not present

## 2021-07-22 DIAGNOSIS — E1122 Type 2 diabetes mellitus with diabetic chronic kidney disease: Secondary | ICD-10-CM | POA: Diagnosis not present

## 2021-07-22 DIAGNOSIS — E1142 Type 2 diabetes mellitus with diabetic polyneuropathy: Secondary | ICD-10-CM | POA: Diagnosis not present

## 2021-07-22 DIAGNOSIS — N182 Chronic kidney disease, stage 2 (mild): Secondary | ICD-10-CM | POA: Diagnosis not present

## 2021-07-22 DIAGNOSIS — Z9181 History of falling: Secondary | ICD-10-CM | POA: Diagnosis not present

## 2021-07-27 ENCOUNTER — Other Ambulatory Visit: Payer: Self-pay | Admitting: Cardiology

## 2021-07-27 DIAGNOSIS — Z9682 Presence of neurostimulator: Secondary | ICD-10-CM | POA: Diagnosis not present

## 2021-07-27 DIAGNOSIS — E1142 Type 2 diabetes mellitus with diabetic polyneuropathy: Secondary | ICD-10-CM | POA: Diagnosis not present

## 2021-07-27 DIAGNOSIS — N182 Chronic kidney disease, stage 2 (mild): Secondary | ICD-10-CM | POA: Diagnosis not present

## 2021-07-27 DIAGNOSIS — M5116 Intervertebral disc disorders with radiculopathy, lumbar region: Secondary | ICD-10-CM | POA: Diagnosis not present

## 2021-07-27 DIAGNOSIS — E1122 Type 2 diabetes mellitus with diabetic chronic kidney disease: Secondary | ICD-10-CM | POA: Diagnosis not present

## 2021-07-27 DIAGNOSIS — Z9181 History of falling: Secondary | ICD-10-CM | POA: Diagnosis not present

## 2021-08-03 DIAGNOSIS — M5116 Intervertebral disc disorders with radiculopathy, lumbar region: Secondary | ICD-10-CM | POA: Diagnosis not present

## 2021-08-03 DIAGNOSIS — Z9181 History of falling: Secondary | ICD-10-CM | POA: Diagnosis not present

## 2021-08-03 DIAGNOSIS — N182 Chronic kidney disease, stage 2 (mild): Secondary | ICD-10-CM | POA: Diagnosis not present

## 2021-08-03 DIAGNOSIS — Z9682 Presence of neurostimulator: Secondary | ICD-10-CM | POA: Diagnosis not present

## 2021-08-03 DIAGNOSIS — E1142 Type 2 diabetes mellitus with diabetic polyneuropathy: Secondary | ICD-10-CM | POA: Diagnosis not present

## 2021-08-03 DIAGNOSIS — E1122 Type 2 diabetes mellitus with diabetic chronic kidney disease: Secondary | ICD-10-CM | POA: Diagnosis not present

## 2021-08-05 DIAGNOSIS — N182 Chronic kidney disease, stage 2 (mild): Secondary | ICD-10-CM | POA: Diagnosis not present

## 2021-08-05 DIAGNOSIS — E1142 Type 2 diabetes mellitus with diabetic polyneuropathy: Secondary | ICD-10-CM | POA: Diagnosis not present

## 2021-08-05 DIAGNOSIS — Z9682 Presence of neurostimulator: Secondary | ICD-10-CM | POA: Diagnosis not present

## 2021-08-05 DIAGNOSIS — E1122 Type 2 diabetes mellitus with diabetic chronic kidney disease: Secondary | ICD-10-CM | POA: Diagnosis not present

## 2021-08-05 DIAGNOSIS — Z9181 History of falling: Secondary | ICD-10-CM | POA: Diagnosis not present

## 2021-08-05 DIAGNOSIS — M5116 Intervertebral disc disorders with radiculopathy, lumbar region: Secondary | ICD-10-CM | POA: Diagnosis not present

## 2021-08-06 DIAGNOSIS — N182 Chronic kidney disease, stage 2 (mild): Secondary | ICD-10-CM | POA: Diagnosis not present

## 2021-08-06 DIAGNOSIS — E119 Type 2 diabetes mellitus without complications: Secondary | ICD-10-CM | POA: Insufficient documentation

## 2021-08-06 DIAGNOSIS — Z9682 Presence of neurostimulator: Secondary | ICD-10-CM | POA: Diagnosis not present

## 2021-08-06 DIAGNOSIS — M5116 Intervertebral disc disorders with radiculopathy, lumbar region: Secondary | ICD-10-CM | POA: Diagnosis not present

## 2021-08-06 DIAGNOSIS — M5136 Other intervertebral disc degeneration, lumbar region: Secondary | ICD-10-CM | POA: Insufficient documentation

## 2021-08-06 DIAGNOSIS — Z9181 History of falling: Secondary | ICD-10-CM | POA: Diagnosis not present

## 2021-08-06 DIAGNOSIS — E1142 Type 2 diabetes mellitus with diabetic polyneuropathy: Secondary | ICD-10-CM | POA: Diagnosis not present

## 2021-08-06 DIAGNOSIS — E1122 Type 2 diabetes mellitus with diabetic chronic kidney disease: Secondary | ICD-10-CM | POA: Diagnosis not present

## 2021-08-06 DIAGNOSIS — M51369 Other intervertebral disc degeneration, lumbar region without mention of lumbar back pain or lower extremity pain: Secondary | ICD-10-CM | POA: Insufficient documentation

## 2021-08-06 DIAGNOSIS — I10 Essential (primary) hypertension: Secondary | ICD-10-CM | POA: Insufficient documentation

## 2021-08-07 ENCOUNTER — Ambulatory Visit (INDEPENDENT_AMBULATORY_CARE_PROVIDER_SITE_OTHER): Payer: Medicare Other | Admitting: Cardiology

## 2021-08-07 ENCOUNTER — Encounter: Payer: Self-pay | Admitting: Cardiology

## 2021-08-07 ENCOUNTER — Other Ambulatory Visit: Payer: Self-pay

## 2021-08-07 ENCOUNTER — Other Ambulatory Visit: Payer: Self-pay | Admitting: Cardiology

## 2021-08-07 VITALS — BP 128/62 | HR 76 | Ht 67.0 in | Wt 160.0 lb

## 2021-08-07 DIAGNOSIS — N182 Chronic kidney disease, stage 2 (mild): Secondary | ICD-10-CM | POA: Diagnosis not present

## 2021-08-07 DIAGNOSIS — E785 Hyperlipidemia, unspecified: Secondary | ICD-10-CM | POA: Diagnosis not present

## 2021-08-07 DIAGNOSIS — I714 Abdominal aortic aneurysm, without rupture, unspecified: Secondary | ICD-10-CM

## 2021-08-07 DIAGNOSIS — I129 Hypertensive chronic kidney disease with stage 1 through stage 4 chronic kidney disease, or unspecified chronic kidney disease: Secondary | ICD-10-CM | POA: Diagnosis not present

## 2021-08-07 MED ORDER — SIMVASTATIN 40 MG PO TABS: 40.0000 mg | ORAL_TABLET | Freq: Every day | ORAL | 3 refills | Status: DC

## 2021-08-07 NOTE — Patient Instructions (Signed)

## 2021-08-07 NOTE — Progress Notes (Signed)
Cardiology Office Note:    Date:  08/07/2021   ID:  Molly Meyer, DOB 1924/01/16, MRN [id number]  PCP:  [name], MD  Cardiologist:  Jenne Campus, MD    Referring MD: [name], MD   Chief Complaint  Patient presents with   Medication Refill  I am doing fine, needs refill on my medications  History of Present Illness:    Molly Meyer is a 87 y.o. female who is absolutely delightful.  She does have history of abdominal arctic aneurysm measuring 5.6 cm, essential hypertension, diabetes, dyslipidemia.  She comes today to my office with her daughter like always for follow-up.  She is doing very well she complain of having some leg issues but otherwise doing well she is like always cheerful happy and talks a lot.  Denies having any abdominal pain no TIA/CVA-like symptoms overall doing well  Past Medical History:  Diagnosis Date   Degenerative disc disease, lumbar    Diabetes mellitus without complication ([place])    Hypertension     Past Surgical History:  Procedure Laterality Date   KNEE SURGERY      Current Medications: Current Meds  Medication Sig   Cholecalciferol (VITAMIN D3) 2000 UNITS capsule Take 2,000 Units by mouth daily. 1 qd   linaclotide (LINZESS) 72 MCG capsule Take 1 capsule (72 mcg total) by mouth daily before breakfast.   lisinopril-hydrochlorothiazide (ZESTORETIC) 10-12.5 MG tablet TAKE 1 TABLET DAILY (Patient taking differently: Take 1 tablet by mouth daily.)   simvastatin (ZOCOR) 40 MG tablet TAKE 1 TABLET DAILY. PATIENT NEEDS AN APPOINTMENT FOR FURTHER REFILLS. FINAL ATTEMPT (Patient taking differently: Take 40 mg by mouth daily at 6 PM.)   VITAMIN E PO Take 1 tablet by mouth daily.     Allergies:   Penicillin g and Penicillins   Social History   Socioeconomic History   Marital status: Widowed    Spouse name: Not on file   Number of children: Not on file   Years of education: Not on file   Highest education level: Not on file   Occupational History   Occupation: retired  Tobacco Use   Smoking status: Never   Smokeless tobacco: Never  Vaping Use   Vaping Use: Never used  Substance and Sexual Activity   Alcohol use: No    Alcohol/week: 0.0 standard drinks   Drug use: Never   Sexual activity: Not Currently  Other Topics Concern   Not on file  Social History Narrative   Not on file   Social Determinants of Health   Financial Resource Strain: Low Risk    Difficulty of Paying Living Expenses: Not hard at all  Food Insecurity: No Food Insecurity   Worried About Charity fundraiser in the Last Year: Never true   [place] in the Last Year: Never true  Transportation Needs: No Transportation Needs   Lack of Transportation (Medical): No   Lack of Transportation (Non-Medical): No  Physical Activity: Inactive   Days of Exercise per Week: 0 days   Minutes of Exercise per Session: 0 min  Stress: No Stress Concern Present   Feeling of Stress : Not at all  Social Connections: Not on file     Family History: The patient's family history includes Hyperlipidemia in her father. ROS:   Please see the history of present illness.    All 14 point review of systems negative except as described per history of present illness  EKGs/Labs/Other Studies Reviewed:  Recent Labs: 12/03/2020: Hemoglobin 14.0; Platelets 179 06/03/2021: ALT 8 06/11/2021: BUN 16; Creatinine, Ser 0.81; Potassium 4.1; Sodium 141  Recent Lipid Panel    Component Value Date/Time   CHOL 156 12/03/2020 1644   TRIG 97 12/03/2020 1644   HDL 53 12/03/2020 1644   CHOLHDL 2.9 12/03/2020 1644   LDLCALC 85 12/03/2020 1644    Physical Exam:    VS:  BP 128/62 (BP Location: Right Arm, Patient Position: Sitting)   Pulse 76   Ht '5\' 7"'$  (1.702 m)   Wt 160 lb (72.6 kg)   SpO2 95%   BMI 25.06 kg/m     Wt Readings from Last 3 Encounters:  08/07/21 160 lb (72.6 kg)  06/30/21 161 lb 12.8 oz (73.4 kg)  06/23/21 160 lb 12.8 oz (72.9 kg)      GEN:  Well nourished, well developed in no acute distress HEENT: Normal NECK: No JVD; No carotid bruits LYMPHATICS: No lymphadenopathy CARDIAC: RRR, no murmurs, no rubs, no gallops RESPIRATORY:  Clear to auscultation without rales, wheezing or rhonchi  ABDOMEN: Soft, non-tender, non-distended MUSCULOSKELETAL:  No edema; No deformity  SKIN: Warm and dry LOWER EXTREMITIES: no swelling NEUROLOGIC:  Alert and oriented x 3 PSYCHIATRIC:  Normal affect   ASSESSMENT:    1. AAA (abdominal aortic aneurysm) without rupture ([place])   2. Benign hypertension with CKD (chronic kidney disease), stage II   3. Dyslipidemia    PLAN:    In order of problems listed above:  Abdominal aortic aneurysm measuring 5.6 cm she told me straight she does not want any intervention on it.  We will simply continue monitoring.  Therefore, there is no point of repeating ultrasound since she told me that she absolutely does not want to have any intervention.  Her daughter is present during our conversation today and both of them on the same page. Essential hypertension blood pressure well controlled continue present management. Dyslipidemia she is on simvastatin with LDL of 85 HDL 53 this is from [facility] from 03 December 2020.  She wants a refill on the medication which we will do however I told him that benefits of cholesterol medication in someone her age is very problematic.  He still willing to continue.   Medication Adjustments/Labs and Tests Ordered: Current medicines are reviewed at length with the patient today.  Concerns regarding medicines are outlined above.  No orders of the defined types were placed in this encounter.  Medication changes: No orders of the defined types were placed in this encounter.   Signed, Park Liter, MD, [facility] 08/07/2021 2:37 PM    [place]

## 2021-08-10 DIAGNOSIS — M5116 Intervertebral disc disorders with radiculopathy, lumbar region: Secondary | ICD-10-CM | POA: Diagnosis not present

## 2021-08-10 DIAGNOSIS — E1142 Type 2 diabetes mellitus with diabetic polyneuropathy: Secondary | ICD-10-CM | POA: Diagnosis not present

## 2021-08-10 DIAGNOSIS — E1122 Type 2 diabetes mellitus with diabetic chronic kidney disease: Secondary | ICD-10-CM | POA: Diagnosis not present

## 2021-08-10 DIAGNOSIS — Z9181 History of falling: Secondary | ICD-10-CM | POA: Diagnosis not present

## 2021-08-10 DIAGNOSIS — N182 Chronic kidney disease, stage 2 (mild): Secondary | ICD-10-CM | POA: Diagnosis not present

## 2021-08-10 DIAGNOSIS — Z9682 Presence of neurostimulator: Secondary | ICD-10-CM | POA: Diagnosis not present

## 2021-08-12 DIAGNOSIS — Z9181 History of falling: Secondary | ICD-10-CM | POA: Diagnosis not present

## 2021-08-12 DIAGNOSIS — N182 Chronic kidney disease, stage 2 (mild): Secondary | ICD-10-CM | POA: Diagnosis not present

## 2021-08-12 DIAGNOSIS — E1122 Type 2 diabetes mellitus with diabetic chronic kidney disease: Secondary | ICD-10-CM | POA: Diagnosis not present

## 2021-08-12 DIAGNOSIS — Z9682 Presence of neurostimulator: Secondary | ICD-10-CM | POA: Diagnosis not present

## 2021-08-12 DIAGNOSIS — E1142 Type 2 diabetes mellitus with diabetic polyneuropathy: Secondary | ICD-10-CM | POA: Diagnosis not present

## 2021-08-12 DIAGNOSIS — M5116 Intervertebral disc disorders with radiculopathy, lumbar region: Secondary | ICD-10-CM | POA: Diagnosis not present

## 2021-08-17 DIAGNOSIS — E1122 Type 2 diabetes mellitus with diabetic chronic kidney disease: Secondary | ICD-10-CM | POA: Diagnosis not present

## 2021-08-17 DIAGNOSIS — N182 Chronic kidney disease, stage 2 (mild): Secondary | ICD-10-CM | POA: Diagnosis not present

## 2021-08-17 DIAGNOSIS — Z9682 Presence of neurostimulator: Secondary | ICD-10-CM | POA: Diagnosis not present

## 2021-08-17 DIAGNOSIS — Z9181 History of falling: Secondary | ICD-10-CM | POA: Diagnosis not present

## 2021-08-17 DIAGNOSIS — E1142 Type 2 diabetes mellitus with diabetic polyneuropathy: Secondary | ICD-10-CM | POA: Diagnosis not present

## 2021-08-17 DIAGNOSIS — M5116 Intervertebral disc disorders with radiculopathy, lumbar region: Secondary | ICD-10-CM | POA: Diagnosis not present

## 2021-08-19 DIAGNOSIS — E1142 Type 2 diabetes mellitus with diabetic polyneuropathy: Secondary | ICD-10-CM | POA: Diagnosis not present

## 2021-08-19 DIAGNOSIS — Z9181 History of falling: Secondary | ICD-10-CM | POA: Diagnosis not present

## 2021-08-19 DIAGNOSIS — N182 Chronic kidney disease, stage 2 (mild): Secondary | ICD-10-CM | POA: Diagnosis not present

## 2021-08-19 DIAGNOSIS — M5116 Intervertebral disc disorders with radiculopathy, lumbar region: Secondary | ICD-10-CM | POA: Diagnosis not present

## 2021-08-19 DIAGNOSIS — Z9682 Presence of neurostimulator: Secondary | ICD-10-CM | POA: Diagnosis not present

## 2021-08-19 DIAGNOSIS — E1122 Type 2 diabetes mellitus with diabetic chronic kidney disease: Secondary | ICD-10-CM | POA: Diagnosis not present

## 2021-08-21 DIAGNOSIS — R2689 Other abnormalities of gait and mobility: Secondary | ICD-10-CM | POA: Diagnosis not present

## 2021-08-21 DIAGNOSIS — M5416 Radiculopathy, lumbar region: Secondary | ICD-10-CM | POA: Insufficient documentation

## 2021-08-21 DIAGNOSIS — R269 Unspecified abnormalities of gait and mobility: Secondary | ICD-10-CM | POA: Insufficient documentation

## 2021-08-21 DIAGNOSIS — Z9189 Other specified personal risk factors, not elsewhere classified: Secondary | ICD-10-CM | POA: Diagnosis not present

## 2021-08-24 DIAGNOSIS — E1142 Type 2 diabetes mellitus with diabetic polyneuropathy: Secondary | ICD-10-CM | POA: Diagnosis not present

## 2021-08-24 DIAGNOSIS — N182 Chronic kidney disease, stage 2 (mild): Secondary | ICD-10-CM | POA: Diagnosis not present

## 2021-08-24 DIAGNOSIS — Z9181 History of falling: Secondary | ICD-10-CM | POA: Diagnosis not present

## 2021-08-24 DIAGNOSIS — E1122 Type 2 diabetes mellitus with diabetic chronic kidney disease: Secondary | ICD-10-CM | POA: Diagnosis not present

## 2021-08-24 DIAGNOSIS — M5116 Intervertebral disc disorders with radiculopathy, lumbar region: Secondary | ICD-10-CM | POA: Diagnosis not present

## 2021-08-24 DIAGNOSIS — Z9682 Presence of neurostimulator: Secondary | ICD-10-CM | POA: Diagnosis not present

## 2021-08-26 ENCOUNTER — Encounter: Payer: Self-pay | Admitting: Internal Medicine

## 2021-08-26 ENCOUNTER — Ambulatory Visit (INDEPENDENT_AMBULATORY_CARE_PROVIDER_SITE_OTHER): Payer: Medicare Other | Admitting: Internal Medicine

## 2021-08-26 ENCOUNTER — Other Ambulatory Visit: Payer: Self-pay

## 2021-08-26 VITALS — BP 116/68 | HR 82 | Temp 98.6°F | Ht 67.0 in | Wt 159.8 lb

## 2021-08-26 DIAGNOSIS — Z6825 Body mass index (BMI) 25.0-25.9, adult: Secondary | ICD-10-CM | POA: Diagnosis not present

## 2021-08-26 DIAGNOSIS — R2689 Other abnormalities of gait and mobility: Secondary | ICD-10-CM | POA: Diagnosis not present

## 2021-08-26 DIAGNOSIS — R0602 Shortness of breath: Secondary | ICD-10-CM | POA: Diagnosis not present

## 2021-08-26 DIAGNOSIS — E538 Deficiency of other specified B group vitamins: Secondary | ICD-10-CM

## 2021-08-26 MED ORDER — CYANOCOBALAMIN 1000 MCG/ML IJ SOLN: 1000.0000 ug | Freq: Once | INTRAMUSCULAR | Status: DC

## 2021-08-26 MED ORDER — CYANOCOBALAMIN 1000 MCG/ML IJ SOLN
1000.0000 ug | Freq: Once | INTRAMUSCULAR | Status: AC
  Administered 2021-08-26: 1000 ug via INTRAMUSCULAR

## 2021-08-26 MED ORDER — LISINOPRIL-HYDROCHLOROTHIAZIDE 10-12.5 MG PO TABS
1.0000 | ORAL_TABLET | Freq: Every day | ORAL | 2 refills | Status: DC
Start: 2021-08-26 — End: 2022-09-22

## 2021-08-26 NOTE — Progress Notes (Signed)
I,Yamilka [employer] as a Education administrator for Maximino Greenland, MD.,have documented all relevant documentation on the behalf of Maximino Greenland, MD,as directed by  Maximino Greenland, MD while in the presence of Maximino Greenland, MD.  This visit occurred during the SARS-CoV-2 public health emergency.  Safety protocols were in place, including screening questions prior to the visit, additional usage of staff PPE, and extensive cleaning of exam room while observing appropriate contact time as indicated for disinfecting solutions.  Subjective:     Patient ID: Molly Meyer , female    DOB: [birth year] , 87 y.o.   MRN: [id number]   Chief Complaint  Patient presents with   B12 Injection    HPI  Patient presents today for her first Vitamin B12 injection monthly injection. She is accompanied by her daughter today.     Past Medical History:  Diagnosis Date   Degenerative disc disease, lumbar    Diabetes mellitus without complication (HCC)    Hypertension      Family History  Problem Relation Age of Onset   Hyperlipidemia Father      Current Outpatient Medications:    simvastatin (ZOCOR) 40 MG tablet, Take 1 tablet (40 mg total) by mouth daily at 6 PM., Disp: 90 tablet, Rfl: 3   linaclotide (LINZESS) 72 MCG capsule, Take 1 capsule (72 mcg total) by mouth daily before breakfast. (Patient not taking: Reported on 08/26/2021), Disp: 30 capsule, Rfl: 5   lisinopril-hydrochlorothiazide (ZESTORETIC) 10-12.5 MG tablet, Take 1 tablet by mouth daily. (Patient not taking: Reported on 08/26/2021), Disp: 90 tablet, Rfl: 2   Allergies  Allergen Reactions   Penicillin G     Other reaction(s): Unknown   Penicillins Rash    Thinks she may be able to take some forms now.Has patient had a PCN reaction causing immediate rash, facial/tongue/throat swelling, SOB or lightheadedness with hypotension: No Has patient had a PCN reaction causing severe rash involving mucus membranes or skin necrosis: No Has patient  had a PCN reaction that required hospitalization No Has patient had a PCN reaction occurring within the last 10 years: No If all of the above answers are "NO", then may proceed with Cephalosporin use.     Review of Systems  Constitutional: Negative.   Respiratory:  Positive for shortness of breath.   Cardiovascular: Negative.   Gastrointestinal: Negative.   Neurological: Negative.   Psychiatric/Behavioral: Negative.      Today's Vitals   08/26/21 1537  BP: 116/68  Pulse: 82  Temp: 98.6 F (37 C)  Weight: 159 lb 12.8 oz (72.5 kg)  Height: '5\' 7"'$  (1.702 m)   Body mass index is 25.03 kg/m.  Wt Readings from Last 3 Encounters:  08/26/21 159 lb 12.8 oz (72.5 kg)  08/07/21 160 lb (72.6 kg)  06/30/21 161 lb 12.8 oz (73.4 kg)     Objective:  Physical Exam Vitals and nursing note reviewed.  Constitutional:      Appearance: Normal appearance.  HENT:     Head: Normocephalic and atraumatic.     Nose:     Comments: Masked     Mouth/Throat:     Comments: Masked  Cardiovascular:     Rate and Rhythm: Normal rate and regular rhythm.     Heart sounds: Normal heart sounds.  Pulmonary:     Effort: Pulmonary effort is normal.     Breath sounds: Normal breath sounds.  Musculoskeletal:     Cervical back: Normal range of motion.  Comments: Ambulatory w/ walker  Skin:    General: Skin is warm.  Neurological:     General: No focal deficit present.     Mental Status: She is alert.  Psychiatric:        Mood and Affect: Mood normal.        Behavior: Behavior normal.        Assessment And Plan:     1. Vitamin B12 deficiency Comments: She was given vitamin B12 IM x 1.  She will rto in 4 weeks for her next injection.  - cyanocobalamin ((VITAMIN B-12)) injection 1,000 mcg  2. Imbalance Comments: She agrees to c/w PT. She has not felt improvement since starting Vit b12 injections.   3. Shortness of breath Comments: I will check CBC today. She denies seeing blood in stool and  gums.If normal, will consider Cardiology evaluation.  - CBC no Diff  4. BMI 25.0-25.9,adult Comments: Her BMI is acceptable for her demographics. She is encouraged to perform chair exercises while watching TV.    Patient was given opportunity to ask questions. Patient verbalized understanding of the plan and was able to repeat key elements of the plan. All questions were answered to their satisfaction.   I, Maximino Greenland, MD, have reviewed all documentation for this visit. The documentation on 09/06/21 for the exam, diagnosis, procedures, and orders are all accurate and complete.   IF YOU HAVE BEEN REFERRED TO A SPECIALIST, IT MAY TAKE 1-2 WEEKS TO SCHEDULE/PROCESS THE REFERRAL. IF YOU HAVE NOT HEARD FROM US/SPECIALIST IN TWO WEEKS, PLEASE GIVE [place] A CALL AT [phone] X [phone].   THE PATIENT IS ENCOURAGED TO PRACTICE SOCIAL DISTANCING DUE TO THE COVID-19 PANDEMIC.

## 2021-08-26 NOTE — Patient Instructions (Signed)
Vitamin B12 Deficiency Vitamin B12 deficiency means that your body does not have enough vitamin B12. The body needs this vitamin: To make red blood cells. To make genes (DNA). To help the nerves work. If you do not have enough vitamin B12 in your body, you can have health problems. What are the causes? Not eating enough foods that contain vitamin B12. Not being able to absorb vitamin B12 from the food that you eat. Certain digestive system diseases. A condition in which the body does not make enough of a certain protein, which results in too few red blood cells (pernicious anemia). Having a surgery in which part of the stomach or small intestine is removed. Taking medicines that make it hard for the body to absorb vitamin B12. These medicines include: Heartburn medicines. Some antibiotic medicines. Other medicines that are used to treat certain conditions. What increases the risk? Being older than age 50. Eating a vegetarian or vegan diet, especially while you are pregnant. Eating a poor diet while you are pregnant. Taking certain medicines. Having alcoholism. What are the signs or symptoms? In some cases, there are no symptoms. If the condition leads to too few blood cells or nerve damage, symptoms can occur, such as: Feeling weak. Feeling tired (fatigued). Not being hungry. Weight loss. A loss of feeling (numbness) or tingling in your hands and feet. Redness and burning of the tongue. Being mixed up (confused) or having memory problems. Sadness (depression). Problems with your senses. This can include color blindness, ringing in the ears, or loss of taste. Watery poop (diarrhea) or trouble pooping (constipation). Trouble walking. If anemia is very bad, symptoms can include: Being short of breath. Being dizzy. Having a very fast heartbeat. How is this treated? Changing the way you eat and drink, such as: Eating more foods that contain vitamin B12. Drinking little or no  alcohol. Getting vitamin B12 shots. Taking vitamin B12 supplements. Your doctor will tell you the dose that is best for you. Follow these instructions at home: Eating and drinking  Eat lots of healthy foods that contain vitamin B12. These include: Meats and poultry, such as beef, pork, chicken, turkey, and organ meats, such as liver. Seafood, such as clams, rainbow trout, salmon, tuna, and haddock. Eggs. Cereal and dairy products that have vitamin B12 added to them. Check the label. The items listed above may not be a complete list of what you can eat and drink. Contact a dietitian for more options. General instructions Get any shots as told by your doctor. Take supplements only as told by your doctor. Do not drink alcohol if your doctor tells you not to. In some cases, you may only be asked to limit alcohol use. Keep all follow-up visits as told by your doctor. This is important. Contact a doctor if: Your symptoms come back. Get help right away if: You have trouble breathing. You have a very fast heartbeat. You have chest pain. You get dizzy. You pass out. Summary Vitamin B12 deficiency means that your body is not getting enough vitamin B12. In some cases, there are no symptoms of this condition. Treatment may include making a change in the way you eat and drink, getting vitamin B12 shots, or taking supplements. Eat lots of healthy foods that contain vitamin B12. This information is not intended to replace advice given to you by your health care provider. Make sure you discuss any questions you have with your health care provider. Document Revised: 08/22/2018 Document Reviewed: 08/22/2018 Elsevier Patient Education    2022 Elsevier Inc.  

## 2021-08-27 LAB — CBC
Hematocrit: 40.8 % (ref 34.0–46.6)
Hemoglobin: 14 g/dL (ref 11.1–15.9)
MCH: 29.9 pg (ref 26.6–33.0)
MCHC: 34.3 g/dL (ref 31.5–35.7)
MCV: 87 fL (ref 79–97)
Platelets: 179 10*3/uL (ref 150–450)
RBC: 4.68 x10E6/uL (ref 3.77–5.28)
RDW: 12.6 % (ref 11.7–15.4)
WBC: 5.5 10*3/uL (ref 3.4–10.8)

## 2021-09-03 DIAGNOSIS — M5116 Intervertebral disc disorders with radiculopathy, lumbar region: Secondary | ICD-10-CM | POA: Diagnosis not present

## 2021-09-03 DIAGNOSIS — Z9682 Presence of neurostimulator: Secondary | ICD-10-CM | POA: Diagnosis not present

## 2021-09-03 DIAGNOSIS — N182 Chronic kidney disease, stage 2 (mild): Secondary | ICD-10-CM | POA: Diagnosis not present

## 2021-09-03 DIAGNOSIS — Z9181 History of falling: Secondary | ICD-10-CM | POA: Diagnosis not present

## 2021-09-03 DIAGNOSIS — E1142 Type 2 diabetes mellitus with diabetic polyneuropathy: Secondary | ICD-10-CM | POA: Diagnosis not present

## 2021-09-03 DIAGNOSIS — E1122 Type 2 diabetes mellitus with diabetic chronic kidney disease: Secondary | ICD-10-CM | POA: Diagnosis not present

## 2021-09-05 DIAGNOSIS — E1142 Type 2 diabetes mellitus with diabetic polyneuropathy: Secondary | ICD-10-CM | POA: Diagnosis not present

## 2021-09-05 DIAGNOSIS — Z9682 Presence of neurostimulator: Secondary | ICD-10-CM | POA: Diagnosis not present

## 2021-09-05 DIAGNOSIS — N182 Chronic kidney disease, stage 2 (mild): Secondary | ICD-10-CM | POA: Diagnosis not present

## 2021-09-05 DIAGNOSIS — E1122 Type 2 diabetes mellitus with diabetic chronic kidney disease: Secondary | ICD-10-CM | POA: Diagnosis not present

## 2021-09-05 DIAGNOSIS — M5116 Intervertebral disc disorders with radiculopathy, lumbar region: Secondary | ICD-10-CM | POA: Diagnosis not present

## 2021-09-14 DIAGNOSIS — N182 Chronic kidney disease, stage 2 (mild): Secondary | ICD-10-CM | POA: Diagnosis not present

## 2021-09-14 DIAGNOSIS — Z9682 Presence of neurostimulator: Secondary | ICD-10-CM | POA: Diagnosis not present

## 2021-09-14 DIAGNOSIS — E1122 Type 2 diabetes mellitus with diabetic chronic kidney disease: Secondary | ICD-10-CM | POA: Diagnosis not present

## 2021-09-14 DIAGNOSIS — E1142 Type 2 diabetes mellitus with diabetic polyneuropathy: Secondary | ICD-10-CM | POA: Diagnosis not present

## 2021-09-14 DIAGNOSIS — M5116 Intervertebral disc disorders with radiculopathy, lumbar region: Secondary | ICD-10-CM | POA: Diagnosis not present

## 2021-09-21 DIAGNOSIS — M5116 Intervertebral disc disorders with radiculopathy, lumbar region: Secondary | ICD-10-CM | POA: Diagnosis not present

## 2021-09-21 DIAGNOSIS — Z9682 Presence of neurostimulator: Secondary | ICD-10-CM | POA: Diagnosis not present

## 2021-09-21 DIAGNOSIS — E1122 Type 2 diabetes mellitus with diabetic chronic kidney disease: Secondary | ICD-10-CM | POA: Diagnosis not present

## 2021-09-21 DIAGNOSIS — E1142 Type 2 diabetes mellitus with diabetic polyneuropathy: Secondary | ICD-10-CM | POA: Diagnosis not present

## 2021-09-21 DIAGNOSIS — N182 Chronic kidney disease, stage 2 (mild): Secondary | ICD-10-CM | POA: Diagnosis not present

## 2021-09-22 ENCOUNTER — Ambulatory Visit: Payer: Medicare Other

## 2021-09-25 ENCOUNTER — Ambulatory Visit: Payer: Medicare Other | Admitting: Podiatry

## 2021-09-28 ENCOUNTER — Ambulatory Visit (INDEPENDENT_AMBULATORY_CARE_PROVIDER_SITE_OTHER): Payer: Medicare Other | Admitting: Podiatry

## 2021-09-28 ENCOUNTER — Other Ambulatory Visit: Payer: Self-pay

## 2021-09-28 DIAGNOSIS — B351 Tinea unguium: Secondary | ICD-10-CM

## 2021-09-28 DIAGNOSIS — M79675 Pain in left toe(s): Secondary | ICD-10-CM | POA: Diagnosis not present

## 2021-09-28 DIAGNOSIS — E1142 Type 2 diabetes mellitus with diabetic polyneuropathy: Secondary | ICD-10-CM

## 2021-09-28 DIAGNOSIS — N182 Chronic kidney disease, stage 2 (mild): Secondary | ICD-10-CM | POA: Diagnosis not present

## 2021-09-28 DIAGNOSIS — M79674 Pain in right toe(s): Secondary | ICD-10-CM

## 2021-09-28 DIAGNOSIS — E1122 Type 2 diabetes mellitus with diabetic chronic kidney disease: Secondary | ICD-10-CM | POA: Diagnosis not present

## 2021-09-28 DIAGNOSIS — M5116 Intervertebral disc disorders with radiculopathy, lumbar region: Secondary | ICD-10-CM | POA: Diagnosis not present

## 2021-09-28 DIAGNOSIS — Z9682 Presence of neurostimulator: Secondary | ICD-10-CM | POA: Diagnosis not present

## 2021-10-01 ENCOUNTER — Encounter: Payer: Self-pay | Admitting: Podiatry

## 2021-10-01 NOTE — Progress Notes (Signed)
  Subjective:  Patient ID: Molly Meyer, female    DOB: 1924/08/04,  MRN: [id number]  Molly Meyer presents to clinic today for painful thick toenails that are difficult to trim. Pain interferes with ambulation. Aggravating factors include wearing enclosed shoe gear. Pain is relieved with periodic professional debridement.  PCP is [name], MD , and last visit was 08/26/2021.  She voices no new pedal concerns on today's visit..  Allergies  Allergen Reactions   Penicillin G     Other reaction(s): Unknown   Penicillins Rash    Thinks she may be able to take some forms now.Has patient had a PCN reaction causing immediate rash, facial/tongue/throat swelling, SOB or lightheadedness with hypotension: No Has patient had a PCN reaction causing severe rash involving mucus membranes or skin necrosis: No Has patient had a PCN reaction that required hospitalization No Has patient had a PCN reaction occurring within the last 10 years: No If all of the above answers are "NO", then may proceed with Cephalosporin use.    Review of Systems: Negative except as noted in the HPI. Objective:   Constitutional Molly Meyer is a pleasant 87 y.o. Caucasian female, WD, WN in NAD. AAO x 3.   Vascular Capillary refill time to digits immediate b/l. Palpable pedal pulses b/l LE. Pedal hair sparse. Lower extremity skin temperature gradient within normal limits. No pain with calf compression b/l. No cyanosis or clubbing noted.  Neurologic Normal speech. Oriented to person, place, and time. Protective sensation diminished with 10g monofilament b/l.  Dermatologic Skin warm and supple b/l lower extremities. No open wounds b/l lower extremities. No interdigital macerations b/l lower extremities. Toenails 1-5 b/l elongated, discolored, dystrophic, thickened, crumbly with subungual debris and tenderness to dorsal palpation. No hyperkeratotic nor porokeratotic lesions present on today's visit.  Orthopedic:  Normal muscle strength 5/5 to all lower extremity muscle groups bilaterally. Hallux valgus with bunion deformity noted b/l lower extremities. Hammertoe(s) noted to the 1-5 bilaterally. Pes planus deformity noted b/l lower extremities.   Radiographs: None Assessment:   1. Pain due to onychomycosis of toenails of both feet   2. Diabetic peripheral neuropathy associated with type 2 diabetes mellitus ([place])    Plan:  Patient was evaluated and treated and all questions answered. Consent given for treatment as described below: -No new findings. No new orders. -Patient to continue soft, supportive shoe gear daily. -Toenails 1-5 bilaterally debrided in length and girth without iatrogenic bleeding with sterile nail nipper and dremel.  -Patient to report any pedal injuries to medical professional immediately. -Patient/POA to call should there be question/concern in the interim.  Return in about 3 months (around 12/29/2021).  Marzetta Board, DPM

## 2021-10-02 DIAGNOSIS — M5116 Intervertebral disc disorders with radiculopathy, lumbar region: Secondary | ICD-10-CM | POA: Diagnosis not present

## 2021-10-02 DIAGNOSIS — E1142 Type 2 diabetes mellitus with diabetic polyneuropathy: Secondary | ICD-10-CM | POA: Diagnosis not present

## 2021-10-02 DIAGNOSIS — E1122 Type 2 diabetes mellitus with diabetic chronic kidney disease: Secondary | ICD-10-CM | POA: Diagnosis not present

## 2021-10-02 DIAGNOSIS — Z9682 Presence of neurostimulator: Secondary | ICD-10-CM | POA: Diagnosis not present

## 2021-10-02 DIAGNOSIS — N182 Chronic kidney disease, stage 2 (mild): Secondary | ICD-10-CM | POA: Diagnosis not present

## 2021-10-04 DIAGNOSIS — Z23 Encounter for immunization: Secondary | ICD-10-CM | POA: Diagnosis not present

## 2021-10-08 ENCOUNTER — Other Ambulatory Visit: Payer: Self-pay

## 2021-10-08 DIAGNOSIS — H6123 Impacted cerumen, bilateral: Secondary | ICD-10-CM

## 2021-10-08 DIAGNOSIS — H905 Unspecified sensorineural hearing loss: Secondary | ICD-10-CM | POA: Diagnosis not present

## 2021-10-22 ENCOUNTER — Other Ambulatory Visit: Payer: Self-pay

## 2021-10-22 ENCOUNTER — Encounter: Payer: Self-pay | Admitting: Internal Medicine

## 2021-10-22 ENCOUNTER — Ambulatory Visit (INDEPENDENT_AMBULATORY_CARE_PROVIDER_SITE_OTHER): Payer: Medicare Other | Admitting: Internal Medicine

## 2021-10-22 VITALS — BP 130/72 | HR 78 | Temp 98.3°F | Ht 67.0 in | Wt 160.0 lb

## 2021-10-22 DIAGNOSIS — N182 Chronic kidney disease, stage 2 (mild): Secondary | ICD-10-CM

## 2021-10-22 DIAGNOSIS — Z23 Encounter for immunization: Secondary | ICD-10-CM | POA: Diagnosis not present

## 2021-10-22 DIAGNOSIS — E538 Deficiency of other specified B group vitamins: Secondary | ICD-10-CM

## 2021-10-22 DIAGNOSIS — E1122 Type 2 diabetes mellitus with diabetic chronic kidney disease: Secondary | ICD-10-CM | POA: Diagnosis not present

## 2021-10-22 DIAGNOSIS — I129 Hypertensive chronic kidney disease with stage 1 through stage 4 chronic kidney disease, or unspecified chronic kidney disease: Secondary | ICD-10-CM

## 2021-10-22 MED ORDER — TETANUS-DIPHTH-ACELL PERTUSSIS 5-2.5-18.5 LF-MCG/0.5 IM SUSP: 0.5000 mL | Freq: Once | INTRAMUSCULAR | 0 refills | Status: AC

## 2021-10-22 MED ORDER — CYANOCOBALAMIN 1000 MCG/ML IJ SOLN
1000.0000 ug | Freq: Once | INTRAMUSCULAR | Status: AC
  Administered 2021-10-22: 1000 ug via INTRAMUSCULAR

## 2021-10-22 NOTE — Progress Notes (Signed)
I,Tianna Badgett,acting as a Education administrator for Maximino Greenland, MD.,have documented all relevant documentation on the behalf of Maximino Greenland, MD,as directed by  Maximino Greenland, MD while in the presence of Maximino Greenland, MD.  This visit occurred during the SARS-CoV-2 public health emergency.  Safety protocols were in place, including screening questions prior to the visit, additional usage of staff PPE, and extensive cleaning of exam room while observing appropriate contact time as indicated for disinfecting solutions.  Subjective:     Patient ID: Molly Meyer , female    DOB: [birth year] , 87 y.o.   MRN: [id number]   Chief Complaint  Patient presents with   Diabetes   Hypertension    HPI  She presents today for DM/HTN check. She reports compliance with meds. Denies headaches, chest pain and shortness of breath. She is unaccompanied today. She denies having any specific concerns or complaints at this time.   Diabetes She presents for her follow-up diabetic visit. She has type 2 diabetes mellitus. Her disease course has been stable. There are no hypoglycemic associated symptoms. Pertinent negatives for diabetes include no blurred vision and no chest pain. There are no hypoglycemic complications. Diabetic complications include nephropathy. Risk factors for coronary artery disease include diabetes mellitus, dyslipidemia, hypertension, post-menopausal and sedentary lifestyle. Her weight is stable. Her breakfast blood glucose is taken between 8-9 am. Her breakfast blood glucose range is generally 90-110 mg/dl. An ACE inhibitor/angiotensin II receptor blocker is being taken. She sees a podiatrist.Eye exam is current.  Hypertension This is a chronic problem. The current episode started more than 1 year ago. The problem has been gradually improving since onset. The problem is controlled. Pertinent negatives include no blurred vision, chest pain, palpitations or shortness of breath.    Past Medical  History:  Diagnosis Date   Degenerative disc disease, lumbar    Diabetes mellitus without complication (HCC)    Hypertension      Family History  Problem Relation Age of Onset   Hyperlipidemia Father      Current Outpatient Medications:    Acetaminophen (TYLENOL PO), Tylenol, Disp: , Rfl:    acetaminophen (TYLENOL) 500 MG tablet, acetaminophen 500 mg tablet   1000 mg by oral route., Disp: , Rfl:    linaclotide (LINZESS) 72 MCG capsule, Take 1 capsule (72 mcg total) by mouth daily before breakfast., Disp: 30 capsule, Rfl: 5   lisinopril-hydrochlorothiazide (ZESTORETIC) 10-12.5 MG tablet, Take 1 tablet by mouth daily., Disp: 90 tablet, Rfl: 2   simvastatin (ZOCOR) 40 MG tablet, Take 1 tablet (40 mg total) by mouth daily at 6 PM., Disp: 90 tablet, Rfl: 3   Tdap (BOOSTRIX) 5-2.5-18.5 LF-MCG/0.5 injection, Inject 0.5 mLs into the muscle once for 1 dose., Disp: 0.5 mL, Rfl: 0   Allergies  Allergen Reactions   Penicillin G     Other reaction(s): Unknown   Penicillins Rash    Thinks she may be able to take some forms now.Has patient had a PCN reaction causing immediate rash, facial/tongue/throat swelling, SOB or lightheadedness with hypotension: No Has patient had a PCN reaction causing severe rash involving mucus membranes or skin necrosis: No Has patient had a PCN reaction that required hospitalization No Has patient had a PCN reaction occurring within the last 10 years: No If all of the above answers are "NO", then may proceed with Cephalosporin use.     Review of Systems  Constitutional: Negative.   Eyes:  Negative for blurred vision.  Respiratory: Negative.  Negative for shortness of breath.   Cardiovascular: Negative.  Negative for chest pain and palpitations.  Gastrointestinal: Negative.   Neurological: Negative.     Today's Vitals   10/22/21 1620  BP: 130/72  Pulse: 78  Temp: 98.3 F (36.8 C)  TempSrc: Oral  Weight: 160 lb (72.6 kg)  Height: $Remove'5\' 7"'AXQuetM$  (1.702 m)   Body  mass index is 25.06 kg/m.  Wt Readings from Last 3 Encounters:  10/22/21 160 lb (72.6 kg)  08/26/21 159 lb 12.8 oz (72.5 kg)  08/07/21 160 lb (72.6 kg)    Objective:  Physical Exam Vitals and nursing note reviewed.  Constitutional:      Appearance: Normal appearance.  HENT:     Head: Normocephalic and atraumatic.     Nose:     Comments: Masked     Mouth/Throat:     Comments: Masked  Cardiovascular:     Rate and Rhythm: Normal rate and regular rhythm.     Pulses:          Dorsalis pedis pulses are 1+ on the right side and 2+ on the left side.     Heart sounds: Normal heart sounds.  Pulmonary:     Effort: Pulmonary effort is normal.     Breath sounds: Normal breath sounds.  Musculoskeletal:     Cervical back: Normal range of motion.  Feet:     Right foot:     Protective Sensation: 5 sites tested.  5 sites sensed.     Skin integrity: Dry skin present.     Toenail Condition: Right toenails are abnormally thick.     Left foot:     Protective Sensation: 5 sites tested.  5 sites sensed.     Skin integrity: Dry skin present.     Toenail Condition: Left toenails are abnormally thick.  Skin:    General: Skin is warm.  Neurological:     General: No focal deficit present.     Mental Status: She is alert.  Psychiatric:        Mood and Affect: Mood normal.        Behavior: Behavior normal.        Assessment And Plan:     1. Type 2 diabetes mellitus with stage 2 chronic kidney disease, without long-term current use of insulin (HCC) Comments: Diabetic foot exam was performed. I will check labs as listed below. No med changes at this time. She will f/u in 4 months for re-evaluation.  - CMP14+EGFR - Hemoglobin A1c  2. Benign hypertension with CKD (chronic kidney disease), stage II Comments: Chronic, controlled. No med changes. Encouraged to follow low sodium diet.   3. Vitamin B12 deficiency Comments: I will check vitamin B12 level. She was also given vitamin B12 IM x 1.   For now, she will f/u in 2 months for re-evaluation.  - Vitamin B12 - cyanocobalamin ((VITAMIN B-12)) injection 1,000 mcg  4. Immunization due Comments: I will send Boostrix to her local pharmacy.    Patient was given opportunity to ask questions. Patient verbalized understanding of the plan and was able to repeat key elements of the plan. All questions were answered to their satisfaction.   I, Maximino Greenland, MD, have reviewed all documentation for this visit. The documentation on 10/22/21 for the exam, diagnosis, procedures, and orders are all accurate and complete.   IF YOU HAVE BEEN REFERRED TO A SPECIALIST, IT MAY TAKE 1-2 WEEKS TO SCHEDULE/PROCESS THE REFERRAL. IF YOU HAVE NOT HEARD  FROM US/SPECIALIST IN TWO WEEKS, PLEASE GIVE [place] A CALL AT [phone] X [phone].   THE PATIENT IS ENCOURAGED TO PRACTICE SOCIAL DISTANCING DUE TO THE COVID-19 PANDEMIC.

## 2021-10-22 NOTE — Patient Instructions (Signed)

## 2021-10-23 LAB — CMP14+EGFR
ALT: 13 IU/L (ref 0–32)
AST: 18 IU/L (ref 0–40)
Albumin/Globulin Ratio: 2.1 (ref 1.2–2.2)
Albumin: 4.4 g/dL (ref 3.5–4.6)
Alkaline Phosphatase: 43 IU/L — ABNORMAL LOW (ref 44–121)
BUN/Creatinine Ratio: 19 (ref 12–28)
BUN: 14 mg/dL (ref 10–36)
Bilirubin Total: 0.5 mg/dL (ref 0.0–1.2)
CO2: 25 mmol/L (ref 20–29)
Calcium: 9.6 mg/dL (ref 8.7–10.3)
Chloride: 103 mmol/L (ref 96–106)
Creatinine, Ser: 0.74 mg/dL (ref 0.57–1.00)
Globulin, Total: 2.1 g/dL (ref 1.5–4.5)
Glucose: 90 mg/dL (ref 70–99)
Potassium: 4.1 mmol/L (ref 3.5–5.2)
Sodium: 142 mmol/L (ref 134–144)
Total Protein: 6.5 g/dL (ref 6.0–8.5)
eGFR: 74 mL/min/{1.73_m2} (ref >59)

## 2021-10-23 LAB — HEMOGLOBIN A1C
Est. average glucose Bld gHb Est-mCnc: 123 mg/dL
Hgb A1c MFr Bld: 5.9 % — ABNORMAL HIGH (ref 4.8–5.6)

## 2021-10-23 LAB — VITAMIN B12: Vitamin B-12: 321 pg/mL (ref 232–1245)

## 2021-11-02 ENCOUNTER — Telehealth: Payer: Self-pay

## 2021-11-02 NOTE — Telephone Encounter (Signed)
The patient stated that she will ask if she can get a b12 injection at the next visit

## 2021-12-03 ENCOUNTER — Ambulatory Visit: Payer: Medicare Other

## 2021-12-03 ENCOUNTER — Ambulatory Visit (INDEPENDENT_AMBULATORY_CARE_PROVIDER_SITE_OTHER): Payer: Medicare Other | Admitting: Internal Medicine

## 2021-12-03 ENCOUNTER — Encounter: Payer: Self-pay | Admitting: Internal Medicine

## 2021-12-03 ENCOUNTER — Other Ambulatory Visit: Payer: Self-pay

## 2021-12-03 VITALS — BP 116/82 | HR 57 | Temp 98.2°F | Ht 67.0 in | Wt 159.2 lb

## 2021-12-03 DIAGNOSIS — I129 Hypertensive chronic kidney disease with stage 1 through stage 4 chronic kidney disease, or unspecified chronic kidney disease: Secondary | ICD-10-CM

## 2021-12-03 DIAGNOSIS — E1122 Type 2 diabetes mellitus with diabetic chronic kidney disease: Secondary | ICD-10-CM

## 2021-12-03 DIAGNOSIS — N182 Chronic kidney disease, stage 2 (mild): Secondary | ICD-10-CM | POA: Diagnosis not present

## 2021-12-03 DIAGNOSIS — E538 Deficiency of other specified B group vitamins: Secondary | ICD-10-CM | POA: Diagnosis not present

## 2021-12-03 DIAGNOSIS — Z23 Encounter for immunization: Secondary | ICD-10-CM

## 2021-12-03 NOTE — Progress Notes (Signed)
I,Katawbba Wiggins,acting as a Education administrator for Maximino Greenland, MD.,have documented all relevant documentation on the behalf of Maximino Greenland, MD,as directed by  Maximino Greenland, MD while in the presence of Maximino Greenland, MD.  This visit occurred during the SARS-CoV-2 public health emergency.  Safety protocols were in place, including screening questions prior to the visit, additional usage of staff PPE, and extensive cleaning of exam room while observing appropriate contact time as indicated for disinfecting solutions.  Subjective:     Patient ID: Molly Meyer , female    DOB: [birth year] , 87 y.o.   MRN: [id number]   Chief Complaint  Patient presents with   Diabetes   Hypertension    HPI  She presents today for DM/HTN check. She reports compliance with meds. She is alone today. Denies headaches, chest pain and shortness of breath.   Diabetes She presents for her follow-up diabetic visit. She has type 2 diabetes mellitus. Her disease course has been stable. There are no hypoglycemic associated symptoms. Pertinent negatives for diabetes include no blurred vision and no chest pain. There are no hypoglycemic complications. Diabetic complications include nephropathy. Risk factors for coronary artery disease include diabetes mellitus, dyslipidemia, hypertension, post-menopausal and sedentary lifestyle. Her weight is stable. Her breakfast blood glucose is taken between 8-9 am. Her breakfast blood glucose range is generally 90-110 mg/dl. An ACE inhibitor/angiotensin II receptor blocker is being taken. She sees a podiatrist.Eye exam is current.  Hypertension This is a chronic problem. The current episode started more than 1 year ago. The problem has been gradually improving since onset. The problem is controlled. Pertinent negatives include no blurred vision, chest pain, palpitations or shortness of breath.    Past Medical History:  Diagnosis Date   Degenerative disc disease, lumbar    Diabetes  mellitus without complication (HCC)    Hypertension      Family History  Problem Relation Age of Onset   Hyperlipidemia Father      Current Outpatient Medications:    Acetaminophen (TYLENOL PO), Tylenol, Disp: , Rfl:    acetaminophen (TYLENOL) 500 MG tablet, acetaminophen 500 mg tablet   1000 mg by oral route., Disp: , Rfl:    linaclotide (LINZESS) 72 MCG capsule, Take 1 capsule (72 mcg total) by mouth daily before breakfast., Disp: 30 capsule, Rfl: 5   lisinopril-hydrochlorothiazide (ZESTORETIC) 10-12.5 MG tablet, Take 1 tablet by mouth daily., Disp: 90 tablet, Rfl: 2   simvastatin (ZOCOR) 40 MG tablet, Take 1 tablet (40 mg total) by mouth daily at 6 PM., Disp: 90 tablet, Rfl: 3   Allergies  Allergen Reactions   Penicillin G     Other reaction(s): Unknown   Penicillins Rash    Thinks she may be able to take some forms now.Has patient had a PCN reaction causing immediate rash, facial/tongue/throat swelling, SOB or lightheadedness with hypotension: No Has patient had a PCN reaction causing severe rash involving mucus membranes or skin necrosis: No Has patient had a PCN reaction that required hospitalization No Has patient had a PCN reaction occurring within the last 10 years: No If all of the above answers are "NO", then may proceed with Cephalosporin use.     Review of Systems  Constitutional: Negative.   Eyes:  Negative for blurred vision.  Respiratory: Negative.  Negative for shortness of breath.   Cardiovascular: Negative.  Negative for chest pain and palpitations.  Neurological: Negative.   Psychiatric/Behavioral: Negative.      Today's Vitals  12/03/21 1501  BP: 116/82  Pulse: (!) 57  Temp: 98.2 F (36.8 C)  Weight: 159 lb 3.2 oz (72.2 kg)  Height: 5\' 7"  (1.702 m)  PainSc: 0-No pain   Body mass index is 24.93 kg/m.  Wt Readings from Last 3 Encounters:  12/10/21 159 lb (72.1 kg)  12/03/21 159 lb 3.2 oz (72.2 kg)  10/22/21 160 lb (72.6 kg)    BP Readings from  Last 3 Encounters:  12/03/21 116/82  10/22/21 130/72  08/26/21 116/68    Objective:  Physical Exam Vitals and nursing note reviewed.  Constitutional:      Appearance: Normal appearance.  HENT:     Head: Normocephalic and atraumatic.     Nose:     Comments: Masked     Mouth/Throat:     Comments: Masked  Eyes:     Extraocular Movements: Extraocular movements intact.  Cardiovascular:     Rate and Rhythm: Normal rate and regular rhythm.     Heart sounds: Normal heart sounds.  Pulmonary:     Effort: Pulmonary effort is normal.     Breath sounds: Normal breath sounds.  Musculoskeletal:     Cervical back: Normal range of motion.  Skin:    General: Skin is warm.  Neurological:     General: No focal deficit present.     Mental Status: She is alert.  Psychiatric:        Mood and Affect: Mood normal.        Behavior: Behavior normal.        Assessment And Plan:     1. Type 2 diabetes mellitus with stage 2 chronic kidney disease, without long-term current use of insulin ([place]) Comments: Chronic, last a1c 5.9 in Oct 2022. Too early to recheck labs today. She is encouraged to follow her regular dietary regimen and to perform chair exercises daily  2. Benign hypertension with CKD (chronic kidney disease), stage II Comments: Chronic, well controlled. Encouraged to follow sodium diet.  No med changes today.   3. Vitamin B12 deficiency Comments: I will check vitamin B12 level today. I will then determine frequency of vitamin B12 injections needed.  - Vitamin B12  Patient was given opportunity to ask questions. Patient verbalized understanding of the plan and was able to repeat key elements of the plan. All questions were answered to their satisfaction.   I, Maximino Greenland, MD, have reviewed all documentation for this visit. The documentation on 12/28/21 for the exam, diagnosis, procedures, and orders are all accurate and complete.   IF YOU HAVE BEEN REFERRED TO A SPECIALIST, IT MAY  TAKE 1-2 WEEKS TO SCHEDULE/PROCESS THE REFERRAL. IF YOU HAVE NOT HEARD FROM US/SPECIALIST IN TWO WEEKS, PLEASE GIVE [place] A CALL AT [phone].   THE PATIENT IS ENCOURAGED TO PRACTICE SOCIAL DISTANCING DUE TO THE COVID-19 PANDEMIC.

## 2021-12-03 NOTE — Patient Instructions (Signed)

## 2021-12-10 ENCOUNTER — Ambulatory Visit (INDEPENDENT_AMBULATORY_CARE_PROVIDER_SITE_OTHER): Payer: Medicare Other

## 2021-12-10 VITALS — Ht 67.0 in | Wt 159.0 lb

## 2021-12-10 DIAGNOSIS — Z0000 Encounter for general adult medical examination without abnormal findings: Secondary | ICD-10-CM | POA: Diagnosis not present

## 2021-12-10 NOTE — Progress Notes (Signed)
I connected with Molly Meyer today by telephone and verified that I am speaking with the correct person using two identifiers. Location patient: home Location provider: work Persons participating in the virtual visit: Lorretta Kerce, Glenna Durand LPN.   I discussed the limitations, risks, security and privacy concerns of performing an evaluation and management service by telephone and the availability of in person appointments. I also discussed with the patient that there may be a patient responsible charge related to this service. The patient expressed understanding and verbally consented to this telephonic visit.    Interactive audio and video telecommunications were attempted between this provider and patient, however failed, due to patient having technical difficulties OR patient did not have access to video capability.  We continued and completed visit with audio only.     Vital signs may be patient reported or missing.  Subjective:   Molly Meyer is a 87 y.o. female who presents for Medicare Annual (Subsequent) preventive examination.  Review of Systems     Cardiac Risk Factors include: advanced age (>53men, >38 women);diabetes mellitus;hypertension;dyslipidemia     Objective:    Today's Vitals   12/10/21 1356  Weight: 159 lb (72.1 kg)  Height: 5\' 7"  (1.702 m)   Body mass index is 24.9 kg/m.  Advanced Directives 12/10/2021 12/03/2020 11/14/2019 06/05/2019 09/28/2018 09/12/2018 04/14/2016  Does Patient Have a Medical Advance Directive? Yes Yes Yes Yes Yes Yes Yes  Type of Paramedic of [place];Living will [place];Living will [place];Living will [place];Living will [place];Living will [place] -  Does patient want to make changes to medical advance directive? - - - No - Patient declined No - Patient declined - -  Copy of [place] in Chart? Yes - validated most recent copy scanned in chart (See row information) Yes - validated most recent copy scanned in chart (See row information) No - copy requested - No - copy requested - -  Would patient like information on creating a medical advance directive? - - - - - - -    Current Medications (verified) Outpatient Encounter Medications as of 12/10/2021  Medication Sig   Acetaminophen (TYLENOL PO) Tylenol   acetaminophen (TYLENOL) 500 MG tablet acetaminophen 500 mg tablet   1000 mg by oral route.   linaclotide (LINZESS) 72 MCG capsule Take 1 capsule (72 mcg total) by mouth daily before breakfast.   lisinopril-hydrochlorothiazide (ZESTORETIC) 10-12.5 MG tablet Take 1 tablet by mouth daily.   simvastatin (ZOCOR) 40 MG tablet Take 1 tablet (40 mg total) by mouth daily at 6 PM.   No facility-administered encounter medications on file as of 12/10/2021.    Allergies (verified) Penicillin g and Penicillins   History: Past Medical History:  Diagnosis Date   Degenerative disc disease, lumbar    Diabetes mellitus without complication ([place])    Hypertension    Past Surgical History:  Procedure Laterality Date   KNEE SURGERY     Family History  Problem Relation Age of Onset   Hyperlipidemia Father    Social History   Socioeconomic History   Marital status: Widowed    Spouse name: Not on file   Number of children: Not on file   Years of education: Not on file   Highest education level: Not on file  Occupational History   Occupation: retired  Tobacco Use   Smoking status: Never   Smokeless tobacco: Never  Vaping  Use   Vaping Use: Never used  Substance and Sexual Activity   Alcohol use: No    Alcohol/week: 0.0 standard drinks   Drug use: Never   Sexual activity: Not Currently  Other Topics Concern   Not on file  Social History Narrative   Not on file   Social Determinants of Health   Financial Resource Strain: Low Risk    Difficulty of Paying  Living Expenses: Not hard at all  Food Insecurity: No Food Insecurity   Worried About Charity fundraiser in the Last Year: Never true   [place] in the Last Year: Never true  Transportation Needs: No Transportation Needs   Lack of Transportation (Medical): No   Lack of Transportation (Non-Medical): No  Physical Activity: Inactive   Days of Exercise per Week: 0 days   Minutes of Exercise per Session: 0 min  Stress: No Stress Concern Present   Feeling of Stress : Not at all  Social Connections: Not on file    Tobacco Counseling Counseling given: Not Answered   Clinical Intake:  Pre-visit preparation completed: Yes  Pain : No/denies pain     Nutritional Status: BMI of 19-24  Normal Nutritional Risks: None Diabetes: Yes  How often do you need to have someone help you when you read instructions, pamphlets, or other written materials from your doctor or pharmacy?: 1 - Never What is the last grade level you completed in school?: 12th grade  Diabetic? Yes Nutrition Risk Assessment:  Has the patient had any N/V/D within the last 2 months?  No  Does the patient have any non-healing wounds?  No  Has the patient had any unintentional weight loss or weight gain?  No   Diabetes:  Is the patient diabetic?  Yes  If diabetic, was a CBG obtained today?  No  Did the patient bring in their glucometer from home?  No  How often do you monitor your CBG's? Does not.   Financial Strains and Diabetes Management:  Are you having any financial strains with the device, your supplies or your medication? No .  Does the patient want to be seen by Chronic Care Management for management of their diabetes?  No  Would the patient like to be referred to a Nutritionist or for Diabetic Management?  No   Diabetic Exams:  Diabetic Eye Exam: Completed 12/15/2020 Diabetic Foot Exam: Completed 10/22/2021   Interpreter Needed?: No  Information entered by :: NAllen LPN   Activities of  Daily Living In your present state of health, do you have any difficulty performing the following activities: 12/10/2021  Hearing? N  Comment has hearing aides  Vision? N  Difficulty concentrating or making decisions? N  Walking or climbing stairs? N  Dressing or bathing? N  Doing errands, shopping? Y  Comment does not Physiological scientist and eating ? N  Using the Toilet? N  In the past six months, have you accidently leaked urine? Y  Do you have problems with loss of bowel control? N  Managing your Medications? N  Managing your Finances? N  Housekeeping or managing your Housekeeping? N  Some recent data might be hidden    Patient Care Team: [name], MD as PCP - General (Internal Medicine) Sheard, Briscoe Burns, DPM (Inactive) as Consulting Physician (Podiatry)  Indicate any recent Medical Services you may have received from other than [facility] providers in the past year (date may be approximate).     Assessment:  This is a routine wellness examination for [place].  Hearing/Vision screen Vision Screening - Comments:: Regular eye exams,  Opth  Dietary issues and exercise activities discussed:     Goals Addressed             This Visit's Progress    Patient Stated       12/10/2021, keep getting up       Depression Screen PHQ 2/9 Scores 12/10/2021 12/03/2020 12/03/2020 11/14/2019 10/03/2019 01/30/2019 09/28/2018  PHQ - 2 Score 0 0 0 0 0 0 0  PHQ- 9 Score - - - 0 - - -    Fall Risk Fall Risk  12/10/2021 12/03/2021 12/03/2020 12/03/2020 11/14/2019  Falls in the past year? 0 0 0 0 1  Comment - - - - -  Number falls in past yr: - 0 - - 1  Injury with Fall? - 0 - - 0  Risk for fall due to : Impaired balance/gait;Impaired mobility;Medication side effect Impaired balance/gait;Other (Comment) Medication side effect;Impaired mobility - Impaired balance/gait;Impaired mobility;Medication side effect;History of fall(s)  Risk for fall due to: Comment - uses a rollator  walker - - -  Follow up Falls evaluation completed;Education provided;Falls prevention discussed - Falls evaluation completed;Education provided;Falls prevention discussed - Falls evaluation completed;Education provided;Falls prevention discussed    FALL RISK PREVENTION PERTAINING TO THE HOME:  Any stairs in or around the home? No  If so, are there any without handrails? N/a Home free of loose throw rugs in walkways, pet beds, electrical cords, etc? Yes  Adequate lighting in your home to reduce risk of falls? Yes   ASSISTIVE DEVICES UTILIZED TO PREVENT FALLS:  Life alert? Yes  Use of a cane, walker or w/c? Yes  Grab bars in the bathroom? Yes  Shower chair or bench in shower? Yes  Elevated toilet seat or a handicapped toilet? Yes   TIMED UP AND GO:  Was the test performed? No .      Cognitive Function:     6CIT Screen 12/10/2021 12/03/2020 11/14/2019 09/28/2018  What Year? 0 points 0 points 0 points 0 points  What month? 0 points 0 points 0 points 0 points  What time? 0 points 0 points 0 points 0 points  Count back from 20 0 points 0 points 0 points 0 points  Months in reverse 0 points 0 points 0 points 0 points  Repeat phrase 4 points 0 points 0 points 0 points  Total Score 4 0 0 0    Immunizations Immunization History  Administered Date(s) Administered   Fluad Quad(high Dose 65+) 10/04/2021   Influenza, High Dose Seasonal PF 10/01/2015, 10/09/2016, 10/07/2017, 09/28/2018, 10/03/2019, 10/20/2020   PFIZER(Purple Top)SARS-COV-2 Vaccination 01/15/2020, 02/05/2020, 10/20/2020   Pneumococcal Polysaccharide-23 07/03/2019   Tdap 11/15/2021    TDAP status: Up to date  Flu Vaccine status: Up to date  Pneumococcal vaccine status: Due, Education has been provided regarding the importance of this vaccine. Advised may receive this vaccine at local pharmacy or Health Dept. Aware to provide a copy of the vaccination record if obtained from local pharmacy or Health Dept. Verbalized  acceptance and understanding.  Covid-19 vaccine status: Completed vaccines  Qualifies for Shingles Vaccine? Yes   Zostavax completed No   Shingrix Completed?: No.    Education has been provided regarding the importance of this vaccine. Patient has been advised to call insurance company to determine out of pocket expense if they have not yet received this vaccine. Advised may also receive vaccine at local  pharmacy or Health Dept. Verbalized acceptance and understanding.  Screening Tests Health Maintenance  Topic Date Due   Zoster Vaccines- Shingrix (1 of 2) Never done   Pneumonia Vaccine 21+ Years old (2 - PCV) 07/02/2020   COVID-19 Vaccine (4 - Booster for Pfizer series) 12/15/2020   OPHTHALMOLOGY EXAM  12/15/2021   HEMOGLOBIN A1C  04/22/2022   FOOT EXAM  10/22/2022   TETANUS/TDAP  11/16/2031   INFLUENZA VACCINE  Completed   DEXA SCAN  Completed   HPV VACCINES  Aged Out    Health Maintenance  Health Maintenance Due  Topic Date Due   Zoster Vaccines- Shingrix (1 of 2) Never done   Pneumonia Vaccine 36+ Years old (2 - PCV) 07/02/2020   COVID-19 Vaccine (4 - Booster for Pfizer series) 12/15/2020    Colorectal cancer screening: No longer required.   Mammogram status: No longer required due to age.  Bone Density status: Completed 01/31/2001.   Lung Cancer Screening: (Low Dose CT Chest recommended if Age 44-80 years, 30 pack-year currently smoking OR have quit w/in 15years.) does not qualify.   Lung Cancer Screening Referral: no  Additional Screening:  Hepatitis C Screening: does not qualify;   Vision Screening: Recommended annual ophthalmology exams for early detection of glaucoma and other disorders of the eye. Is the patient up to date with their annual eye exam?  Yes  Who is the provider or what is the name of the office in which the patient attends annual eye exams? New doctor If pt is not established with a provider, would they like to be referred to a provider to  establish care? No .   Dental Screening: Recommended annual dental exams for proper oral hygiene  Community Resource Referral / Chronic Care Management: CRR required this visit?  No   CCM required this visit?  No      Plan:     I have personally reviewed and noted the following in the patients chart:   Medical and social history Use of alcohol, tobacco or illicit drugs  Current medications and supplements including opioid prescriptions.  Functional ability and status Nutritional status Physical activity Advanced directives List of other physicians Hospitalizations, surgeries, and ER visits in previous 12 months Vitals Screenings to include cognitive, depression, and falls Referrals and appointments  In addition, I have reviewed and discussed with patient certain preventive protocols, quality metrics, and best practice recommendations. A written personalized care plan for preventive services as well as general preventive health recommendations were provided to patient.     Kellie Simmering, LPN   [id number]   Nurse Notes: none

## 2021-12-10 NOTE — Patient Instructions (Signed)
Molly Meyer , Thank you for taking time to come for your Medicare Wellness Visit. I appreciate your ongoing commitment to your health goals. Please review the following plan we discussed and let me know if I can assist you in the future.   Screening recommendations/referrals: Colonoscopy: not required Mammogram: not required Bone Density: completed 01/31/2001 Recommended yearly ophthalmology/optometry visit for glaucoma screening and checkup Recommended yearly dental visit for hygiene and checkup  Vaccinations: Influenza vaccine: completed 10/04/2021 Pneumococcal vaccine: due Tdap vaccine: completed 11/15/2021, due 11/16/2031 Shingles vaccine: discussed   Covid-19: 10/20/2020, 02/05/2020, 01/15/2020  Advanced directives: copy in chart  Conditions/risks identified: none  Next appointment: Follow up in one year for your annual wellness visit    Preventive Care 23 Years and Older, Female Preventive care refers to lifestyle choices and visits with your health care provider that can promote health and wellness. What does preventive care include? A yearly physical exam. This is also called an annual well check. Dental exams once or twice a year. Routine eye exams. Ask your health care provider how often you should have your eyes checked. Personal lifestyle choices, including: Daily care of your teeth and gums. Regular physical activity. Eating a healthy diet. Avoiding tobacco and drug use. Limiting alcohol use. Practicing safe sex. Taking low-dose aspirin every day. Taking vitamin and mineral supplements as recommended by your health care provider. What happens during an annual well check? The services and screenings done by your health care provider during your annual well check will depend on your age, overall health, lifestyle risk factors, and family history of disease. Counseling  Your health care provider may ask you questions about your: Alcohol use. Tobacco use. Drug  use. Emotional well-being. Home and relationship well-being. Sexual activity. Eating habits. History of falls. Memory and ability to understand (cognition). Work and work Statistician. Reproductive health. Screening  You may have the following tests or measurements: Height, weight, and BMI. Blood pressure. Lipid and cholesterol levels. These may be checked every 5 years, or more frequently if you are over 31 years old. Skin check. Lung cancer screening. You may have this screening every year starting at age 25 if you have a 30-pack-year history of smoking and currently smoke or have quit within the past 15 years. Fecal occult blood test (FOBT) of the stool. You may have this test every year starting at age 56. Flexible sigmoidoscopy or colonoscopy. You may have a sigmoidoscopy every 5 years or a colonoscopy every 10 years starting at age 73. Hepatitis C blood test. Hepatitis B blood test. Sexually transmitted disease (STD) testing. Diabetes screening. This is done by checking your blood sugar (glucose) after you have not eaten for a while (fasting). You may have this done every 1-3 years. Bone density scan. This is done to screen for osteoporosis. You may have this done starting at age 69. Mammogram. This may be done every 1-2 years. Talk to your health care provider about how often you should have regular mammograms. Talk with your health care provider about your test results, treatment options, and if necessary, the need for more tests. Vaccines  Your health care provider may recommend certain vaccines, such as: Influenza vaccine. This is recommended every year. Tetanus, diphtheria, and acellular pertussis (Tdap, Td) vaccine. You may need a Td booster every 10 years. Zoster vaccine. You may need this after age 82. Pneumococcal 13-valent conjugate (PCV13) vaccine. One dose is recommended after age 46. Pneumococcal polysaccharide (PPSV23) vaccine. One dose is recommended after age  26. Talk to your health care provider about which screenings and vaccines you need and how often you need them. This information is not intended to replace advice given to you by your health care provider. Make sure you discuss any questions you have with your health care provider. Document Released: 01/09/2016 Document Revised: 09/01/2016 Document Reviewed: 10/14/2015 Elsevier Interactive Patient Education  2017 [employer] Prevention in the Home Falls can cause injuries. They can happen to people of all ages. There are many things you can do to make your home safe and to help prevent falls. What can I do on the outside of my home? Regularly fix the edges of walkways and driveways and fix any cracks. Remove anything that might make you trip as you walk through a door, such as a raised step or threshold. Trim any bushes or trees on the path to your home. Use bright outdoor lighting. Clear any walking paths of anything that might make someone trip, such as rocks or tools. Regularly check to see if handrails are loose or broken. Make sure that both sides of any steps have handrails. Any raised decks and porches should have guardrails on the edges. Have any leaves, snow, or ice cleared regularly. Use sand or salt on walking paths during winter. Clean up any spills in your garage right away. This includes oil or grease spills. What can I do in the bathroom? Use night lights. Install grab bars by the toilet and in the tub and shower. Do not use towel bars as grab bars. Use non-skid mats or decals in the tub or shower. If you need to sit down in the shower, use a plastic, non-slip stool. Keep the floor dry. Clean up any water that spills on the floor as soon as it happens. Remove soap buildup in the tub or shower regularly. Attach bath mats securely with double-sided non-slip rug tape. Do not have throw rugs and other things on the floor that can make you trip. What can I do in the  bedroom? Use night lights. Make sure that you have a light by your bed that is easy to reach. Do not use any sheets or blankets that are too big for your bed. They should not hang down onto the floor. Have a firm chair that has side arms. You can use this for support while you get dressed. Do not have throw rugs and other things on the floor that can make you trip. What can I do in the kitchen? Clean up any spills right away. Avoid walking on wet floors. Keep items that you use a lot in easy-to-reach places. If you need to reach something above you, use a strong step stool that has a grab bar. Keep electrical cords out of the way. Do not use floor polish or wax that makes floors slippery. If you must use wax, use non-skid floor wax. Do not have throw rugs and other things on the floor that can make you trip. What can I do with my stairs? Do not leave any items on the stairs. Make sure that there are handrails on both sides of the stairs and use them. Fix handrails that are broken or loose. Make sure that handrails are as long as the stairways. Check any carpeting to make sure that it is firmly attached to the stairs. Fix any carpet that is loose or worn. Avoid having throw rugs at the top or bottom of the stairs. If you do have throw  rugs, attach them to the floor with carpet tape. Make sure that you have a light switch at the top of the stairs and the bottom of the stairs. If you do not have them, ask someone to add them for you. What else can I do to help prevent falls? Wear shoes that: Do not have high heels. Have rubber bottoms. Are comfortable and fit you well. Are closed at the toe. Do not wear sandals. If you use a stepladder: Make sure that it is fully opened. Do not climb a closed stepladder. Make sure that both sides of the stepladder are locked into place. Ask someone to hold it for you, if possible. Clearly mark and make sure that you can see: Any grab bars or  handrails. First and last steps. Where the edge of each step is. Use tools that help you move around (mobility aids) if they are needed. These include: Canes. Walkers. Scooters. Crutches. Turn on the lights when you go into a dark area. Replace any light bulbs as soon as they burn out. Set up your furniture so you have a clear path. Avoid moving your furniture around. If any of your floors are uneven, fix them. If there are any pets around you, be aware of where they are. Review your medicines with your doctor. Some medicines can make you feel dizzy. This can increase your chance of falling. Ask your doctor what other things that you can do to help prevent falls. This information is not intended to replace advice given to you by your health care provider. Make sure you discuss any questions you have with your health care provider. Document Released: 10/09/2009 Document Revised: 05/20/2016 Document Reviewed: 01/17/2015 Elsevier Interactive Patient Education  2017 [employer].

## 2021-12-28 DIAGNOSIS — E538 Deficiency of other specified B group vitamins: Secondary | ICD-10-CM | POA: Insufficient documentation

## 2021-12-29 ENCOUNTER — Ambulatory Visit (INDEPENDENT_AMBULATORY_CARE_PROVIDER_SITE_OTHER): Payer: Medicare Other | Admitting: Podiatry

## 2021-12-29 ENCOUNTER — Other Ambulatory Visit: Payer: Self-pay

## 2021-12-29 ENCOUNTER — Encounter: Payer: Self-pay | Admitting: Podiatry

## 2021-12-29 DIAGNOSIS — B351 Tinea unguium: Secondary | ICD-10-CM

## 2021-12-29 DIAGNOSIS — M79674 Pain in right toe(s): Secondary | ICD-10-CM | POA: Diagnosis not present

## 2021-12-29 DIAGNOSIS — E1142 Type 2 diabetes mellitus with diabetic polyneuropathy: Secondary | ICD-10-CM | POA: Diagnosis not present

## 2021-12-29 DIAGNOSIS — M79675 Pain in left toe(s): Secondary | ICD-10-CM

## 2021-12-31 ENCOUNTER — Encounter (HOSPITAL_BASED_OUTPATIENT_CLINIC_OR_DEPARTMENT_OTHER): Payer: Self-pay | Admitting: Obstetrics and Gynecology

## 2021-12-31 ENCOUNTER — Emergency Department (HOSPITAL_BASED_OUTPATIENT_CLINIC_OR_DEPARTMENT_OTHER): Payer: Medicare Other | Admitting: Radiology

## 2021-12-31 ENCOUNTER — Observation Stay (HOSPITAL_BASED_OUTPATIENT_CLINIC_OR_DEPARTMENT_OTHER)
Admission: EM | Admit: 2021-12-31 | Discharge: 2022-01-02 | Disposition: A | Discharge status: Home / Self Care | Payer: Medicare Other | Attending: Family Medicine | Admitting: Family Medicine

## 2021-12-31 ENCOUNTER — Other Ambulatory Visit: Payer: Self-pay

## 2021-12-31 DIAGNOSIS — I129 Hypertensive chronic kidney disease with stage 1 through stage 4 chronic kidney disease, or unspecified chronic kidney disease: Secondary | ICD-10-CM | POA: Diagnosis not present

## 2021-12-31 DIAGNOSIS — R600 Localized edema: Secondary | ICD-10-CM | POA: Diagnosis not present

## 2021-12-31 DIAGNOSIS — I714 Abdominal aortic aneurysm, without rupture, unspecified: Secondary | ICD-10-CM | POA: Diagnosis not present

## 2021-12-31 DIAGNOSIS — J90 Pleural effusion, not elsewhere classified: Secondary | ICD-10-CM | POA: Diagnosis not present

## 2021-12-31 DIAGNOSIS — E1122 Type 2 diabetes mellitus with diabetic chronic kidney disease: Secondary | ICD-10-CM | POA: Insufficient documentation

## 2021-12-31 DIAGNOSIS — Z79899 Other long term (current) drug therapy: Secondary | ICD-10-CM | POA: Diagnosis not present

## 2021-12-31 DIAGNOSIS — Z20822 Contact with and (suspected) exposure to covid-19: Secondary | ICD-10-CM | POA: Diagnosis not present

## 2021-12-31 DIAGNOSIS — I4891 Unspecified atrial fibrillation: Secondary | ICD-10-CM | POA: Diagnosis not present

## 2021-12-31 DIAGNOSIS — I499 Cardiac arrhythmia, unspecified: Secondary | ICD-10-CM | POA: Diagnosis not present

## 2021-12-31 DIAGNOSIS — N182 Chronic kidney disease, stage 2 (mild): Secondary | ICD-10-CM | POA: Diagnosis not present

## 2021-12-31 DIAGNOSIS — L0390 Cellulitis, unspecified: Secondary | ICD-10-CM

## 2021-12-31 DIAGNOSIS — E119 Type 2 diabetes mellitus without complications: Secondary | ICD-10-CM

## 2021-12-31 DIAGNOSIS — L03116 Cellulitis of left lower limb: Secondary | ICD-10-CM | POA: Insufficient documentation

## 2021-12-31 DIAGNOSIS — R009 Unspecified abnormalities of heart beat: Secondary | ICD-10-CM | POA: Diagnosis present

## 2021-12-31 DIAGNOSIS — I10 Essential (primary) hypertension: Secondary | ICD-10-CM | POA: Diagnosis present

## 2021-12-31 LAB — COMPREHENSIVE METABOLIC PANEL
ALT: 9 U/L (ref 0–44)
AST: 14 U/L — ABNORMAL LOW (ref 15–41)
Albumin: 4.2 g/dL (ref 3.5–5.0)
Alkaline Phosphatase: 39 U/L (ref 38–126)
Anion gap: 11 (ref 5–15)
BUN: 16 mg/dL (ref 8–23)
CO2: 25 mmol/L (ref 22–32)
Calcium: 9.9 mg/dL (ref 8.9–10.3)
Chloride: 103 mmol/L (ref 98–111)
Creatinine, Ser: 0.84 mg/dL (ref 0.44–1.00)
GFR, Estimated: >60 mL/min (ref >60)
Glucose, Bld: 113 mg/dL — ABNORMAL HIGH (ref 70–99)
Potassium: 4 mmol/L (ref 3.5–5.1)
Sodium: 139 mmol/L (ref 135–145)
Total Bilirubin: 0.8 mg/dL (ref 0.3–1.2)
Total Protein: 7.1 g/dL (ref 6.5–8.1)

## 2021-12-31 LAB — CBC WITH DIFFERENTIAL/PLATELET
Abs Immature Granulocytes: 0.02 10*3/uL (ref 0.00–0.07)
Basophils Absolute: 0 10*3/uL (ref 0.0–0.1)
Basophils Relative: 1 %
Eosinophils Absolute: 0.3 10*3/uL (ref 0.0–0.5)
Eosinophils Relative: 6 %
HCT: 42.5 % (ref 36.0–46.0)
Hemoglobin: 14 g/dL (ref 12.0–15.0)
Immature Granulocytes: 0 %
Lymphocytes Relative: 26 %
Lymphs Abs: 1.5 10*3/uL (ref 0.7–4.0)
MCH: 29.3 pg (ref 26.0–34.0)
MCHC: 32.9 g/dL (ref 30.0–36.0)
MCV: 88.9 fL (ref 80.0–100.0)
Monocytes Absolute: 0.6 10*3/uL (ref 0.1–1.0)
Monocytes Relative: 11 %
Neutro Abs: 3.2 10*3/uL (ref 1.7–7.7)
Neutrophils Relative %: 56 %
Platelets: 167 10*3/uL (ref 150–400)
RBC: 4.78 MIL/uL (ref 3.87–5.11)
RDW: 13.2 % (ref 11.5–15.5)
WBC: 5.6 10*3/uL (ref 4.0–10.5)
nRBC: 0 % (ref 0.0–0.2)

## 2021-12-31 LAB — RESP PANEL BY RT-PCR (FLU A&B, COVID) ARPGX2
Influenza A by PCR: NEGATIVE
Influenza B by PCR: NEGATIVE
SARS Coronavirus 2 by RT PCR: NEGATIVE

## 2021-12-31 LAB — PROTIME-INR
INR: 1.1 (ref 0.8–1.2)
Prothrombin Time: 13.8 seconds (ref 11.4–15.2)

## 2021-12-31 LAB — LACTIC ACID, PLASMA: Lactic Acid, Venous: 1 mmol/L (ref 0.5–1.9)

## 2021-12-31 LAB — MAGNESIUM: Magnesium: 2 mg/dL (ref 1.7–2.4)

## 2021-12-31 MED ORDER — DILTIAZEM HCL-DEXTROSE 125-5 MG/125ML-% IV SOLN (PREMIX)
5.0000 mg/h | INTRAVENOUS | Status: DC
  Administered 2021-12-31 – 2022-01-01 (×2): 5 mg/h via INTRAVENOUS
  Filled 2021-12-31 (×3): qty 125

## 2021-12-31 MED ORDER — CEFAZOLIN SODIUM-DEXTROSE 1-4 GM/50ML-% IV SOLN
1.0000 g | Freq: Once | INTRAVENOUS | Status: AC
  Administered 2021-12-31: 1 g via INTRAVENOUS
  Filled 2021-12-31: qty 50

## 2021-12-31 MED ORDER — POTASSIUM CHLORIDE 10 MEQ/100ML IV SOLN
10.0000 meq | Freq: Once | INTRAVENOUS | Status: AC
  Administered 2021-12-31: 10 meq via INTRAVENOUS
  Filled 2021-12-31: qty 100

## 2021-12-31 NOTE — ED Notes (Signed)
Pt's family member left; pt given warm blanket and encouraged to use call bell if needed

## 2021-12-31 NOTE — Progress Notes (Signed)
  [facility] will assume care on arrival to accepting facility. Until arrival, care as per EDP. However, [facility] available 24/7 for questions and assistance.   Nursing staff please page [facility] Admits and Consults ([phone]) as soon as the patient arrives to the hospital.  Yoanna Jurczyk, DO [facility]  

## 2021-12-31 NOTE — ED Triage Notes (Signed)
Patient reports to the ER after going to urgent care today. Patient reports she was told she has cellulitis of the left leg that is starting to go up the calf. Patient was also told to get checked for a blood clot. Patient was also told she had a high heart rate and to get it checked while here.

## 2021-12-31 NOTE — ED Provider Notes (Addendum)
[name] EMERGENCY DEPT Provider Note   CSN: [id number] Arrival date & time: 12/31/21  1710     History  Chief Complaint  Patient presents with   Cellulitis    Molly Meyer is a 88 y.o. female.  Patient sent from urgent care today to report to the emergency department for rapid irregular heart rate and for cellulitis of the left lower leg.  They had mentioned concern for blood clot clinically does not seem to be consistent with blood clot at all.  No significant calf swelling.  Lots of erythema to the distal one third of the left lower extremity and around the ankle.  She does not have a history of atrial fibrillation.  Never been told she had a rapid heart rate.  Or any irregular heart rate.  Patient is not on blood thinners.  Patient is on lisinopril hydrochlorothiazide and is on Zocor and is on Linzess and Tylenol.  In the past she used to be on diabetic medicine but now it is just followed by diet.    So past medical history significant for diabetes without complication and hypertension.  Patient was never a smoker.    Patient denies any shortness of breath or chest pain.  Oxygen saturation on room air is 96%.  Denies any fevers or any upper respiratory symptoms.  She states that the redness started yesterday.  No history of any wound or injury.      Home Medications Prior to Admission medications   Medication Sig Start Date End Date Taking? Authorizing Provider  Acetaminophen (TYLENOL PO) Tylenol    [provider]  acetaminophen (TYLENOL) 500 MG tablet acetaminophen 500 mg tablet   1000 mg by oral route. 06/20/15   [provider]  linaclotide Rolan Lipa) 72 MCG capsule Take 1 capsule (72 mcg total) by mouth daily before breakfast. 12/03/20   [name], MD  lisinopril-hydrochlorothiazide (ZESTORETIC) 10-12.5 MG tablet Take 1 tablet by mouth daily. 08/26/21   [name], MD  simvastatin (ZOCOR) 40 MG tablet Take 1 tablet (40 mg total)  by mouth daily at 6 PM. 08/07/21   Park Liter, MD      Allergies    Penicillin g and Penicillins    Review of Systems   Review of Systems  Constitutional:  Negative for chills and fever.  HENT:  Negative for ear pain and sore throat.   Eyes:  Negative for pain and visual disturbance.  Respiratory:  Negative for cough and shortness of breath.   Cardiovascular:  Positive for palpitations and leg swelling. Negative for chest pain.  Gastrointestinal:  Negative for abdominal pain and vomiting.  Genitourinary:  Negative for dysuria and hematuria.  Musculoskeletal:  Negative for arthralgias and back pain.  Skin:  Positive for rash. Negative for color change.  Neurological:  Negative for seizures and syncope.  All other systems reviewed and are negative.  Physical Exam Updated Vital Signs BP (!) 135/111    Pulse 82    Temp 98.3 F (36.8 C)    Resp 19    SpO2 96%  Physical Exam Vitals and nursing note reviewed.  Constitutional:      General: She is not in acute distress.    Appearance: Normal appearance. She is well-developed. She is not toxic-appearing.  HENT:     Head: Normocephalic and atraumatic.  Eyes:     Extraocular Movements: Extraocular movements intact.     Conjunctiva/sclera: Conjunctivae normal.     Pupils: Pupils are equal, round,  and reactive to light.  Cardiovascular:     Rate and Rhythm: Tachycardia present. Rhythm irregular.     Heart sounds: No murmur heard. Pulmonary:     Effort: Pulmonary effort is normal. No respiratory distress.     Breath sounds: Normal breath sounds.  Abdominal:     Palpations: Abdomen is soft.     Tenderness: There is no abdominal tenderness.  Musculoskeletal:        General: Tenderness present. No swelling.     Cervical back: Neck supple.     Comments: Dorsalis pedis pulse to both feet is 1+.  Left lower extremity distal one third has erythema and increased warmth.  No streaking up to the knee area.  Not involving the foot.   Just ankle and distal leg.  No open lesions.  Skin:    General: Skin is warm and dry.     Capillary Refill: Capillary refill takes less than 2 seconds.  Neurological:     General: No focal deficit present.     Mental Status: She is alert and oriented to person, place, and time.     Cranial Nerves: No cranial nerve deficit.     Sensory: No sensory deficit.     Motor: No weakness.  Psychiatric:        Mood and Affect: Mood normal.    ED Results / Procedures / Treatments   Labs (all labs ordered are listed, but only abnormal results are displayed) Labs Reviewed  COMPREHENSIVE METABOLIC PANEL - Abnormal; Notable for the following components:      Result Value   Glucose, Bld 113 (*)    AST 14 (*)    All other components within normal limits  CULTURE, BLOOD (ROUTINE X 2)  CULTURE, BLOOD (ROUTINE X 2)  RESP PANEL BY RT-PCR (FLU A&B, COVID) ARPGX2  LACTIC ACID, PLASMA  CBC WITH DIFFERENTIAL/PLATELET  PROTIME-INR  URINALYSIS, ROUTINE W REFLEX MICROSCOPIC    EKG EKG Interpretation  Date/Time:  Thursday December 31 2021 17:48:43 EST Ventricular Rate:  124 PR Interval:    QRS Duration: 80 QT Interval:  326 QTC Calculation: 468 R Axis:   24 Text Interpretation: Atrial fibrillation with rapid ventricular response Nonspecific ST and T wave abnormality Abnormal ECG When compared with ECG of 14-Apr-2016 11:24, PREVIOUS ECG IS PRESENT Confirmed by Fredia Sorrow [phone]) on 12/31/2021 9:46:05 PM  Radiology DG Chest 2 View  Result Date: 12/31/2021 CLINICAL DATA:  Suspected sepsis. EXAM: CHEST - 2 VIEW COMPARISON:  Chest x-ray 12/23/2013. FINDINGS: The aorta is tortuous. Cardiac silhouette is within normal limits. There is some strandy opacities in the bilateral lung bases, right greater than left. Findings are similar to the prior study. There are small bilateral pleural effusions. There is no evidence for pneumothorax. Calcified nodules in the right lung are stable. No acute fractures are  seen. Thoracic spinal cord stimulator device is again noted. IMPRESSION: 1. Strandy bibasilar opacities may represent scarring or mild infection/inflammation. There is no focal lung infiltrate. Electronically Signed   By: Ronney Asters M.D.   On: 12/31/2021 18:20    Procedures Procedures  Patient monitoring shows atrial fibrillation with a heart rate ranging from 110 to 130  Medications Ordered in ED Medications  diltiazem (CARDIZEM) 125 mg in dextrose 5% 125 mL (1 mg/mL) infusion (has no administration in time range)  ceFAZolin (ANCEF) IVPB 1 g/50 mL premix (has no administration in time range)    ED Course/ Medical Decision Making/ A&P  Medical Decision Making CRITICAL CARE Performed by: Fredia Sorrow Total critical care time: 45 minutes Critical care time was exclusive of separately billable procedures and treating other patients. Critical care was necessary to treat or prevent imminent or life-threatening deterioration. Critical care was time spent personally by me on the following activities: development of treatment plan with patient and/or surrogate as well as nursing, discussions with consultants, evaluation of patient's response to treatment, examination of patient, obtaining history from patient or surrogate, ordering and performing treatments and interventions, ordering and review of laboratory studies, ordering and review of radiographic studies, pulse oximetry and re-evaluation of patient's condition.  Patient historically with new onset atrial fib with rapid heart rate.  Will start on diltiazem drip.  Based on her age.  Chest x-ray without any acute findings.  No chest pain.  Comprehensive metabolic panel is normal.  GFR is greater than 60.  Lactic acid is normal blood culture sent and pending.  No leukocytosis.  White blood cell count is 5.6 hemoglobin is 14.0.  In addition there appears to be evidence of a left lower extremity cellulitis is could be  treated as an outpatient.  But in the face of the atrial fibrillation patient will require inpatient admission.  Patient not on blood thinners.  For the cellulitis will be started on Ancef.  Patient has a remote history of penicillin allergy.  Patient says she has not had trouble with cephalosporins.  Will send COVID and influenza testing.  We will contact the hospitalist for admission.  Will discuss anticoagulation with them.  Final Clinical Impression(s) / ED Diagnoses Final diagnoses:  Cellulitis of left lower extremity  Atrial fibrillation with [facility])    Rx / DC Orders ED Discharge Orders     None         Fredia Sorrow, MD 12/31/21 2206  Hospitalist will admit.  They agree with the diltiazem and drip.  Patient started on Rocephin for the cellulitis.  Also they are recommending 10 mEq potassium IV and 2 g of magnesium IV.  They state that there are studies that show that that helps them convert more quickly.  They will admit.  Beds are tight so she probably will be with [place] for a while.    Fredia Sorrow, MD 12/31/21 2240  In addition patient's Vascor is 5.  Meaning that she would need anticoagulation.  Did discuss this with Mikael Spray the screening hospitalist for the admission.  He said based on her age he would hold and let the admitting team make the final decision.  So I will not anticoagulate her based on his recommendation.    Fredia Sorrow, MD 12/31/21 [phone]

## 2022-01-01 ENCOUNTER — Encounter (HOSPITAL_COMMUNITY): Payer: Self-pay | Admitting: Internal Medicine

## 2022-01-01 DIAGNOSIS — E119 Type 2 diabetes mellitus without complications: Secondary | ICD-10-CM | POA: Diagnosis not present

## 2022-01-01 DIAGNOSIS — L0390 Cellulitis, unspecified: Secondary | ICD-10-CM

## 2022-01-01 DIAGNOSIS — I4891 Unspecified atrial fibrillation: Secondary | ICD-10-CM | POA: Diagnosis not present

## 2022-01-01 DIAGNOSIS — L03116 Cellulitis of left lower limb: Secondary | ICD-10-CM | POA: Diagnosis not present

## 2022-01-01 DIAGNOSIS — I714 Abdominal aortic aneurysm, without rupture, unspecified: Secondary | ICD-10-CM | POA: Diagnosis not present

## 2022-01-01 DIAGNOSIS — N182 Chronic kidney disease, stage 2 (mild): Secondary | ICD-10-CM | POA: Diagnosis not present

## 2022-01-01 DIAGNOSIS — I10 Essential (primary) hypertension: Secondary | ICD-10-CM

## 2022-01-01 LAB — CBG MONITORING, ED: Glucose-Capillary: 101 mg/dL — ABNORMAL HIGH (ref 70–99)

## 2022-01-01 MED ORDER — SIMVASTATIN 40 MG PO TABS
40.0000 mg | ORAL_TABLET | Freq: Every day | ORAL | Status: DC
  Administered 2022-01-01: 40 mg via ORAL
  Filled 2022-01-01: qty 1

## 2022-01-01 MED ORDER — HYDROCHLOROTHIAZIDE 12.5 MG PO TABS
12.5000 mg | ORAL_TABLET | Freq: Every day | ORAL | Status: DC
  Administered 2022-01-02: 12.5 mg via ORAL
  Filled 2022-01-01: qty 1

## 2022-01-01 MED ORDER — SODIUM CHLORIDE 0.9 % IV SOLN
1.0000 g | INTRAVENOUS | Status: DC
  Administered 2022-01-01 – 2022-01-02 (×2): 1 g via INTRAVENOUS
  Filled 2022-01-01 (×2): qty 10

## 2022-01-01 MED ORDER — HEPARIN (PORCINE) 25000 UT/250ML-% IV SOLN
1050.0000 [IU]/h | INTRAVENOUS | Status: DC
  Administered 2022-01-01: 20:00:00 900 [IU]/h via INTRAVENOUS
  Filled 2022-01-01: qty 250

## 2022-01-01 MED ORDER — ENOXAPARIN SODIUM 40 MG/0.4ML IJ SOSY
40.0000 mg | PREFILLED_SYRINGE | INTRAMUSCULAR | Status: DC
  Administered 2022-01-01: 40 mg via SUBCUTANEOUS
  Filled 2022-01-01: qty 0.4

## 2022-01-01 MED ORDER — LISINOPRIL 10 MG PO TABS
10.0000 mg | ORAL_TABLET | Freq: Every day | ORAL | Status: DC
  Administered 2022-01-02: 10 mg via ORAL
  Filled 2022-01-01: qty 1

## 2022-01-01 MED ORDER — LISINOPRIL-HYDROCHLOROTHIAZIDE 10-12.5 MG PO TABS: 1.0000 | ORAL_TABLET | Freq: Every day | ORAL | Status: DC

## 2022-01-01 NOTE — ED Notes (Signed)
Attempt report x1  

## 2022-01-01 NOTE — Progress Notes (Signed)
[facility] for IV heparin Indication: atrial fibrillation  Allergies  Allergen Reactions   Penicillins Rash    Thinks she may be able to take some forms now.Has patient had a PCN reaction causing immediate rash, facial/tongue/throat swelling, SOB or lightheadedness with hypotension: No Has patient had a PCN reaction causing severe rash involving mucus membranes or skin necrosis: No Has patient had a PCN reaction that required hospitalization No Has patient had a PCN reaction occurring within the last 10 years: No If all of the above answers are "NO", then may proceed with Cephalosporin use.    Patient Measurements: Height: 5\' 7"  (170.2 cm) Weight: 72.1 kg (159 lb) IBW/kg (Calculated) : 61.6 Heparin Dosing Weight: TBW  Vital Signs: Temp: 98 F (36.7 C) (01/06 1610) Temp Source: Oral (01/06 1610) BP: 133/92 (01/06 1610) Pulse Rate: 98 (01/06 1610)  Labs: Recent Labs    12/31/21 1804  HGB 14.0  HCT 42.5  PLT 167  LABPROT 13.8  INR 1.1  CREATININE 0.84    Estimated Creatinine Clearance: 39 mL/min (by C-G formula based on SCr of 0.84 mg/dL).   Medical History: Past Medical History:  Diagnosis Date   Degenerative disc disease, lumbar    Diabetes mellitus without complication ([place])    Hypertension     Medications:  Medications Prior to Admission  Medication Sig Dispense Refill Last Dose   acetaminophen (TYLENOL) 500 MG tablet Take 500 mg by mouth every 6 (six) hours as needed for moderate pain.   Past Month   Cholecalciferol (VITAMIN D) 50 MCG (2000 UT) tablet Take 2,000 Units by mouth daily.   12/28/2021   cyanocobalamin (,VITAMIN B-12,) 1000 MCG/ML injection Inject 1,000 mcg into the muscle See admin instructions. Every 6 or 8 week   06/30/2021   linaclotide (LINZESS) 72 MCG capsule Take 1 capsule (72 mcg total) by mouth daily before breakfast. (Patient taking differently: Take 72 mcg by mouth daily as needed (irritable bowel  syndrome).) 30 capsule 5 12/18/2021   lisinopril-hydrochlorothiazide (ZESTORETIC) 10-12.5 MG tablet Take 1 tablet by mouth daily. 90 tablet 2 12/30/2021   Propylene Glycol (SYSTANE COMPLETE OP) Place 1 drop into both eyes daily as needed (dry eyes).   12/29/2021   simvastatin (ZOCOR) 40 MG tablet Take 1 tablet (40 mg total) by mouth daily at 6 PM. 90 tablet 3 12/30/2021   Scheduled:   [START ON 01/02/2022] lisinopril  10 mg Oral Daily   And   [START ON 01/02/2022] hydrochlorothiazide  12.5 mg Oral Daily   simvastatin  40 mg Oral q1800   PRN:   Assessment: 89 yoF with PMH DM2, HTN, admitted for leg cellulitis and found to have new Afib. Pharmacy to dose IV heparin until Cards can see to make recommendations for long-term anticoagulation  Baseline INR, aPTT: not done Prior anticoagulation: none  Significant events:  Today, 01/01/2022: CBC: WNL No bleeding or infusion issues per nursing SCr at baseline; WNL  Goal of Therapy: Heparin level 0.3-0.7 units/ml Monitor platelets by anticoagulation protocol: Yes  Plan: Heparin 2000 units IV bolus x 1 Heparin 900 units/hr IV infusion - dosing fairly conservatively d/t age Check heparin level 8 hrs after start Daily CBC, daily heparin level once stable Monitor for signs of bleeding or thrombosis  Reuel Boom, PharmD, BCPS [phone] 01/01/2022, 6:34 PM

## 2022-01-01 NOTE — Assessment & Plan Note (Addendum)
Continue lisinopril-hydrochlorothiazide. Now on diltiazem for atrial fibrillation.

## 2022-01-01 NOTE — Assessment & Plan Note (Addendum)
Left lower leg. Received Cefzolin followed by Ceftriaxone. Significant improvement after empiric antibiotics in the ED. Discharge on Cefpodoxime to complete a 5 day course of antibiotics since she has improved so quickly.

## 2022-01-01 NOTE — ED Notes (Signed)
Report called to care link 

## 2022-01-01 NOTE — Assessment & Plan Note (Addendum)
New onset. Started on diltiazem IV with good rate control. Heparin IV started for stroke prevention. Still in atrial fibrillation. Transthoracic Echocardiogram without LV dysfunction. Patient transitioned to Cardizem 120 mg daily and Eliquis 5 mg BID on discharge.

## 2022-01-01 NOTE — Assessment & Plan Note (Signed)
Last hemoglobin A1C of 5.9%

## 2022-01-01 NOTE — ED Notes (Signed)
Consulted with MD r/t Cardizem rate and current vitals.

## 2022-01-01 NOTE — Assessment & Plan Note (Signed)
Stable

## 2022-01-01 NOTE — ED Notes (Signed)
Attempt to call, Rn will call back

## 2022-01-01 NOTE — Assessment & Plan Note (Addendum)
Most recently measured at 5.3 x 5.65 cm in 2020. Patient previously seen by vascular and cardiology with recommendation to not pursue stent placement. AAA measures 6.1 cm on Transthoracic Echocardiogram this admission. Blood pressure control

## 2022-01-01 NOTE — H&P (Signed)
History and Physical    Molly Meyer QQV:[id number] DOB: 1923-12-31 DOA: 12/31/2021  PCP: [name], MD Patient coming from: Home  Chief Complaint: Leg erythema  HPI: Molly Meyer is a 88 y.o. female with medical history significant of diabetes mellitus type 2, CKD stage II, AAA, hypertension. Patient reports having a bug bite on her ankle that was worsening with itching, redness and swelling. She went to the urgent care and while there was found to have tachycardia with associated atrial fibrillation. She first noted the skin issue three days ago. She placed some lotion on her ankle which did not help much. At baseline she ambulates with a cane and walker.  ED Course: Vitals: Temperature of 98.3 F, pulse of 118, respirations of 19, BP of 117/90, SpO2 of 96% Labs: Glucose of 113 Imaging: Bibasilar opacities noted on chest x-ray Medications/Course: Cefazolin IV, Ceftriaxone IV, Diltiazem IV  Review of Systems: Review of Systems  Constitutional:  Negative for chills and fever.  Respiratory:  Negative for cough and shortness of breath.   Cardiovascular:  Negative for chest pain and palpitations.  Gastrointestinal:  Negative for abdominal pain, constipation, diarrhea, nausea and vomiting.  Skin:  Positive for itching and rash.  All other systems reviewed and are negative.  Past Medical History:  Diagnosis Date   Degenerative disc disease, lumbar    Diabetes mellitus without complication ([place])    Hypertension     Past Surgical History:  Procedure Laterality Date   KNEE SURGERY       reports that she has never smoked. She has never used smokeless tobacco. She reports that she does not drink alcohol and does not use drugs.  Allergies  Allergen Reactions   Penicillin G     Other reaction(s): Unknown   Penicillins Rash    Thinks she may be able to take some forms now.Has patient had a PCN reaction causing immediate rash, facial/tongue/throat swelling, SOB or  lightheadedness with hypotension: No Has patient had a PCN reaction causing severe rash involving mucus membranes or skin necrosis: No Has patient had a PCN reaction that required hospitalization No Has patient had a PCN reaction occurring within the last 10 years: No If all of the above answers are "NO", then may proceed with Cephalosporin use.    Family History  Problem Relation Age of Onset   Hyperlipidemia Father    Prior to Admission medications   Medication Sig Start Date End Date Taking? Authorizing Provider  Acetaminophen (TYLENOL PO) Tylenol    [provider]  acetaminophen (TYLENOL) 500 MG tablet acetaminophen 500 mg tablet   1000 mg by oral route. 06/20/15   [provider]  linaclotide Rolan Lipa) 72 MCG capsule Take 1 capsule (72 mcg total) by mouth daily before breakfast. 12/03/20   [name], MD  lisinopril-hydrochlorothiazide (ZESTORETIC) 10-12.5 MG tablet Take 1 tablet by mouth daily. 08/26/21   [name], MD  simvastatin (ZOCOR) 40 MG tablet Take 1 tablet (40 mg total) by mouth daily at 6 PM. 08/07/21   Park Liter, MD    Physical Exam:  Physical Exam Constitutional:      General: She is not in acute distress.    Appearance: She is not diaphoretic.  Eyes:     Conjunctiva/sclera: Conjunctivae normal.     Pupils: Pupils are equal, round, and reactive to light.  Cardiovascular:     Rate and Rhythm: Normal rate. Rhythm irregularly irregular.     Heart sounds: Normal heart sounds. No  murmur heard. Pulmonary:     Effort: Pulmonary effort is normal. No respiratory distress.     Breath sounds: Normal breath sounds. No wheezing or rales.  Abdominal:     General: Bowel sounds are normal. There is no distension.     Palpations: Abdomen is soft.     Tenderness: There is no abdominal tenderness. There is no guarding or rebound.  Musculoskeletal:        General: No tenderness. Normal range of motion.     Cervical back: Normal range of  motion.     Right lower leg: No edema.     Left lower leg: No edema.  Lymphadenopathy:     Cervical: No cervical adenopathy.  Skin:    General: Skin is warm and dry.     Findings: Erythema (left lower leg just above ankle) present.  Neurological:     Mental Status: She is alert and oriented to person, place, and time.     Labs on Admission: I have personally reviewed following labs and imaging studies  CBC: Recent Labs  Lab 12/31/21 1804  WBC 5.6  NEUTROABS 3.2  HGB 14.0  HCT 42.5  MCV 88.9  PLT 379    Basic Metabolic Panel: Recent Labs  Lab 12/31/21 1804  NA 139  K 4.0  CL 103  CO2 25  GLUCOSE 113*  BUN 16  CREATININE 0.84  CALCIUM 9.9  MG 2.0    GFR: Estimated Creatinine Clearance: 39 mL/min (by C-G formula based on SCr of 0.84 mg/dL).  Liver Function Tests: Recent Labs  Lab 12/31/21 1804  AST 14*  ALT 9  ALKPHOS 39  BILITOT 0.8  PROT 7.1  ALBUMIN 4.2   Coagulation Profile: Recent Labs  Lab 12/31/21 1804  INR 1.1    CBG: Recent Labs  Lab 01/01/22 1423  GLUCAP 101*    Urine analysis:    Component Value Date/Time   COLORURINE YELLOW 04/14/2016 1300   APPEARANCEUR CLEAR 04/14/2016 1300   LABSPEC 1.018 04/14/2016 1300   PHURINE 7.0 04/14/2016 1300   GLUCOSEU NEGATIVE 04/14/2016 1300   HGBUR NEGATIVE 04/14/2016 1300   BILIRUBINUR Negative 12/03/2020 1710   KETONESUR NEGATIVE 04/14/2016 1300   PROTEINUR Negative 12/03/2020 1710   PROTEINUR NEGATIVE 04/14/2016 1300   UROBILINOGEN 0.2 12/03/2020 1710   UROBILINOGEN 0.2 11/22/2010 1318   NITRITE Negative 12/03/2020 1710   NITRITE NEGATIVE 04/14/2016 1300   LEUKOCYTESUR Negative 12/03/2020 1710     Radiological Exams on Admission: DG Chest 2 View  Result Date: 12/31/2021 CLINICAL DATA:  Suspected sepsis. EXAM: CHEST - 2 VIEW COMPARISON:  Chest x-ray 12/23/2013. FINDINGS: The aorta is tortuous. Cardiac silhouette is within normal limits. There is some strandy opacities in the  bilateral lung bases, right greater than left. Findings are similar to the prior study. There are small bilateral pleural effusions. There is no evidence for pneumothorax. Calcified nodules in the right lung are stable. No acute fractures are seen. Thoracic spinal cord stimulator device is again noted. IMPRESSION: 1. Strandy bibasilar opacities may represent scarring or mild infection/inflammation. There is no focal lung infiltrate. Electronically Signed   By: Ronney Asters M.D.   On: 12/31/2021 18:20    EKG: Independently reviewed. Atrial fibrillation with tachycardia. Mild lateral lead t-wave flattening  Assessment/Plan  * Atrial fibrillation with rapid ventricular response ([place])- (present on admission) New onset. Started on diltiazem IV with good rate control. Still in atrial fibrillation rhythm. -Continue Diltiazem IV drip -Heparin IV overnight -Cardiology consult for  AM evaluation/recommendations -Transthoracic Echocardiogram   Cellulitis Left lower leg. Received Cefzolin followed by Ceftriaxone. Significant improvement after empiric antibiotics in the ED.  Hypertension- (present on admission) -Continue lisinopril-hydrochlorothiazide   AAA (abdominal aortic aneurysm) without rupture- (present on admission) Most recently measured at 5.3 x 5.65 cm in 2020. Patient previously seen by vascular and cardiology with recommendation to not pursue stent placement. -Blood pressure control  Chronic kidney disease (CKD), stage II (mild)- (present on admission) Stable.  Diabetes mellitus without complication (HCC) Last hemoglobin A1C of 5.9%    DVT prophylaxis: Heparin IV Code Status: DNR Family Communication: Daughter and granddaughter at bedside Disposition Plan: Discharge home likely in 1-2 days pending controlled rate control and cardiology recommendations/management in addition to transition to oral antibiotics Consults called: Cardiology [facility]) Admission status:  Observation   Cordelia Poche, MD [facility] 01/01/2022, 4:43 PM

## 2022-01-01 NOTE — ED Notes (Signed)
[place] to Dr. Alvino Chapel r/t pt. Will start to titrate down and watch Pt Vital

## 2022-01-02 ENCOUNTER — Observation Stay (HOSPITAL_BASED_OUTPATIENT_CLINIC_OR_DEPARTMENT_OTHER): Payer: Medicare Other

## 2022-01-02 DIAGNOSIS — R009 Unspecified abnormalities of heart beat: Secondary | ICD-10-CM | POA: Diagnosis present

## 2022-01-02 DIAGNOSIS — I129 Hypertensive chronic kidney disease with stage 1 through stage 4 chronic kidney disease, or unspecified chronic kidney disease: Secondary | ICD-10-CM | POA: Diagnosis not present

## 2022-01-02 DIAGNOSIS — I714 Abdominal aortic aneurysm, without rupture, unspecified: Secondary | ICD-10-CM

## 2022-01-02 DIAGNOSIS — E119 Type 2 diabetes mellitus without complications: Secondary | ICD-10-CM | POA: Diagnosis not present

## 2022-01-02 DIAGNOSIS — N182 Chronic kidney disease, stage 2 (mild): Secondary | ICD-10-CM | POA: Diagnosis not present

## 2022-01-02 DIAGNOSIS — I4891 Unspecified atrial fibrillation: Secondary | ICD-10-CM

## 2022-01-02 DIAGNOSIS — Z20822 Contact with and (suspected) exposure to covid-19: Secondary | ICD-10-CM | POA: Diagnosis not present

## 2022-01-02 DIAGNOSIS — L03116 Cellulitis of left lower limb: Secondary | ICD-10-CM | POA: Diagnosis not present

## 2022-01-02 DIAGNOSIS — Z79899 Other long term (current) drug therapy: Secondary | ICD-10-CM | POA: Diagnosis not present

## 2022-01-02 DIAGNOSIS — E1122 Type 2 diabetes mellitus with diabetic chronic kidney disease: Secondary | ICD-10-CM | POA: Diagnosis not present

## 2022-01-02 LAB — CBC
HCT: 42.8 % (ref 36.0–46.0)
Hemoglobin: 14 g/dL (ref 12.0–15.0)
MCH: 29.7 pg (ref 26.0–34.0)
MCHC: 32.7 g/dL (ref 30.0–36.0)
MCV: 90.9 fL (ref 80.0–100.0)
Platelets: 157 10*3/uL (ref 150–400)
RBC: 4.71 MIL/uL (ref 3.87–5.11)
RDW: 13.2 % (ref 11.5–15.5)
WBC: 5.1 10*3/uL (ref 4.0–10.5)
nRBC: 0 % (ref 0.0–0.2)

## 2022-01-02 LAB — ECHOCARDIOGRAM COMPLETE
AR max vel: 1.87 cm2
AV Area VTI: 2.04 cm2
AV Area mean vel: 1.78 cm2
AV Mean grad: 2.3 mmHg
AV Peak grad: 4.9 mmHg
Ao pk vel: 1.11 m/s
Height: 67 in
Weight: 2544 oz

## 2022-01-02 LAB — HEPARIN LEVEL (UNFRACTIONATED): Heparin Unfractionated: 0.21 IU/mL — ABNORMAL LOW (ref 0.30–0.70)

## 2022-01-02 LAB — TSH: TSH: 2.443 u[IU]/mL (ref 0.350–4.500)

## 2022-01-02 MED ORDER — CEFADROXIL 500 MG PO CAPS: 500.0000 mg | ORAL_CAPSULE | Freq: Two times a day (BID) | ORAL | 0 refills | Status: AC

## 2022-01-02 MED ORDER — DILTIAZEM HCL ER COATED BEADS 120 MG PO CP24
120.0000 mg | ORAL_CAPSULE | Freq: Every day | ORAL | Status: DC
  Administered 2022-01-02: 120 mg via ORAL
  Filled 2022-01-02: qty 1

## 2022-01-02 MED ORDER — DILTIAZEM HCL ER COATED BEADS 120 MG PO CP24: 120.0000 mg | ORAL_CAPSULE | Freq: Every day | ORAL | 2 refills | Status: DC

## 2022-01-02 MED ORDER — APIXABAN 5 MG PO TABS
5.0000 mg | ORAL_TABLET | Freq: Two times a day (BID) | ORAL | Status: DC
  Administered 2022-01-02: 5 mg via ORAL
  Filled 2022-01-02: qty 1

## 2022-01-02 MED ORDER — APIXABAN 5 MG PO TABS: 5.0000 mg | ORAL_TABLET | Freq: Two times a day (BID) | ORAL | 2 refills | Status: DC

## 2022-01-02 NOTE — TOC Transition Note (Signed)
Transition of Care [facility]) - CM/SW Discharge Note   Patient Details  Name: Molly Meyer MRN: [id number] Date of Birth: [birth year]  [facility] CM/SW Contact:  Ross Ludwig, LCSW Phone Number: 01/02/2022, 6:51 PM   Clinical Narrative:    CSW spoke to patient and her family to ask if they had a preference for agencies, and she said no.  CSW contacted [facility] and they can accept patient for [facility] services.  Patient will be going home with home health through [place] ([place]). CSW signing off please reconsult with any other social work needs, home health agency has been notified of planned discharge.   Final next level of care: [place] Barriers to Discharge: Barriers Resolved   Patient Goals and CMS Choice Patient states their goals for this hospitalization and ongoing recovery are:: To return back home with home health services. CMS Medicare.gov Compare Post Acute Care list provided to:: Patient Choice offered to / list presented to : Patient  Discharge Placement                       Discharge Plan and Services                          HH Arranged: RN, PT, Nurse's Aide [facility] Agency: [place] ([facility]) Date [place]: 01/02/22 Time [place]: 1615 Representative spoke with at [place]: [place] ([place]) Interventions     Readmission Risk Interventions No flowsheet data found.

## 2022-01-02 NOTE — Discharge Summary (Addendum)
Physician Discharge Summary  Molly Meyer YQM:[id number] DOB: [birth year] DOA: 12/31/2021  PCP: Molly Chard, MD  Admit date: 12/31/2021 Discharge date: 01/02/2022  Admitted From: Home Disposition: Home  Recommendations for Outpatient Follow-up:  Follow up with PCP in 1 week Follow up with cardiology Recommend goals of care discussions Please follow up on the following pending results: None  Home Health: PT, RN, aide Equipment/Devices: None  Discharge Condition: Stable CODE STATUS: Full code Diet recommendation: Heart healthy   Brief/Interim Summary:  HPI: Molly Meyer is a 88 y.o. female with medical history significant of diabetes mellitus type 2, CKD stage II, AAA, hypertension. Patient reports having a bug bite on her ankle that was worsening with itching, redness and swelling. She went to the urgent care and while there was found to have tachycardia with associated atrial fibrillation. She first noted the skin issue three days ago. She placed some lotion on her ankle which did not help much. At baseline she ambulates with a cane and walker.   Hospital course:  * Atrial fibrillation with rapid ventricular response ([place])- (present on admission) New onset. Started on diltiazem IV with good rate control. Heparin IV started for stroke prevention. Still in atrial fibrillation. Transthoracic Echocardiogram without LV dysfunction. Patient transitioned to Cardizem 120 mg daily and Eliquis 5 mg BID on discharge.  Cellulitis Left lower leg. Received Cefzolin followed by Ceftriaxone. Significant improvement after empiric antibiotics in the ED. Discharge on Cefpodoxime to complete a 5 day course of antibiotics since she has improved so quickly.  Hypertension- (present on admission) Continue lisinopril-hydrochlorothiazide. Now on diltiazem for atrial fibrillation.  AAA (abdominal aortic aneurysm) without rupture- (present on admission) Most recently measured at 5.3 x 5.65 cm in  2020. Patient previously seen by vascular and cardiology with recommendation to not pursue stent placement. AAA measures 6.1 cm on Transthoracic Echocardiogram this admission. Blood pressure control  Chronic kidney disease (CKD), stage II (mild)- (present on admission) Stable.  Diabetes mellitus without complication ([place]) Last hemoglobin A1C of 5.9%    Discharge Instructions  Discharge Instructions     Call MD for:  severe uncontrolled pain   Complete by: As directed    Call MD for:  temperature >100.4   Complete by: As directed       Allergies as of 01/02/2022       Reactions   Penicillins Rash   Thinks she may be able to take some forms now.Has patient had a PCN reaction causing immediate rash, facial/tongue/throat swelling, SOB or lightheadedness with hypotension: No Has patient had a PCN reaction causing severe rash involving mucus membranes or skin necrosis: No Has patient had a PCN reaction that required hospitalization No Has patient had a PCN reaction occurring within the last 10 years: No If all of the above answers are "NO", then may proceed with Cephalosporin use.        Medication List     TAKE these medications    acetaminophen 500 MG tablet Commonly known as: TYLENOL Take 500 mg by mouth every 6 (six) hours as needed for moderate pain.   apixaban 5 MG Tabs tablet Commonly known as: ELIQUIS Take 1 tablet (5 mg total) by mouth 2 (two) times daily.   cefadroxil 500 MG capsule Commonly known as: DURICEF Take 1 capsule (500 mg total) by mouth 2 (two) times daily for 3 days. Start taking on: January 03, 2022   cyanocobalamin 1000 MCG/ML injection Commonly known as: (VITAMIN B-12) Inject 1,000 mcg  into the muscle See admin instructions. Every 6 or 8 week   diltiazem 120 MG 24 hr capsule Commonly known as: CARDIZEM CD Take 1 capsule (120 mg total) by mouth daily. Start taking on: January 03, 2022   linaclotide 72 MCG capsule Commonly known as:  Linzess Take 1 capsule (72 mcg total) by mouth daily before breakfast. What changed:  when to take this reasons to take this   lisinopril-hydrochlorothiazide 10-12.5 MG tablet Commonly known as: ZESTORETIC Take 1 tablet by mouth daily.   simvastatin 40 MG tablet Commonly known as: ZOCOR Take 1 tablet (40 mg total) by mouth daily at 6 PM.   SYSTANE COMPLETE OP Place 1 drop into both eyes daily as needed (dry eyes).   Vitamin D 50 MCG (2000 UT) tablet Take 2,000 Units by mouth daily.        Allergies  Allergen Reactions   Penicillins Rash    Thinks she may be able to take some forms now.Has patient had a PCN reaction causing immediate rash, facial/tongue/throat swelling, SOB or lightheadedness with hypotension: No Has patient had a PCN reaction causing severe rash involving mucus membranes or skin necrosis: No Has patient had a PCN reaction that required hospitalization No Has patient had a PCN reaction occurring within the last 10 years: No If all of the above answers are "NO", then may proceed with Cephalosporin use.    Consultations: Cardiology   Procedures/Studies: DG Chest 2 View  Result Date: 12/31/2021 CLINICAL DATA:  Suspected sepsis. EXAM: CHEST - 2 VIEW COMPARISON:  Chest x-ray 12/23/2013. FINDINGS: The aorta is tortuous. Cardiac silhouette is within normal limits. There is some strandy opacities in the bilateral lung bases, right greater than left. Findings are similar to the prior study. There are small bilateral pleural effusions. There is no evidence for pneumothorax. Calcified nodules in the right lung are stable. No acute fractures are seen. Thoracic spinal cord stimulator device is again noted. IMPRESSION: 1. Strandy bibasilar opacities may represent scarring or mild infection/inflammation. There is no focal lung infiltrate. Electronically Signed   By: Ronney Asters M.D.   On: 12/31/2021 18:20   ECHOCARDIOGRAM COMPLETE  Result Date: 01/02/2022     ECHOCARDIOGRAM REPORT   Patient Name:   Molly Meyer Date of Exam: 01/02/2022 Medical Rec #:  [id number]        Height:       67.0 in Accession #:    [id number]       Weight:       159.0 lb Date of Birth:  30-Jun-1924         BSA:          1.834 m Patient Age:    89 years         BP:           122/76 mmHg Patient Gender: F                HR:           97 bpm. Exam Location:  Inpatient Procedure: 2D Echo, Cardiac Doppler and Color Doppler Indications:    I48.91* Unspeicified atrial fibrillation  History:        Patient has no prior history of Echocardiogram examinations.                 Arrythmias:Atrial Fibrillation; Risk Factors:Hypertension,                 Diabetes and Dyslipidemia.  Sonographer:    Gari Crown  Adkins Referring Phys: [place]  1. Large 6.3 cm AAA.  2. Left ventricular ejection fraction, by estimation, is 55 to 60%. The left ventricle has normal function. The left ventricle has no regional wall motion abnormalities. Left ventricular diastolic function could not be evaluated.  3. Right ventricular systolic function is normal. The right ventricular size is normal.  4. Left atrial size was mildly dilated.  5. Right atrial size was mildly dilated.  6. The mitral valve is normal in structure. Mild mitral valve regurgitation. No evidence of mitral stenosis.  7. The aortic valve has an indeterminant number of cusps. Aortic valve regurgitation is mild. Aortic valve sclerosis is present, with no evidence of aortic valve stenosis.  8. Aortic dilatation noted. Aneurysm of the abdominal aorta, measuring 63 mm. There is borderline dilatation of the ascending aorta, measuring 39 mm.  9. The inferior vena cava is normal in size with greater than 50% respiratory variability, suggesting right atrial pressure of 3 mmHg. FINDINGS  Left Ventricle: Left ventricular ejection fraction, by estimation, is 55 to 60%. The left ventricle has normal function. The left ventricle has no regional wall motion  abnormalities. The left ventricular internal cavity size was normal in size. There is  no left ventricular hypertrophy. Left ventricular diastolic function could not be evaluated due to atrial fibrillation. Left ventricular diastolic function could not be evaluated. Right Ventricle: The right ventricular size is normal. Right ventricular systolic function is normal. Left Atrium: Left atrial size was mildly dilated. Right Atrium: Right atrial size was mildly dilated. Pericardium: There is no evidence of pericardial effusion. Mitral Valve: The mitral valve is normal in structure. Mild mitral valve regurgitation. No evidence of mitral valve stenosis. Tricuspid Valve: The tricuspid valve is normal in structure. Tricuspid valve regurgitation is mild . No evidence of tricuspid stenosis. Aortic Valve: The aortic valve has an indeterminant number of cusps. Aortic valve regurgitation is mild. Aortic valve sclerosis is present, with no evidence of aortic valve stenosis. Aortic valve mean gradient measures 2.3 mmHg. Aortic valve peak gradient measures 4.9 mmHg. Aortic valve area, by VTI measures 2.04 cm. Pulmonic Valve: The pulmonic valve was not well visualized. Pulmonic valve regurgitation is not visualized. No evidence of pulmonic stenosis. Aorta: Aortic dilatation noted. There is borderline dilatation of the ascending aorta, measuring 39 mm. There is an aneurysm involving the abdominal aorta measuring 63 mm. Venous: The inferior vena cava is normal in size with greater than 50% respiratory variability, suggesting right atrial pressure of 3 mmHg. IAS/Shunts: No atrial level shunt detected by color flow Doppler. Additional Comments: Large 6.3 cm AAA.  LEFT VENTRICLE PLAX 2D LVOT diam:     1.90 cm LV SV:         34 LV SV Index:   18 LVOT Area:     2.84 cm  LEFT ATRIUM           Index LA Vol (A4C): 43.4 ml 23.66 ml/m  AORTIC VALVE AV Area (Vmax):    1.87 cm AV Area (Vmean):   1.78 cm AV Area (VTI):     2.04 cm AV Vmax:            110.67 cm/s AV Vmean:          76.800 cm/s AV VTI:            0.166 m AV Peak Grad:      4.9 mmHg AV Mean Grad:      2.3 mmHg LVOT  Vmax:         72.90 cm/s LVOT Vmean:        48.200 cm/s LVOT VTI:          0.119 m LVOT/AV VTI ratio: 0.72  AORTA Ao Root diam: 3.10 cm Ao Asc diam:  3.90 cm TRICUSPID VALVE TR Peak grad:   19.0 mmHg TR Vmax:        218.00 cm/s  SHUNTS Systemic VTI:  0.12 m Systemic Diam: 1.90 cm Kirk Ruths MD Electronically signed by Kirk Ruths MD Signature Date/Time: 01/02/2022/3:06:17 PM    Final      Subjective: No issues overnight.   Discharge Exam: Vitals:   01/02/22 0559 01/02/22 1345  BP: 122/76   Pulse: 76   Resp:  20  Temp: 98.3 F (36.8 C)   SpO2:     Vitals:   01/01/22 2015 01/02/22 0026 01/02/22 0559 01/02/22 1345  BP: 97/79 117/81 122/76   Pulse: 61 91 76   Resp: 19 20  20   Temp: 98.1 F (36.7 C) 98.1 F (36.7 C) 98.3 F (36.8 C)   TempSrc: Oral Oral Oral   SpO2: 95% 96%    Weight:      Height:        General: Pt is alert, awake, not in acute distress Cardiovascular: Irregular rhythm with normal rate, S1/S2 +, no rubs, no gallops Respiratory: CTA bilaterally, no wheezing, no rhonchi Abdominal: Soft, NT, ND, bowel sounds + Extremities: no edema, no cyanosis. Skin: very mild left ankle erythema with no tenderness    The results of significant diagnostics from this hospitalization (including imaging, microbiology, ancillary and laboratory) are listed below for reference.     Microbiology: Recent Results (from the past 240 hour(s))  Culture, blood (Routine x 2)     Status: None (Preliminary result)   Collection Time: 12/31/21  6:04 PM   Specimen: BLOOD  Result Value Ref Range Status   Specimen Description   Final    BLOOD BLOOD LEFT FOREARM Performed at [facility], [address] [place], [place] [address]    Special Requests   Final    BOTTLES DRAWN AEROBIC AND ANAEROBIC Blood Culture adequate  volume Performed at [facility], [address] [place], [place] [address]    Culture   Final    NO GROWTH 2 DAYS Performed at [facility], [place] [address], [place], [place] [address]    Report Status PENDING  Incomplete  Resp Panel by RT-PCR (Flu A&B, Covid) Nasopharyngeal Swab     Status: None   Collection Time: 12/31/21 10:14 PM   Specimen: Nasopharyngeal Swab; Nasopharyngeal(NP) swabs in vial transport medium  Result Value Ref Range Status   SARS Coronavirus 2 by RT PCR NEGATIVE NEGATIVE Final    Comment: (NOTE) SARS-CoV-2 target nucleic acids are NOT DETECTED.  The SARS-CoV-2 RNA is generally detectable in upper respiratory specimens during the acute phase of infection. The lowest concentration of SARS-CoV-2 viral copies this assay can detect is 138 copies/mL. A negative result does not preclude SARS-Cov-2 infection and should not be used as the sole basis for treatment or other patient management decisions. A negative result may occur with  improper specimen collection/handling, submission of specimen other than nasopharyngeal swab, presence of viral mutation(s) within the areas targeted by this assay, and inadequate number of viral copies(<138 copies/mL). A negative result must be combined with clinical observations, patient history, and epidemiological information. The expected result is Negative.  Fact Sheet  for Patients:  EntrepreneurPulse.com.au  Fact Sheet for Healthcare Providers:  IncredibleEmployment.be  This test is no t yet approved or cleared by the [place] FDA and  has been authorized for detection and/or diagnosis of SARS-CoV-2 by FDA under an Emergency Use Authorization (EUA). This EUA will remain  in effect (meaning this test can be used) for the duration of the COVID-19 declaration under Section 564(b)(1) of the Act, 21 U.S.C.section 360bbb-3(b)(1), unless the authorization is  terminated  or revoked sooner.       Influenza A by PCR NEGATIVE NEGATIVE Final   Influenza B by PCR NEGATIVE NEGATIVE Final    Comment: (NOTE) The Xpert Xpress SARS-CoV-2/FLU/RSV plus assay is intended as an aid in the diagnosis of influenza from Nasopharyngeal swab specimens and should not be used as a sole basis for treatment. Nasal washings and aspirates are unacceptable for Xpert Xpress SARS-CoV-2/FLU/RSV testing.  Fact Sheet for Patients: EntrepreneurPulse.com.au  Fact Sheet for Healthcare Providers: IncredibleEmployment.be  This test is not yet approved or cleared by the [place] FDA and has been authorized for detection and/or diagnosis of SARS-CoV-2 by FDA under an Emergency Use Authorization (EUA). This EUA will remain in effect (meaning this test can be used) for the duration of the COVID-19 declaration under Section 564(b)(1) of the Act, 21 U.S.C. section 360bbb-3(b)(1), unless the authorization is terminated or revoked.  Performed at [facility], [address] [place], [place] [address]   Culture, blood (Routine x 2)     Status: None (Preliminary result)   Collection Time: 01/01/22  3:49 PM   Specimen: BLOOD  Result Value Ref Range Status   Specimen Description   Final    BLOOD SITE NOT SPECIFIED Performed at [facility] [address] [place], [place] [address]    Special Requests   Final    BOTTLES DRAWN AEROBIC ONLY Blood Culture adequate volume Performed at [facility] [address] [place], [place] [address]    Culture   Final    NO GROWTH < 24 HOURS Performed at [facility] [address] [place], [place] [address]    Report Status PENDING  Incomplete     Labs: BNP (last 3 results) No results for input(s): BNP in the last 8760 hours. Basic Metabolic Panel: Recent Labs  Lab 12/31/21 1804  NA 139  K 4.0  CL 103  CO2 25   GLUCOSE 113*  BUN 16  CREATININE 0.84  CALCIUM 9.9  MG 2.0   Liver Function Tests: Recent Labs  Lab 12/31/21 1804  AST 14*  ALT 9  ALKPHOS 39  BILITOT 0.8  PROT 7.1  ALBUMIN 4.2   No results for input(s): LIPASE, AMYLASE in the last 168 hours. No results for input(s): AMMONIA in the last 168 hours. CBC: Recent Labs  Lab 12/31/21 1804 01/02/22 0432  WBC 5.6 5.1  NEUTROABS 3.2  --   HGB 14.0 14.0  HCT 42.5 42.8  MCV 88.9 90.9  PLT 167 157   Cardiac Enzymes: No results for input(s): CKTOTAL, CKMB, CKMBINDEX, TROPONINI in the last 168 hours. BNP: Invalid input(s): POCBNP CBG: Recent Labs  Lab 01/01/22 1423  GLUCAP 101*   D-Dimer No results for input(s): DDIMER in the last 72 hours. Hgb A1c No results for input(s): HGBA1C in the last 72 hours. Lipid Profile No results for input(s): CHOL, HDL, LDLCALC, TRIG, CHOLHDL, LDLDIRECT in the last 72 hours. Thyroid function studies Recent Labs  01/02/22 0432  TSH 2.443   Anemia work up No results for input(s): VITAMINB12, FOLATE, FERRITIN, TIBC, IRON, RETICCTPCT in the last 72 hours. Urinalysis    Component Value Date/Time   COLORURINE YELLOW 04/14/2016 1300   APPEARANCEUR CLEAR 04/14/2016 1300   LABSPEC 1.018 04/14/2016 1300   PHURINE 7.0 04/14/2016 1300   GLUCOSEU NEGATIVE 04/14/2016 1300   HGBUR NEGATIVE 04/14/2016 1300   BILIRUBINUR Negative 12/03/2020 1710   KETONESUR NEGATIVE 04/14/2016 1300   PROTEINUR Negative 12/03/2020 1710   PROTEINUR NEGATIVE 04/14/2016 1300   UROBILINOGEN 0.2 12/03/2020 1710   UROBILINOGEN 0.2 11/22/2010 1318   NITRITE Negative 12/03/2020 1710   NITRITE NEGATIVE 04/14/2016 1300   LEUKOCYTESUR Negative 12/03/2020 1710   Sepsis Labs Invalid input(s): PROCALCITONIN,  WBC,  LACTICIDVEN Microbiology Recent Results (from the past 240 hour(s))  Culture, blood (Routine x 2)     Status: None (Preliminary result)   Collection Time: 12/31/21  6:04 PM   Specimen: BLOOD  Result  Value Ref Range Status   Specimen Description   Final    BLOOD BLOOD LEFT FOREARM Performed at [facility], [address] [place], [place] [address]    Special Requests   Final    BOTTLES DRAWN AEROBIC AND ANAEROBIC Blood Culture adequate volume Performed at [facility], [address] [place], [place] [address]    Culture   Final    NO GROWTH 2 DAYS Performed at [facility], [place] [address] [place], [place] [address]    Report Status PENDING  Incomplete  Resp Panel by RT-PCR (Flu A&B, Covid) Nasopharyngeal Swab     Status: None   Collection Time: 12/31/21 10:14 PM   Specimen: Nasopharyngeal Swab; Nasopharyngeal(NP) swabs in vial transport medium  Result Value Ref Range Status   SARS Coronavirus 2 by RT PCR NEGATIVE NEGATIVE Final    Comment: (NOTE) SARS-CoV-2 target nucleic acids are NOT DETECTED.  The SARS-CoV-2 RNA is generally detectable in upper respiratory specimens during the acute phase of infection. The lowest concentration of SARS-CoV-2 viral copies this assay can detect is 138 copies/mL. A negative result does not preclude SARS-Cov-2 infection and should not be used as the sole basis for treatment or other patient management decisions. A negative result may occur with  improper specimen collection/handling, submission of specimen other than nasopharyngeal swab, presence of viral mutation(s) within the areas targeted by this assay, and inadequate number of viral copies(<138 copies/mL). A negative result must be combined with clinical observations, patient history, and epidemiological information. The expected result is Negative.  Fact Sheet for Patients:  EntrepreneurPulse.com.au  Fact Sheet for Healthcare Providers:  IncredibleEmployment.be  This test is no t yet approved or cleared by the [place] FDA and  has been authorized for detection and/or diagnosis of SARS-CoV-2  by FDA under an Emergency Use Authorization (EUA). This EUA will remain  in effect (meaning this test can be used) for the duration of the COVID-19 declaration under Section 564(b)(1) of the Act, 21 U.S.C.section 360bbb-3(b)(1), unless the authorization is terminated  or revoked sooner.       Influenza A by PCR NEGATIVE NEGATIVE Final   Influenza B by PCR NEGATIVE NEGATIVE Final    Comment: (NOTE) The Xpert Xpress SARS-CoV-2/FLU/RSV plus assay is intended as an aid in the diagnosis of influenza from Nasopharyngeal swab specimens and should not be used as a sole basis for treatment. Nasal washings and aspirates are unacceptable for Xpert Xpress SARS-CoV-2/FLU/RSV testing.  Fact Sheet for  Patients: EntrepreneurPulse.com.au  Fact Sheet for Healthcare Providers: IncredibleEmployment.be  This test is not yet approved or cleared by the [place] FDA and has been authorized for detection and/or diagnosis of SARS-CoV-2 by FDA under an Emergency Use Authorization (EUA). This EUA will remain in effect (meaning this test can be used) for the duration of the COVID-19 declaration under Section 564(b)(1) of the Act, 21 U.S.C. section 360bbb-3(b)(1), unless the authorization is terminated or revoked.  Performed at [facility], [address], [place], [place] [address]   Culture, blood (Routine x 2)     Status: None (Preliminary result)   Collection Time: 01/01/22  3:49 PM   Specimen: BLOOD  Result Value Ref Range Status   Specimen Description   Final    BLOOD SITE NOT SPECIFIED Performed at [facility] [address] [place], [place] [address]    Special Requests   Final    BOTTLES DRAWN AEROBIC ONLY Blood Culture adequate volume Performed at [facility] [address], [place], [place] [address]    Culture   Final    NO GROWTH < 24 HOURS Performed at [facility] [address] [place], [place] [address]    Report Status PENDING  Incomplete    SIGNED:   Cordelia Poche, MD [facility] 01/02/2022, 4:22 PM

## 2022-01-02 NOTE — Progress Notes (Signed)
[place] for IV heparin Indication: atrial fibrillation  Allergies  Allergen Reactions   Penicillins Rash    Thinks she may be able to take some forms now.Has patient had a PCN reaction causing immediate rash, facial/tongue/throat swelling, SOB or lightheadedness with hypotension: No Has patient had a PCN reaction causing severe rash involving mucus membranes or skin necrosis: No Has patient had a PCN reaction that required hospitalization No Has patient had a PCN reaction occurring within the last 10 years: No If all of the above answers are "NO", then may proceed with Cephalosporin use.    Patient Measurements: Height: 5\' 7"  (170.2 cm) Weight: 72.1 kg (159 lb) IBW/kg (Calculated) : 61.6 Heparin Dosing Weight: TBW  Vital Signs: Temp: 98.1 F (36.7 C) (01/07 0026) Temp Source: Oral (01/07 0026) BP: 117/81 (01/07 0026) Pulse Rate: 91 (01/07 0026)  Labs: Recent Labs    12/31/21 1804 01/02/22 0432  HGB 14.0 14.0  HCT 42.5 42.8  PLT 167 157  LABPROT 13.8  --   INR 1.1  --   HEPARINUNFRC  --  0.21*  CREATININE 0.84  --      Estimated Creatinine Clearance: 39 mL/min (by C-G formula based on SCr of 0.84 mg/dL).   Medical History: Past Medical History:  Diagnosis Date   Degenerative disc disease, lumbar    Diabetes mellitus without complication ([place])    Hypertension     Medications:  Medications Prior to Admission  Medication Sig Dispense Refill Last Dose   acetaminophen (TYLENOL) 500 MG tablet Take 500 mg by mouth every 6 (six) hours as needed for moderate pain.   Past Month   Cholecalciferol (VITAMIN D) 50 MCG (2000 UT) tablet Take 2,000 Units by mouth daily.   12/28/2021   cyanocobalamin (,VITAMIN B-12,) 1000 MCG/ML injection Inject 1,000 mcg into the muscle See admin instructions. Every 6 or 8 week   06/30/2021   linaclotide (LINZESS) 72 MCG capsule Take 1 capsule (72 mcg total) by mouth daily before breakfast. (Patient taking  differently: Take 72 mcg by mouth daily as needed (irritable bowel syndrome).) 30 capsule 5 12/18/2021   lisinopril-hydrochlorothiazide (ZESTORETIC) 10-12.5 MG tablet Take 1 tablet by mouth daily. 90 tablet 2 12/30/2021   Propylene Glycol (SYSTANE COMPLETE OP) Place 1 drop into both eyes daily as needed (dry eyes).   12/29/2021   simvastatin (ZOCOR) 40 MG tablet Take 1 tablet (40 mg total) by mouth daily at 6 PM. 90 tablet 3 12/30/2021   Scheduled:   lisinopril  10 mg Oral Daily   And   hydrochlorothiazide  12.5 mg Oral Daily   simvastatin  40 mg Oral q1800   PRN:   Assessment: 89 yoF with PMH DM2, HTN, admitted for leg cellulitis and found to have new Afib. Pharmacy to dose IV heparin until Cards can see to make recommendations for long-term anticoagulation  Baseline INR, aPTT: not done Prior anticoagulation: none  Significant events:  Today, 01/02/2022: Heparin level = 0.21 (subtherapeutic) with heparin gtt @ 900 units/hr CBC: WNL No bleeding or infusion issues noted   Goal of Therapy: Heparin level 0.3-0.7 units/ml Monitor platelets by anticoagulation protocol: Yes  Plan: Increase Heparin to 1100 units/hr IV infusion  Check heparin level 8 hrs after rate increase Daily CBC, daily heparin level once stable Monitor for signs of bleeding or thrombosis  Leone Haven, PharmD 01/02/2022, 5:43 AM

## 2022-01-02 NOTE — Discharge Instructions (Signed)
Information on my medicine - ELIQUIS (apixaban)  This medication education was reviewed with me or my healthcare representative as part of my discharge preparation.  TWhy was Eliquis prescribed for you? Eliquis was prescribed for you to reduce the risk of a blood clot forming that can cause a stroke if you have a medical condition called atrial fibrillation (a type of irregular heartbeat).  What do You need to know about Eliquis ? Take your Eliquis TWICE DAILY - one tablet in the morning and one tablet in the evening with or without food. If you have difficulty swallowing the tablet whole please discuss with your pharmacist how to take the medication safely.  Take Eliquis exactly as prescribed by your doctor and DO NOT stop taking Eliquis without talking to the doctor who prescribed the medication.  Stopping may increase your risk of developing a stroke.  Refill your prescription before you run out.  After discharge, you should have regular check-up appointments with your healthcare provider that is prescribing your Eliquis.  In the future your dose may need to be changed if your kidney function or weight changes by a significant amount or as you get older.  What do you do if you miss a dose? If you miss a dose, take it as soon as you remember on the same day and resume taking twice daily.  Do not take more than one dose of ELIQUIS at the same time to make up a missed dose.  Important Safety Information A possible side effect of Eliquis is bleeding. You should call your healthcare provider right away if you experience any of the following: Bleeding from an injury or your nose that does not stop. Unusual colored urine (red or dark brown) or unusual colored stools (red or black). Unusual bruising for unknown reasons. A serious fall or if you hit your head (even if there is no bleeding).  Some medicines may interact with Eliquis and might increase your risk of bleeding or clotting while  on Eliquis. To help avoid this, consult your healthcare provider or pharmacist prior to using any new prescription or non-prescription medications, including herbals, vitamins, non-steroidal anti-inflammatory drugs (NSAIDs) and supplements.  This website has more information on Eliquis (apixaban): http://www.eliquis.com/eliquis/home  

## 2022-01-02 NOTE — Consult Note (Signed)
Cardiology Consultation:   Patient ID: Molly Meyer MRN: [id number]; DOB: 1924/07/05  Admit date: 12/31/2021 Date of Consult: 01/02/2022  PCP:  [name], Meyer   [facility] Providers Cardiologist:  Dr Agustin Cree   Patient Profile:   Molly Meyer is a 88 y.o. female with a hx of diabetes mellitus, hyperlipidemia, hypertension admitted with lower extremity cellulitis who is being seen 01/02/2022 for the evaluation of persistent atrial fibrillation at the request of Molly Meyer.  History of Present Illness:   Patient is followed by Dr. Agustin Cree.  She is noted to have an abdominal aortic aneurysm most recently evaluated in June 2020 by ultrasound (5.3 x 5.65 cm).  She has declined any procedures for this issue.  Patient seen at urgent care with probable lower extremity cellulitis.  She was found to be tachycardic and sent to emergency room.  Cardiology asked to evaluate.  She has some fatigue/dyspnea on exertion which is chronic.  She denies orthopnea, PND, pedal edema, palpitations, syncope, chest pain or bleeding.  She uses a walker and cane to ambulate.   Past Medical History:  Diagnosis Date   Degenerative disc disease, lumbar    Diabetes mellitus without complication ([place])    Hypertension     Past Surgical History:  Procedure Laterality Date   KNEE SURGERY       Inpatient Medications: Scheduled Meds:  apixaban  5 mg Oral BID   diltiazem  120 mg Oral Daily   lisinopril  10 mg Oral Daily   And   hydrochlorothiazide  12.5 mg Oral Daily   simvastatin  40 mg Oral q1800   Continuous Infusions:  cefTRIAXone (ROCEPHIN)  IV Stopped (01/01/22 1103)   PRN Meds:   Allergies:    Allergies  Allergen Reactions   Penicillins Rash    Thinks she may be able to take some forms now.Has patient had a PCN reaction causing immediate rash, facial/tongue/throat swelling, SOB or lightheadedness with hypotension: No Has patient had a PCN reaction causing severe rash involving  mucus membranes or skin necrosis: No Has patient had a PCN reaction that required hospitalization No Has patient had a PCN reaction occurring within the last 10 years: No If all of the above answers are "NO", then may proceed with Cephalosporin use.    Social History:   Social History   Socioeconomic History   Marital status: Widowed    Spouse name: Not on file   Number of children: Not on file   Years of education: Not on file   Highest education level: Not on file  Occupational History   Occupation: retired  Tobacco Use   Smoking status: Never   Smokeless tobacco: Never  Vaping Use   Vaping Use: Never used  Substance and Sexual Activity   Alcohol use: No    Alcohol/week: 0.0 standard drinks   Drug use: Never   Sexual activity: Not Currently  Other Topics Concern   Not on file  Social History Narrative   Not on file   Social Determinants of Health   Financial Resource Strain: Low Risk    Difficulty of Paying Living Expenses: Not hard at all  Food Insecurity: No Food Insecurity   Worried About Charity fundraiser in the Last Year: Never true   [place] in the Last Year: Never true  Transportation Needs: No Transportation Needs   Lack of Transportation (Medical): No   Lack of Transportation (Non-Medical): No  Physical Activity: Inactive  Days of Exercise per Week: 0 days   Minutes of Exercise per Session: 0 min  Stress: No Stress Concern Present   Feeling of Stress : Not at all  Social Connections: Not on file  Intimate Partner Violence: Not on file    Family History:    Family History  Problem Relation Age of Onset   Hyperlipidemia Father      ROS:  Please see the history of present illness.  Lower extremity erythema and pain All other ROS reviewed and negative.     Physical Exam/Data:   Vitals:   01/01/22 1700 01/01/22 2015 01/02/22 0026 01/02/22 0559  BP:  97/79 117/81 122/76  Pulse:  61 91 76  Resp: 19 19 20    Temp:  98.1 F (36.7 C)  98.1 F (36.7 C) 98.3 F (36.8 C)  TempSrc:  Oral Oral Oral  SpO2:  95% 96%   Weight:      Height:        Intake/Output Summary (Last 24 hours) at 01/02/2022 0710 Last data filed at 01/02/2022 0500 Gross per 24 hour  Intake 269.31 ml  Output 200 ml  Net 69.31 ml   Last 3 Weights 01/01/2022 12/10/2021 12/03/2021  Weight (lbs) 159 lb 159 lb 159 lb 3.2 oz  Weight (kg) 72.122 kg 72.122 kg 72.213 kg     Body mass index is 24.9 kg/m.  General:  Well nourished, well developed, in no acute distress HEENT: normal Neck: no JVD Vascular: No carotid bruits; Distal pulses 2+ bilaterally Cardiac:  normal S1, S2; irregular; no murmur  Lungs:  clear to auscultation bilaterally, no wheezing, rhonchi or rales  Abd: soft, nontender, no hepatomegaly  Ext: no edema Musculoskeletal:  No deformities, BUE and BLE strength normal and equal Skin: warm and dry  Neuro:  CNs 2-12 intact, no focal abnormalities noted Psych:  Normal affect   EKG:  The EKG was personally reviewed and demonstrates: Atrial fibrillation with rapid ventricular response, nonspecific ST changes. Telemetry:  Telemetry was personally reviewed and demonstrates: Atrial fibrillation rate controlled   Laboratory Data:   Chemistry Recent Labs  Lab 12/31/21 1804  NA 139  K 4.0  CL 103  CO2 25  GLUCOSE 113*  BUN 16  CREATININE 0.84  CALCIUM 9.9  MG 2.0  GFRNONAA >60  ANIONGAP 11    Recent Labs  Lab 12/31/21 1804  PROT 7.1  ALBUMIN 4.2  AST 14*  ALT 9  ALKPHOS 39  BILITOT 0.8   Hematology Recent Labs  Lab 12/31/21 1804 01/02/22 0432  WBC 5.6 5.1  RBC 4.78 4.71  HGB 14.0 14.0  HCT 42.5 42.8  MCV 88.9 90.9  MCH 29.3 29.7  MCHC 32.9 32.7  RDW 13.2 13.2  PLT 167 157     Radiology/Studies:  DG Chest 2 View  Result Date: 12/31/2021 CLINICAL DATA:  Suspected sepsis. EXAM: CHEST - 2 VIEW COMPARISON:  Chest x-ray 12/23/2013. FINDINGS: The aorta is tortuous. Cardiac silhouette is within normal limits. There is  some strandy opacities in the bilateral lung bases, right greater than left. Findings are similar to the prior study. There are small bilateral pleural effusions. There is no evidence for pneumothorax. Calcified nodules in the right lung are stable. No acute fractures are seen. Thoracic spinal cord stimulator device is again noted. IMPRESSION: 1. Strandy bibasilar opacities may represent scarring or mild infection/inflammation. There is no focal lung infiltrate. Electronically Signed   By: Ronney Asters M.D.   On: 12/31/2021 18:20  Assessment and Plan:   Persistent atrial fibrillation-duration is unclear.  She was in sinus rhythm in August by ECG.  She is essentially asymptomatic.  We will change Cardizem to 120 mg daily by mouth.  Check echocardiogram and TSH.  Issue of anticoagulation is difficult.  She has multiple embolic risk factors including age greater than 16, female sex, diabetes mellitus, hypertension and vascular disease.  CHA2DS2-VASc is 6.  She will benefit from anticoagulation long-term but also has a large abdominal aortic aneurysm and the risk of bleeding would be higher.  I discussed this in detail with her and she would prefer to avoid the risk of CVA.  She understands the higher risk of bleeding with the abdominal aortic aneurysm.  Discontinue IV heparin and begin apixaban 5 mg twice daily.  Given that she is asymptomatic rate controlled anticoagulation would be the best option long-term. Abdominal aortic aneurysm-she has previously made a decision that she does not want any procedures performed and therefore following not indicated as it would not change management. No CODE BLUE status Hypertension-blood pressure controlled.  Continue present medical regimen.   Risk Assessment/Risk Scores:      :[id number]}    CHA2DS2-VASc Score = 6   This indicates a 9.7% annual risk of stroke. The patient's score is based upon: CHF History: 0 HTN History: 1 Diabetes History: 1 Stroke  History: 0 Vascular Disease History: 1 Age Score: 2 Gender Score: 1        For questions or updates, please contact [place] Please consult [email or web address] for contact info under    Signed, Kirk Ruths, Meyer  01/02/2022 7:10 AM

## 2022-01-02 NOTE — Progress Notes (Signed)
Subjective: Molly Meyer is a 88 y.o. female patient seen today for follow up of  painful thick toenails that are difficult to trim. Pain interferes with ambulation. Aggravating factors include wearing enclosed shoe gear. Pain is relieved with periodic professional debridement.  New problems reported today: None.  Patient does not routinely monitor his/her blood glucose.  PCP is [name], MD. Last visit was: 12/03/2021.  Allergies  Allergen Reactions   Penicillins Rash    Thinks she may be able to take some forms now.Has patient had a PCN reaction causing immediate rash, facial/tongue/throat swelling, SOB or lightheadedness with hypotension: No Has patient had a PCN reaction causing severe rash involving mucus membranes or skin necrosis: No Has patient had a PCN reaction that required hospitalization No Has patient had a PCN reaction occurring within the last 10 years: No If all of the above answers are "NO", then may proceed with Cephalosporin use.    Objective: Physical Exam  General: Patient is a pleasant 88 y.o. Caucasian female WD, WN in NAD. AAO x 3.   Neurovascular Examination: Capillary refill time to digits immediate b/l. Palpable DP pulse(s) b/l LE. Palpable PT pulse(s) b/l LE. Pedal hair sparse. No edema noted b/l LE. No cyanosis or clubbing noted b/l LE.  Protective sensation diminished with 10g monofilament b/l.  Dermatological:  Pedal integument with normal turgor, texture and tone BLE. No open wounds b/l LE. No interdigital macerations noted b/l LE. Toenails 1-5 b/l elongated, discolored, dystrophic, thickened, crumbly with subungual debris and tenderness to dorsal palpation. No hyperkeratotic nor porokeratotic lesions present on today's visit.  Musculoskeletal:  Muscle strength 5/5 to all lower extremity muscle groups bilaterally. HAV with bunion deformity noted b/l LE. Hammertoe deformity noted 1-5 b/l. Pes planus deformity noted bilateral  LE.  Assessment: 1. Pain due to onychomycosis of toenails of both feet   2. Diabetic peripheral neuropathy associated with type 2 diabetes mellitus ([place])     Plan: Patient was evaluated and treated and all questions answered. Consent given for treatment as described below: -Examined patient. -Patient to continue soft, supportive shoe gear daily. -Mycotic toenails 1-5 bilaterally were debrided in length and girth with sterile nail nippers and dremel without incident. -Patient/POA to call should there be question/concern in the interim.  Return in about 3 months (around 03/29/2022).  Marzetta Board, DPM

## 2022-01-02 NOTE — Plan of Care (Signed)
°  Problem: Education: Goal: Knowledge of General Education information will improve Description: Including pain rating scale, medication(s)/side effects and non-pharmacologic comfort measures 01/02/2022 1536 by Sanjuana Mae, RN Outcome: Adequate for Discharge 01/02/2022 1352 by Sanjuana Mae, RN Outcome: Progressing

## 2022-01-02 NOTE — Plan of Care (Signed)
  Problem: Education: Goal: Knowledge of General Education information will improve Description Including pain rating scale, medication(s)/side effects and non-pharmacologic comfort measures Outcome: Progressing   

## 2022-01-03 DIAGNOSIS — Z7901 Long term (current) use of anticoagulants: Secondary | ICD-10-CM | POA: Diagnosis not present

## 2022-01-03 DIAGNOSIS — E1122 Type 2 diabetes mellitus with diabetic chronic kidney disease: Secondary | ICD-10-CM | POA: Diagnosis not present

## 2022-01-03 DIAGNOSIS — S90562D Insect bite (nonvenomous), left ankle, subsequent encounter: Secondary | ICD-10-CM | POA: Diagnosis not present

## 2022-01-03 DIAGNOSIS — I714 Abdominal aortic aneurysm, without rupture, unspecified: Secondary | ICD-10-CM | POA: Diagnosis not present

## 2022-01-03 DIAGNOSIS — Z9181 History of falling: Secondary | ICD-10-CM | POA: Diagnosis not present

## 2022-01-03 DIAGNOSIS — I4891 Unspecified atrial fibrillation: Secondary | ICD-10-CM | POA: Diagnosis not present

## 2022-01-03 DIAGNOSIS — N182 Chronic kidney disease, stage 2 (mild): Secondary | ICD-10-CM | POA: Diagnosis not present

## 2022-01-03 DIAGNOSIS — I129 Hypertensive chronic kidney disease with stage 1 through stage 4 chronic kidney disease, or unspecified chronic kidney disease: Secondary | ICD-10-CM | POA: Diagnosis not present

## 2022-01-03 DIAGNOSIS — M5136 Other intervertebral disc degeneration, lumbar region: Secondary | ICD-10-CM | POA: Diagnosis not present

## 2022-01-03 DIAGNOSIS — L03116 Cellulitis of left lower limb: Secondary | ICD-10-CM | POA: Diagnosis not present

## 2022-01-04 ENCOUNTER — Telehealth: Payer: Self-pay

## 2022-01-04 ENCOUNTER — Encounter: Payer: Self-pay | Admitting: Internal Medicine

## 2022-01-04 DIAGNOSIS — I4891 Unspecified atrial fibrillation: Secondary | ICD-10-CM | POA: Diagnosis not present

## 2022-01-04 DIAGNOSIS — N182 Chronic kidney disease, stage 2 (mild): Secondary | ICD-10-CM | POA: Diagnosis not present

## 2022-01-04 DIAGNOSIS — I129 Hypertensive chronic kidney disease with stage 1 through stage 4 chronic kidney disease, or unspecified chronic kidney disease: Secondary | ICD-10-CM | POA: Diagnosis not present

## 2022-01-04 DIAGNOSIS — E1122 Type 2 diabetes mellitus with diabetic chronic kidney disease: Secondary | ICD-10-CM | POA: Diagnosis not present

## 2022-01-04 DIAGNOSIS — S90562D Insect bite (nonvenomous), left ankle, subsequent encounter: Secondary | ICD-10-CM | POA: Diagnosis not present

## 2022-01-04 DIAGNOSIS — L03116 Cellulitis of left lower limb: Secondary | ICD-10-CM | POA: Diagnosis not present

## 2022-01-04 NOTE — Telephone Encounter (Signed)
Spoke with granddaughter Alvino Blood to inform her of patient's appointment with Dr. Stanford Breed on 02/23/22 at 4:30 pm. She state she would let her mother and patient know of the appointment.

## 2022-01-04 NOTE — Telephone Encounter (Signed)
Transition Care Management Follow-up Telephone Call Date of discharge and from where: 01/02/2022 [facility]  How have you been since you were released from the hospital? Pt states she is just trying to get a good night sleep , she feels miserable in a way.  Any questions or concerns? No  Items Reviewed: Did the pt receive and understand the discharge instructions provided? Yes  Medications obtained and verified? Yes  Other? Yes  Any new allergies since your discharge? No  Dietary orders reviewed? Yes Do you have support at home? Yes   Home Care and Equipment/Supplies: Were home health services ordered? yes If so, what is the name of the agency? [facility]  Has the agency set up a time to come to the patient's home? yes Were any new equipment or medical supplies ordered?  No What is the name of the medical supply agency? N/a Were you able to get the supplies/equipment? not applicable Do you have any questions related to the use of the equipment or supplies? No  Functional Questionnaire: (I = Independent and D = Dependent) ADLs: i  Bathing/Dressing- i  Meal Prep- i  Eating- i  Maintaining continence- i  Transferring/Ambulation- i  Managing Meds- i  Follow up appointments reviewed:  PCP Hospital f/u appt confirmed? Yes  Scheduled to see [name]  on Thursday 12:20am @ [facility]. [facility] f/u appt confirmed? No  Scheduled to see n/a on n/a @ n/a. Are transportation arrangements needed? No  If their condition worsens, is the pt aware to call PCP or go to the Emergency Dept.? Yes Was the patient provided with contact information for the PCP's office or ED? Yes Was to pt encouraged to call back with questions or concerns? Yes

## 2022-01-05 LAB — CULTURE, BLOOD (ROUTINE X 2)
Culture: NO GROWTH
Special Requests: ADEQUATE

## 2022-01-06 LAB — CULTURE, BLOOD (ROUTINE X 2)
Culture: NO GROWTH
Special Requests: ADEQUATE

## 2022-01-07 ENCOUNTER — Inpatient Hospital Stay: Payer: Medicare Other | Admitting: Internal Medicine

## 2022-01-08 DIAGNOSIS — L03116 Cellulitis of left lower limb: Secondary | ICD-10-CM | POA: Diagnosis not present

## 2022-01-08 DIAGNOSIS — E1122 Type 2 diabetes mellitus with diabetic chronic kidney disease: Secondary | ICD-10-CM | POA: Diagnosis not present

## 2022-01-08 DIAGNOSIS — N182 Chronic kidney disease, stage 2 (mild): Secondary | ICD-10-CM | POA: Diagnosis not present

## 2022-01-08 DIAGNOSIS — I129 Hypertensive chronic kidney disease with stage 1 through stage 4 chronic kidney disease, or unspecified chronic kidney disease: Secondary | ICD-10-CM | POA: Diagnosis not present

## 2022-01-08 DIAGNOSIS — S90562D Insect bite (nonvenomous), left ankle, subsequent encounter: Secondary | ICD-10-CM | POA: Diagnosis not present

## 2022-01-08 DIAGNOSIS — I4891 Unspecified atrial fibrillation: Secondary | ICD-10-CM | POA: Diagnosis not present

## 2022-01-08 NOTE — Progress Notes (Signed)
Nonspecific ST-T     HPI: Follow-up atrial fibrillation.  Abdominal ultrasound June 2020 showed 5.3 x 5.65 cm abdominal aortic aneurysm.  Patient has elected to not pursue any intervention and therefore follow-up studies not performed.  Patient recently admitted with lower extremity cellulitis and noted to be in new onset atrial fibrillation.  She was treated with Cardizem and apixaban.  Echocardiogram showed normal LV function, mild biatrial enlargement, mild mitral regurgitation, mild aortic insufficiency and abdominal aortic aneurysm measuring 63 mm.  Patient also with history of diabetes mellitus, hypertension and hyperlipidemia.  Also note she is in no CODE BLUE.  Since she was discharged from the hospital she has some dyspnea on exertion but no orthopnea, PND, pedal edema, chest pain, syncope or bleeding.  She has not fallen.  Current Outpatient Medications  Medication Sig Dispense Refill   acetaminophen (TYLENOL) 500 MG tablet Take 500 mg by mouth every 6 (six) hours as needed for moderate pain.     apixaban (ELIQUIS) 5 MG TABS tablet Take 1 tablet (5 mg total) by mouth 2 (two) times daily. 60 tablet 2   Cholecalciferol (VITAMIN D) 50 MCG (2000 UT) tablet Take 2,000 Units by mouth daily.     cyanocobalamin (,VITAMIN B-12,) 1000 MCG/ML injection Inject 1,000 mcg into the muscle See admin instructions. Every 6 or 8 week     diltiazem (CARDIZEM CD) 120 MG 24 hr capsule Take 1 capsule (120 mg total) by mouth daily. 30 capsule 2   linaclotide (LINZESS) 72 MCG capsule Take 1 capsule (72 mcg total) by mouth daily before breakfast. (Patient taking differently: Take 72 mcg by mouth daily as needed (irritable bowel syndrome).) 30 capsule 5   lisinopril-hydrochlorothiazide (ZESTORETIC) 10-12.5 MG tablet Take 1 tablet by mouth daily. 90 tablet 2   Propylene Glycol (SYSTANE COMPLETE OP) Place 1 drop into both eyes daily as needed (dry eyes).     simvastatin (ZOCOR) 40 MG tablet Take 1 tablet (40 mg total)  by mouth daily at 6 PM. 90 tablet 3   No current facility-administered medications for this visit.     Past Medical History:  Diagnosis Date   Degenerative disc disease, lumbar    Diabetes mellitus without complication ([place])    Hypertension     Past Surgical History:  Procedure Laterality Date   KNEE SURGERY      Social History   Socioeconomic History   Marital status: Widowed    Spouse name: Not on file   Number of children: Not on file   Years of education: Not on file   Highest education level: Not on file  Occupational History   Occupation: retired  Tobacco Use   Smoking status: Never   Smokeless tobacco: Never  Vaping Use   Vaping Use: Never used  Substance and Sexual Activity   Alcohol use: No    Alcohol/week: 0.0 standard drinks   Drug use: Never   Sexual activity: Not Currently  Other Topics Concern   Not on file  Social History Narrative   Not on file   Social Determinants of Health   Financial Resource Strain: Low Risk    Difficulty of Paying Living Expenses: Not hard at all  Food Insecurity: No Food Insecurity   Worried About Charity fundraiser in the Last Year: Never true   [place] in the Last Year: Never true  Transportation Needs: No Transportation Needs   Lack of Transportation (Medical): No   Lack of Transportation (Non-Medical): No  Physical Activity: Inactive   Days of Exercise per Week: 0 days   Minutes of Exercise per Session: 0 min  Stress: No Stress Concern Present   Feeling of Stress : Not at all  Social Connections: Not on file  Intimate Partner Violence: Not on file    Family History  Problem Relation Age of Onset   Hyperlipidemia Father     ROS: no fevers or chills, productive cough, hemoptysis, dysphasia, odynophagia, melena, hematochezia, dysuria, hematuria, rash, seizure activity, orthopnea, PND, pedal edema, claudication. Remaining systems are negative.  Physical Exam: Well-developed well-nourished in no  acute distress.  Skin is warm and dry.  HEENT is normal.  Neck is supple.  Chest is clear to auscultation with normal expansion.  Cardiovascular exam is irregular and tachycardic Abdominal exam nontender or distended. No masses palpated. Extremities show no edema. neuro grossly intact  ECG-atrial fibrillation at a rate of 106, just.  Personally reviewed  A/P  1 permanent atrial fibrillation-plan to continue Cardizem for rate control but increase dose to 180 mg daily.  Previous echocardiogram showed normal LV function.  We discussed the options today of rate control versus rhythm control.  She has mild dyspnea on exertion but this does not appear to be particularly bothersome and she does not want to pursue cardioversion.  We will therefore plan rate control long-term.  Note the issue of anticoagulation is difficult.  She has multiple embolic risk factors and her CHA2DS2-VASc is 6.  However she also has a large abdominal aortic aneurysm with increased risk of rupture/bleed.  We discussed this in the hospital and again today.  She wants to continue with anticoagulation to decrease the risk of CVA regardless of her abdominal aortic aneurysm.  2 abdominal aortic aneurysm-she has previously made the decision that she will not pursue further intervention regardless of the findings.  We have therefore elected not to pursue follow-up imaging.  It would not change our management.  3 hypertension-blood pressure controlled.  Continue present medications.  4 no CODE BLUE  Kirk Ruths, MD

## 2022-01-12 ENCOUNTER — Other Ambulatory Visit: Payer: Self-pay

## 2022-01-12 ENCOUNTER — Encounter: Payer: Self-pay | Admitting: Nurse Practitioner

## 2022-01-12 ENCOUNTER — Ambulatory Visit (INDEPENDENT_AMBULATORY_CARE_PROVIDER_SITE_OTHER): Payer: Medicare Other | Admitting: Nurse Practitioner

## 2022-01-12 VITALS — BP 112/70 | HR 85 | Temp 97.5°F | Ht 67.0 in | Wt 158.0 lb

## 2022-01-12 DIAGNOSIS — I714 Abdominal aortic aneurysm, without rupture, unspecified: Secondary | ICD-10-CM

## 2022-01-12 DIAGNOSIS — S90562D Insect bite (nonvenomous), left ankle, subsequent encounter: Secondary | ICD-10-CM | POA: Diagnosis not present

## 2022-01-12 DIAGNOSIS — I129 Hypertensive chronic kidney disease with stage 1 through stage 4 chronic kidney disease, or unspecified chronic kidney disease: Secondary | ICD-10-CM | POA: Diagnosis not present

## 2022-01-12 DIAGNOSIS — I10 Essential (primary) hypertension: Secondary | ICD-10-CM

## 2022-01-12 DIAGNOSIS — Z09 Encounter for follow-up examination after completed treatment for conditions other than malignant neoplasm: Secondary | ICD-10-CM

## 2022-01-12 DIAGNOSIS — Z2821 Immunization not carried out because of patient refusal: Secondary | ICD-10-CM

## 2022-01-12 DIAGNOSIS — E1122 Type 2 diabetes mellitus with diabetic chronic kidney disease: Secondary | ICD-10-CM | POA: Diagnosis not present

## 2022-01-12 DIAGNOSIS — L03116 Cellulitis of left lower limb: Secondary | ICD-10-CM | POA: Diagnosis not present

## 2022-01-12 DIAGNOSIS — I4891 Unspecified atrial fibrillation: Secondary | ICD-10-CM | POA: Diagnosis not present

## 2022-01-12 DIAGNOSIS — N182 Chronic kidney disease, stage 2 (mild): Secondary | ICD-10-CM | POA: Diagnosis not present

## 2022-01-12 NOTE — Progress Notes (Signed)
I,Katawbba Wiggins,acting as a Education administrator for [employer], NP.,have documented all relevant documentation on the behalf of [employer], NP,as directed by  Bary Castilla, NP while in the presence of Bary Castilla, NP.  This visit occurred during the SARS-CoV-2 public health emergency.  Safety protocols were in place, including screening questions prior to the visit, additional usage of staff PPE, and extensive cleaning of exam room while observing appropriate contact time as indicated for disinfecting solutions.  Subjective:     Patient ID: Molly Meyer , female    DOB: 1924-06-22 , 88 y.o.   MRN: [id number]   Chief Complaint  Patient presents with   Hospitalization Follow-up    HPI  Patient is here for follow up hospital. States that she went to the urgent care for a bug bite and there they noticed she was in A-fibb and was developing cellitis on her left lower extremity. She was in the hospital and was discharged with Cardizem and eliquis. Cellulitis on her left lower leg. She received Cefzolin followed by Ceftriaxone. There was  improvement after empiric antibiotics in the ED. She was discharged on Cefpodoxime to complete a 5 day course of antibiotics. She is done with the antibiotics. Looks good today. Swelling went down according to the patient.  Continue her HTN meds. She takes lisinopril. BP today was 112/70  She has appt with cardiologist  on 1/23  Also she got advanced health care. She receives PT session and she goes for more today.  Diet: She is eating well.     Past Medical History:  Diagnosis Date   Degenerative disc disease, lumbar    Diabetes mellitus without complication (HCC)    Hypertension      Family History  Problem Relation Age of Onset   Hyperlipidemia Father      Current Outpatient Medications:    acetaminophen (TYLENOL) 500 MG tablet, Take 500 mg by mouth every 6 (six) hours as needed for moderate pain., Disp: , Rfl:    apixaban  (ELIQUIS) 5 MG TABS tablet, Take 1 tablet (5 mg total) by mouth 2 (two) times daily., Disp: 60 tablet, Rfl: 2   Cholecalciferol (VITAMIN D) 50 MCG (2000 UT) tablet, Take 2,000 Units by mouth daily., Disp: , Rfl:    cyanocobalamin (,VITAMIN B-12,) 1000 MCG/ML injection, Inject 1,000 mcg into the muscle See admin instructions. Every 6 or 8 week, Disp: , Rfl:    diltiazem (CARDIZEM CD) 120 MG 24 hr capsule, Take 1 capsule (120 mg total) by mouth daily., Disp: 30 capsule, Rfl: 2   linaclotide (LINZESS) 72 MCG capsule, Take 1 capsule (72 mcg total) by mouth daily before breakfast. (Patient taking differently: Take 72 mcg by mouth daily as needed (irritable bowel syndrome).), Disp: 30 capsule, Rfl: 5   lisinopril-hydrochlorothiazide (ZESTORETIC) 10-12.5 MG tablet, Take 1 tablet by mouth daily., Disp: 90 tablet, Rfl: 2   Propylene Glycol (SYSTANE COMPLETE OP), Place 1 drop into both eyes daily as needed (dry eyes)., Disp: , Rfl:    simvastatin (ZOCOR) 40 MG tablet, Take 1 tablet (40 mg total) by mouth daily at 6 PM., Disp: 90 tablet, Rfl: 3   Allergies  Allergen Reactions   Penicillins Rash    Thinks she may be able to take some forms now.Has patient had a PCN reaction causing immediate rash, facial/tongue/throat swelling, SOB or lightheadedness with hypotension: No Has patient had a PCN reaction causing severe rash involving mucus membranes or skin necrosis: No Has patient had a PCN reaction  that required hospitalization No Has patient had a PCN reaction occurring within the last 10 years: No If all of the above answers are "NO", then may proceed with Cephalosporin use.     Review of Systems  Constitutional:  Negative for chills and fever.  Respiratory:  Negative for cough, shortness of breath and wheezing.   Cardiovascular:  Negative for chest pain, palpitations and leg swelling.  Gastrointestinal:  Negative for constipation and diarrhea.  Endocrine: Negative for polydipsia, polyphagia and  polyuria.  Musculoskeletal:  Negative for arthralgias.  Neurological:  Negative for weakness and headaches.    Today's Vitals   01/12/22 1152  BP: 112/70  Pulse: 85  Temp: (!) 97.5 F (36.4 C)  Weight: 158 lb (71.7 kg)  Height: 5\' 7"  (1.702 m)  PainSc: 0-No pain   Body mass index is 24.75 kg/m.   Objective:  Physical Exam Constitutional:      Appearance: Normal appearance.  HENT:     Head: Normocephalic and atraumatic.  Cardiovascular:     Rate and Rhythm: Normal rate and regular rhythm.     Pulses: Normal pulses.     Heart sounds: Normal heart sounds. No murmur heard. Pulmonary:     Effort: Pulmonary effort is normal. No respiratory distress.     Breath sounds: Normal breath sounds. No wheezing.  Musculoskeletal:        General: No swelling or tenderness.     Left lower leg: No edema.     Comments: No swelling, tenderness to left lower extremity (ankle area)   Skin:    General: Skin is warm and dry.     Capillary Refill: Capillary refill takes less than 2 seconds.  Neurological:     Mental Status: She is alert.        Assessment And Plan:     1. Hospital discharge follow-up -Patient was in the ED and hospital from 15/23-01/02/22 for A-fibb and cellulitis -Currently on Cardizem and eliquis  -Completed antibiotics for cellulitis.   2. COVID-19 vaccination declined -Advised patient about the Covid vaccine   3. Atrial fibrillation with rapid ventricular response (HCC) -Stable.  -Currently on Cardizem and eliquis  -Follow up with cardiology on Monday   4. Cellulitis of left lower extremity -No tenderness or swelling to left foot  -Finished ceftriaxone course  -Advised patient if she develops any pain, swelling or tenderness to let us know. Patient verbalized understanding.   5. Essential hypertension -Stable -BP today 112/70  -Continue lisinopril-hctz   6. Abdominal aortic aneurysm (AAA) without rupture, unspecified part  -BP control -Follow up with  cardiologist   Follow up: if symptoms persist or do not get better.   The patient was encouraged to call or send a message through [place] for any questions or concerns.   Side effects and appropriate use of all the medication(s) were discussed with the patient today. Patient advised to use the medication(s) as directed by their healthcare provider. The patient was encouraged to read, review, and understand all associated package inserts and contact our office with any questions or concerns. The patient accepts the risks of the treatment plan and had an opportunity to ask questions.   Patient was given opportunity to ask questions. Patient verbalized understanding of the plan and was able to repeat key elements of the plan. All questions were answered to their satisfaction.  Raman Tonantzin Mimnaugh, DNP   I, Raman Tameisha Covell have reviewed all documentation for this visit. The documentation on 01/12/22 for the exam, diagnosis,  procedures, and orders are all accurate and complete.    IF YOU HAVE BEEN REFERRED TO A SPECIALIST, IT MAY TAKE 1-2 WEEKS TO SCHEDULE/PROCESS THE REFERRAL. IF YOU HAVE NOT HEARD FROM US/SPECIALIST IN TWO WEEKS, PLEASE GIVE [place] A CALL AT [phone].   THE PATIENT IS ENCOURAGED TO PRACTICE SOCIAL DISTANCING DUE TO THE COVID-19 PANDEMIC.

## 2022-01-13 DIAGNOSIS — I4891 Unspecified atrial fibrillation: Secondary | ICD-10-CM | POA: Diagnosis not present

## 2022-01-13 DIAGNOSIS — N182 Chronic kidney disease, stage 2 (mild): Secondary | ICD-10-CM | POA: Diagnosis not present

## 2022-01-13 DIAGNOSIS — E1122 Type 2 diabetes mellitus with diabetic chronic kidney disease: Secondary | ICD-10-CM | POA: Diagnosis not present

## 2022-01-13 DIAGNOSIS — L03116 Cellulitis of left lower limb: Secondary | ICD-10-CM | POA: Diagnosis not present

## 2022-01-13 DIAGNOSIS — S90562D Insect bite (nonvenomous), left ankle, subsequent encounter: Secondary | ICD-10-CM | POA: Diagnosis not present

## 2022-01-13 DIAGNOSIS — I129 Hypertensive chronic kidney disease with stage 1 through stage 4 chronic kidney disease, or unspecified chronic kidney disease: Secondary | ICD-10-CM | POA: Diagnosis not present

## 2022-01-15 DIAGNOSIS — L03116 Cellulitis of left lower limb: Secondary | ICD-10-CM | POA: Diagnosis not present

## 2022-01-15 DIAGNOSIS — N182 Chronic kidney disease, stage 2 (mild): Secondary | ICD-10-CM | POA: Diagnosis not present

## 2022-01-15 DIAGNOSIS — I4891 Unspecified atrial fibrillation: Secondary | ICD-10-CM | POA: Diagnosis not present

## 2022-01-15 DIAGNOSIS — E1122 Type 2 diabetes mellitus with diabetic chronic kidney disease: Secondary | ICD-10-CM | POA: Diagnosis not present

## 2022-01-15 DIAGNOSIS — I129 Hypertensive chronic kidney disease with stage 1 through stage 4 chronic kidney disease, or unspecified chronic kidney disease: Secondary | ICD-10-CM | POA: Diagnosis not present

## 2022-01-15 DIAGNOSIS — S90562D Insect bite (nonvenomous), left ankle, subsequent encounter: Secondary | ICD-10-CM | POA: Diagnosis not present

## 2022-01-18 ENCOUNTER — Encounter: Payer: Self-pay | Admitting: Cardiology

## 2022-01-18 ENCOUNTER — Ambulatory Visit (INDEPENDENT_AMBULATORY_CARE_PROVIDER_SITE_OTHER): Payer: Medicare Other | Admitting: Cardiology

## 2022-01-18 ENCOUNTER — Other Ambulatory Visit: Payer: Self-pay

## 2022-01-18 VITALS — BP 112/66 | HR 106 | Ht 67.0 in | Wt 157.4 lb

## 2022-01-18 DIAGNOSIS — I10 Essential (primary) hypertension: Secondary | ICD-10-CM | POA: Diagnosis not present

## 2022-01-18 DIAGNOSIS — I4821 Permanent atrial fibrillation: Secondary | ICD-10-CM

## 2022-01-18 DIAGNOSIS — I714 Abdominal aortic aneurysm, without rupture, unspecified: Secondary | ICD-10-CM

## 2022-01-18 MED ORDER — DILTIAZEM HCL ER COATED BEADS 180 MG PO CP24: 180.0000 mg | ORAL_CAPSULE | Freq: Every day | ORAL | 2 refills | Status: DC

## 2022-01-18 NOTE — Patient Instructions (Signed)
Medication Instructions:   INCREASE DILTIAZEM TO 180 MG ONCE DAILY  *If you need a refill on your cardiac medications before your next appointment, please call your pharmacy*   Follow-Up: At [facility], you and your health needs are our priority.  As part of our continuing mission to provide you with exceptional heart care, we have created designated Provider Care Teams.  These Care Teams include your primary Cardiologist (physician) and Advanced Practice Providers (APPs -  Physician Assistants and Nurse Practitioners) who all work together to provide you with the care you need, when you need it.  We recommend signing up for the patient portal called "MyChart".  Sign up information is provided on this After Visit Summary.  MyChart is used to connect with patients for Virtual Visits (Telemedicine).  Patients are able to view lab/test results, encounter notes, upcoming appointments, etc.  Non-urgent messages can be sent to your provider as well.   To learn more about what you can do with MyChart, go to [email or web address].    Your next appointment:   3 month(s)  The format for your next appointment:   In Person  Provider:   Kirk Ruths, MD

## 2022-01-19 DIAGNOSIS — I129 Hypertensive chronic kidney disease with stage 1 through stage 4 chronic kidney disease, or unspecified chronic kidney disease: Secondary | ICD-10-CM | POA: Diagnosis not present

## 2022-01-19 DIAGNOSIS — N182 Chronic kidney disease, stage 2 (mild): Secondary | ICD-10-CM | POA: Diagnosis not present

## 2022-01-19 DIAGNOSIS — L03116 Cellulitis of left lower limb: Secondary | ICD-10-CM | POA: Diagnosis not present

## 2022-01-19 DIAGNOSIS — S90562D Insect bite (nonvenomous), left ankle, subsequent encounter: Secondary | ICD-10-CM | POA: Diagnosis not present

## 2022-01-19 DIAGNOSIS — E1122 Type 2 diabetes mellitus with diabetic chronic kidney disease: Secondary | ICD-10-CM | POA: Diagnosis not present

## 2022-01-19 DIAGNOSIS — I4891 Unspecified atrial fibrillation: Secondary | ICD-10-CM | POA: Diagnosis not present

## 2022-01-20 ENCOUNTER — Other Ambulatory Visit: Payer: Self-pay

## 2022-01-20 ENCOUNTER — Telehealth: Payer: Self-pay | Admitting: Cardiology

## 2022-01-20 DIAGNOSIS — I4891 Unspecified atrial fibrillation: Secondary | ICD-10-CM | POA: Diagnosis not present

## 2022-01-20 DIAGNOSIS — N182 Chronic kidney disease, stage 2 (mild): Secondary | ICD-10-CM | POA: Diagnosis not present

## 2022-01-20 DIAGNOSIS — E1122 Type 2 diabetes mellitus with diabetic chronic kidney disease: Secondary | ICD-10-CM | POA: Diagnosis not present

## 2022-01-20 DIAGNOSIS — S90562D Insect bite (nonvenomous), left ankle, subsequent encounter: Secondary | ICD-10-CM | POA: Diagnosis not present

## 2022-01-20 DIAGNOSIS — L03116 Cellulitis of left lower limb: Secondary | ICD-10-CM | POA: Diagnosis not present

## 2022-01-20 DIAGNOSIS — I129 Hypertensive chronic kidney disease with stage 1 through stage 4 chronic kidney disease, or unspecified chronic kidney disease: Secondary | ICD-10-CM | POA: Diagnosis not present

## 2022-01-20 MED ORDER — ROSUVASTATIN CALCIUM 20 MG PO TABS: 20.0000 mg | ORAL_TABLET | Freq: Every day | ORAL | 3 refills | Status: DC

## 2022-01-20 NOTE — Telephone Encounter (Signed)
Yes simvastatin and diltiazem interact. When used together, simvastatin dose should not exceed 10mg  daily and pt is currently taking 40mg  daily which increases her risk of statin myopathy. Recommend changing her from simvastatin to rosuvastatin 20mg  daily which avoids the interaction with diltiazem altogether.

## 2022-01-20 NOTE — Telephone Encounter (Signed)
Called patient, made aware of the change that was needed.  RX sent to pharmacy.  Med list updated.

## 2022-01-20 NOTE — Telephone Encounter (Signed)
She was just updating her medication list and noticed that her diltiazem has a level one interaction with her simvastatin.

## 2022-01-21 DIAGNOSIS — E1122 Type 2 diabetes mellitus with diabetic chronic kidney disease: Secondary | ICD-10-CM | POA: Diagnosis not present

## 2022-01-21 DIAGNOSIS — L03116 Cellulitis of left lower limb: Secondary | ICD-10-CM | POA: Diagnosis not present

## 2022-01-21 DIAGNOSIS — N182 Chronic kidney disease, stage 2 (mild): Secondary | ICD-10-CM | POA: Diagnosis not present

## 2022-01-21 DIAGNOSIS — S90562D Insect bite (nonvenomous), left ankle, subsequent encounter: Secondary | ICD-10-CM | POA: Diagnosis not present

## 2022-01-21 DIAGNOSIS — I4891 Unspecified atrial fibrillation: Secondary | ICD-10-CM | POA: Diagnosis not present

## 2022-01-21 DIAGNOSIS — I129 Hypertensive chronic kidney disease with stage 1 through stage 4 chronic kidney disease, or unspecified chronic kidney disease: Secondary | ICD-10-CM | POA: Diagnosis not present

## 2022-01-22 DIAGNOSIS — I4891 Unspecified atrial fibrillation: Secondary | ICD-10-CM | POA: Diagnosis not present

## 2022-01-22 DIAGNOSIS — S90562D Insect bite (nonvenomous), left ankle, subsequent encounter: Secondary | ICD-10-CM | POA: Diagnosis not present

## 2022-01-22 DIAGNOSIS — L03116 Cellulitis of left lower limb: Secondary | ICD-10-CM | POA: Diagnosis not present

## 2022-01-22 DIAGNOSIS — I129 Hypertensive chronic kidney disease with stage 1 through stage 4 chronic kidney disease, or unspecified chronic kidney disease: Secondary | ICD-10-CM | POA: Diagnosis not present

## 2022-01-22 DIAGNOSIS — E1122 Type 2 diabetes mellitus with diabetic chronic kidney disease: Secondary | ICD-10-CM | POA: Diagnosis not present

## 2022-01-22 DIAGNOSIS — N182 Chronic kidney disease, stage 2 (mild): Secondary | ICD-10-CM | POA: Diagnosis not present

## 2022-01-25 DIAGNOSIS — S90562D Insect bite (nonvenomous), left ankle, subsequent encounter: Secondary | ICD-10-CM | POA: Diagnosis not present

## 2022-01-25 DIAGNOSIS — I129 Hypertensive chronic kidney disease with stage 1 through stage 4 chronic kidney disease, or unspecified chronic kidney disease: Secondary | ICD-10-CM | POA: Diagnosis not present

## 2022-01-25 DIAGNOSIS — L03116 Cellulitis of left lower limb: Secondary | ICD-10-CM | POA: Diagnosis not present

## 2022-01-25 DIAGNOSIS — N182 Chronic kidney disease, stage 2 (mild): Secondary | ICD-10-CM | POA: Diagnosis not present

## 2022-01-25 DIAGNOSIS — E1122 Type 2 diabetes mellitus with diabetic chronic kidney disease: Secondary | ICD-10-CM | POA: Diagnosis not present

## 2022-01-25 DIAGNOSIS — I4891 Unspecified atrial fibrillation: Secondary | ICD-10-CM | POA: Diagnosis not present

## 2022-01-28 ENCOUNTER — Other Ambulatory Visit: Payer: Self-pay

## 2022-01-28 ENCOUNTER — Encounter: Payer: Self-pay | Admitting: Internal Medicine

## 2022-01-28 ENCOUNTER — Ambulatory Visit (INDEPENDENT_AMBULATORY_CARE_PROVIDER_SITE_OTHER): Payer: Medicare Other | Admitting: Internal Medicine

## 2022-01-28 VITALS — BP 110/70 | HR 67 | Temp 98.0°F | Ht 64.2 in | Wt 157.6 lb

## 2022-01-28 DIAGNOSIS — I1310 Hypertensive heart and chronic kidney disease without heart failure, with stage 1 through stage 4 chronic kidney disease, or unspecified chronic kidney disease: Secondary | ICD-10-CM | POA: Diagnosis not present

## 2022-01-28 DIAGNOSIS — I4821 Permanent atrial fibrillation: Secondary | ICD-10-CM | POA: Diagnosis not present

## 2022-01-28 DIAGNOSIS — L03116 Cellulitis of left lower limb: Secondary | ICD-10-CM | POA: Diagnosis not present

## 2022-01-28 DIAGNOSIS — K5909 Other constipation: Secondary | ICD-10-CM | POA: Diagnosis not present

## 2022-01-28 DIAGNOSIS — I4891 Unspecified atrial fibrillation: Secondary | ICD-10-CM | POA: Diagnosis not present

## 2022-01-28 DIAGNOSIS — N182 Chronic kidney disease, stage 2 (mild): Secondary | ICD-10-CM

## 2022-01-28 DIAGNOSIS — S90562D Insect bite (nonvenomous), left ankle, subsequent encounter: Secondary | ICD-10-CM | POA: Diagnosis not present

## 2022-01-28 DIAGNOSIS — E538 Deficiency of other specified B group vitamins: Secondary | ICD-10-CM

## 2022-01-28 DIAGNOSIS — I129 Hypertensive chronic kidney disease with stage 1 through stage 4 chronic kidney disease, or unspecified chronic kidney disease: Secondary | ICD-10-CM | POA: Diagnosis not present

## 2022-01-28 DIAGNOSIS — E1122 Type 2 diabetes mellitus with diabetic chronic kidney disease: Secondary | ICD-10-CM

## 2022-01-28 DIAGNOSIS — D6869 Other thrombophilia: Secondary | ICD-10-CM

## 2022-01-28 MED ORDER — CYANOCOBALAMIN 1000 MCG/ML IJ SOLN
1000.0000 ug | Freq: Once | INTRAMUSCULAR | Status: AC
  Administered 2022-01-28: 1000 ug via INTRAMUSCULAR

## 2022-01-28 MED ORDER — DILTIAZEM HCL ER COATED BEADS 180 MG PO CP24: 180.0000 mg | ORAL_CAPSULE | Freq: Every day | ORAL | 2 refills | Status: DC

## 2022-01-28 NOTE — Patient Instructions (Signed)
Use Linzess 72mg  daily Take stool softener once daily  Use Dulcolax suppository as needed Please call Monday to let us know how you are doing-[phone]  Constipation, Adult Constipation is when a person has fewer than three bowel movements in a week, has difficulty having a bowel movement, or has stools (feces) that are dry, hard, or larger than normal. Constipation may be caused by an underlying condition. It may become worse with age if a person takes certain medicines and does not take in enough fluids. Follow these instructions at home: Eating and drinking  Eat foods that have a lot of fiber, such as beans, whole grains, and fresh fruits and vegetables. Limit foods that are low in fiber and high in fat and processed sugars, such as fried or sweet foods. These include french fries, hamburgers, cookies, candies, and soda. Drink enough fluid to keep your urine pale yellow. General instructions Exercise regularly or as told by your health care provider. Try to do 150 minutes of moderate exercise each week. Use the bathroom when you have the urge to go. Do not hold it in. Take over-the-counter and prescription medicines only as told by your health care provider. This includes any fiber supplements. During bowel movements: Practice deep breathing while relaxing the lower abdomen. Practice pelvic floor relaxation. Watch your condition for any changes. Let your health care provider know about them. Keep all follow-up visits as told by your health care provider. This is important. Contact a health care provider if: You have pain that gets worse. You have a fever. You do not have a bowel movement after 4 days. You vomit. You are not hungry or you lose weight. You are bleeding from the opening between the buttocks (anus). You have thin, pencil-like stools. Get help right away if: You have a fever and your symptoms suddenly get worse. You leak stool or have blood in your stool. Your  abdomen is bloated. You have severe pain in your abdomen. You feel dizzy or you faint. Summary Constipation is when a person has fewer than three bowel movements in a week, has difficulty having a bowel movement, or has stools (feces) that are dry, hard, or larger than normal. Eat foods that have a lot of fiber, such as beans, whole grains, and fresh fruits and vegetables. Drink enough fluid to keep your urine pale yellow. Take over-the-counter and prescription medicines only as told by your health care provider. This includes any fiber supplements. This information is not intended to replace advice given to you by your health care provider. Make sure you discuss any questions you have with your health care provider. Document Revised: 10/31/2019 Document Reviewed: 10/31/2019 Elsevier Patient Education  [place].

## 2022-01-28 NOTE — Progress Notes (Signed)
Rich Brave [place]ittleton,acting as a Education administrator for Maximino Greenland, MD.,have documented all relevant documentation on the behalf of Maximino Greenland, MD,as directed by  Maximino Greenland, MD while in the presence of Maximino Greenland, MD.  This visit occurred during the SARS-CoV-2 public health emergency.  Safety protocols were in place, including screening questions prior to the visit, additional usage of staff PPE, and extensive cleaning of exam room while observing appropriate contact time as indicated for disinfecting solutions.  Subjective:     Patient ID: Molly Meyer , female    DOB: 07-15-1924 , 88 y.o.   MRN: [id number]   Chief Complaint  Patient presents with   Constipation    HPI  Patient feels one of her medications is causing her to be constipated. She states she has been constipated ever since starting diltiazem for her newly diagnosed atrial fibrillation.  She has been taking Linzess once or twice weekly without any relief of her sx. She has not tried daily dosing because it caused diarrhea in the past.    Constipation This is a new problem. The problem is unchanged. The stool is described as firm. She Does not exercise regularly. There has Not been adequate water intake. Associated symptoms include bloating. Pertinent negatives include no abdominal pain, back pain, diarrhea, nausea or rectal pain. Risk factors include change in medication usage/dosage.    Past Medical History:  Diagnosis Date   Degenerative disc disease, lumbar    Diabetes mellitus without complication (HCC)    Hypertension      Family History  Problem Relation Age of Onset   Hyperlipidemia Father      Current Outpatient Medications:    apixaban (ELIQUIS) 5 MG TABS tablet, Take 1 tablet (5 mg total) by mouth 2 (two) times daily., Disp: 60 tablet, Rfl: 2   Cholecalciferol (VITAMIN D) 50 MCG (2000 UT) tablet, Take 2,000 Units by mouth daily., Disp: , Rfl:    linaclotide (LINZESS) 72 MCG capsule, Take 1  capsule (72 mcg total) by mouth daily before breakfast. (Patient taking differently: Take 72 mcg by mouth daily as needed (irritable bowel syndrome).), Disp: 30 capsule, Rfl: 5   lisinopril-hydrochlorothiazide (ZESTORETIC) 10-12.5 MG tablet, Take 1 tablet by mouth daily., Disp: 90 tablet, Rfl: 2   Propylene Glycol (SYSTANE COMPLETE OP), Place 1 drop into both eyes daily as needed (dry eyes)., Disp: , Rfl:    rosuvastatin (CRESTOR) 20 MG tablet, Take 1 tablet (20 mg total) by mouth daily., Disp: 90 tablet, Rfl: 3   acetaminophen (TYLENOL) 500 MG tablet, Take 500 mg by mouth every 6 (six) hours as needed for moderate pain., Disp: , Rfl:    cyanocobalamin (,VITAMIN B-12,) 1000 MCG/ML injection, Inject 1,000 mcg into the muscle See admin instructions. Every 6 or 8 week (Patient not taking: Reported on 01/28/2022), Disp: , Rfl:    diltiazem (CARDIZEM CD) 180 MG 24 hr capsule, Take 1 capsule (180 mg total) by mouth daily., Disp: 90 capsule, Rfl: 2   Allergies  Allergen Reactions   Penicillins Rash    Thinks she may be able to take some forms now.Has patient had a PCN reaction causing immediate rash, facial/tongue/throat swelling, SOB or lightheadedness with hypotension: No Has patient had a PCN reaction causing severe rash involving mucus membranes or skin necrosis: No Has patient had a PCN reaction that required hospitalization No Has patient had a PCN reaction occurring within the last 10 years: No If all of the above answers are "NO",  then may proceed with Cephalosporin use.     Review of Systems  Constitutional: Negative.   Respiratory: Negative.    Cardiovascular: Negative.   Gastrointestinal:  Positive for bloating and constipation. Negative for abdominal pain, diarrhea, nausea and rectal pain.  Musculoskeletal:  Negative for back pain.  Neurological: Negative.   Psychiatric/Behavioral: Negative.      Today's Vitals   01/28/22 1500  BP: 110/70  Pulse: 67  Temp: 98 F (36.7 C)  Weight:  157 lb 9.6 oz (71.5 kg)  Height: 5' 4.2" (1.631 m)  PainSc: 0-No pain   Body mass index is 26.88 kg/m.  Wt Readings from Last 3 Encounters:  01/28/22 157 lb 9.6 oz (71.5 kg)  01/18/22 157 lb 6.4 oz (71.4 kg)  01/12/22 158 lb (71.7 kg)     Objective:  Physical Exam Vitals and nursing note reviewed.  Constitutional:      Appearance: Normal appearance.  HENT:     Head: Normocephalic and atraumatic.     Nose:     Comments: Masked     Mouth/Throat:     Comments: Masked  Eyes:     Extraocular Movements: Extraocular movements intact.  Cardiovascular:     Rate and Rhythm: Normal rate. Rhythm irregular.     Heart sounds: Normal heart sounds.  Pulmonary:     Effort: Pulmonary effort is normal.     Breath sounds: Normal breath sounds.  Abdominal:     General: Bowel sounds are normal.     Palpations: Abdomen is soft.  Musculoskeletal:     Cervical back: Normal range of motion.  Skin:    General: Skin is warm.  Neurological:     General: No focal deficit present.     Mental Status: She is alert.  Psychiatric:        Mood and Affect: Mood normal.        Behavior: Behavior normal.      Assessment And Plan:     1. Chronic constipation Comments: She is advised to use Dulcolax suppository today to eliminate stool in rectal vault, then start Linzess 72 qd instead of weekly. She will add stool softener as well.   2. Hypertensive heart and renal disease with renal failure, stage 1 through stage 4 or unspecified chronic kidney disease, without heart failure Comments: Chronic, well controlled. No med changes. Advised to follow low sodium diet.   3. Permanent atrial fibrillation (HCC) Comments: Chronic. She is rate controlled and properly anticoagulated.  - diltiazem (CARDIZEM CD) 180 MG 24 hr capsule; Take 1 capsule (180 mg total) by mouth daily.  Dispense: 90 capsule; Refill: 2  4. Type 2 diabetes mellitus with stage 2 chronic kidney disease, without long-term current use of  insulin (HCC) Comments: Chronic, I will check an a1c today. Importance of dietary/medication compliance was discussed with the patient. She will f/u in 3-4 months for re-evaluation.  - Hemoglobin A1c - Lipid panel  5. Vitamin B12 deficiency Comments: She was given vitamin B12 IM x 1.  - cyanocobalamin ((VITAMIN B-12)) injection 1,000 mcg  6. Acquired thrombophilia ([place]) Comments: She is on Eliquis due to atrial fibrillation.    Patient was given opportunity to ask questions. Patient verbalized understanding of the plan and was able to repeat key elements of the plan. All questions were answered to their satisfaction.   I, Maximino Greenland, MD, have reviewed all documentation for this visit. The documentation on 01/28/22 for the exam, diagnosis, procedures, and orders are all accurate  and complete.   IF YOU HAVE BEEN REFERRED TO A SPECIALIST, IT MAY TAKE 1-2 WEEKS TO SCHEDULE/PROCESS THE REFERRAL. IF YOU HAVE NOT HEARD FROM US/SPECIALIST IN TWO WEEKS, PLEASE GIVE [place] A CALL AT [phone].   THE PATIENT IS ENCOURAGED TO PRACTICE SOCIAL DISTANCING DUE TO THE COVID-19 PANDEMIC.

## 2022-01-29 LAB — LIPID PANEL
Chol/HDL Ratio: 2.4 ratio (ref 0.0–4.4)
Cholesterol, Total: 133 mg/dL (ref 100–199)
HDL: 56 mg/dL (ref >39)
LDL Chol Calc (NIH): 61 mg/dL (ref 0–99)
Triglycerides: 84 mg/dL (ref 0–149)
VLDL Cholesterol Cal: 16 mg/dL (ref 5–40)

## 2022-01-29 LAB — HEMOGLOBIN A1C
Est. average glucose Bld gHb Est-mCnc: 137 mg/dL
Hgb A1c MFr Bld: 6.4 % — ABNORMAL HIGH (ref 4.8–5.6)

## 2022-02-02 DIAGNOSIS — Z9181 History of falling: Secondary | ICD-10-CM | POA: Diagnosis not present

## 2022-02-02 DIAGNOSIS — I4891 Unspecified atrial fibrillation: Secondary | ICD-10-CM | POA: Diagnosis not present

## 2022-02-02 DIAGNOSIS — M5136 Other intervertebral disc degeneration, lumbar region: Secondary | ICD-10-CM | POA: Diagnosis not present

## 2022-02-02 DIAGNOSIS — I714 Abdominal aortic aneurysm, without rupture, unspecified: Secondary | ICD-10-CM | POA: Diagnosis not present

## 2022-02-02 DIAGNOSIS — E1122 Type 2 diabetes mellitus with diabetic chronic kidney disease: Secondary | ICD-10-CM | POA: Diagnosis not present

## 2022-02-02 DIAGNOSIS — N182 Chronic kidney disease, stage 2 (mild): Secondary | ICD-10-CM | POA: Diagnosis not present

## 2022-02-02 DIAGNOSIS — S90562D Insect bite (nonvenomous), left ankle, subsequent encounter: Secondary | ICD-10-CM | POA: Diagnosis not present

## 2022-02-02 DIAGNOSIS — L03116 Cellulitis of left lower limb: Secondary | ICD-10-CM | POA: Diagnosis not present

## 2022-02-02 DIAGNOSIS — I129 Hypertensive chronic kidney disease with stage 1 through stage 4 chronic kidney disease, or unspecified chronic kidney disease: Secondary | ICD-10-CM | POA: Diagnosis not present

## 2022-02-02 DIAGNOSIS — Z7901 Long term (current) use of anticoagulants: Secondary | ICD-10-CM | POA: Diagnosis not present

## 2022-02-09 DIAGNOSIS — N182 Chronic kidney disease, stage 2 (mild): Secondary | ICD-10-CM | POA: Diagnosis not present

## 2022-02-09 DIAGNOSIS — E1122 Type 2 diabetes mellitus with diabetic chronic kidney disease: Secondary | ICD-10-CM | POA: Diagnosis not present

## 2022-02-09 DIAGNOSIS — L03116 Cellulitis of left lower limb: Secondary | ICD-10-CM | POA: Diagnosis not present

## 2022-02-09 DIAGNOSIS — I129 Hypertensive chronic kidney disease with stage 1 through stage 4 chronic kidney disease, or unspecified chronic kidney disease: Secondary | ICD-10-CM | POA: Diagnosis not present

## 2022-02-09 DIAGNOSIS — I4891 Unspecified atrial fibrillation: Secondary | ICD-10-CM | POA: Diagnosis not present

## 2022-02-09 DIAGNOSIS — S90562D Insect bite (nonvenomous), left ankle, subsequent encounter: Secondary | ICD-10-CM | POA: Diagnosis not present

## 2022-02-10 DIAGNOSIS — I129 Hypertensive chronic kidney disease with stage 1 through stage 4 chronic kidney disease, or unspecified chronic kidney disease: Secondary | ICD-10-CM | POA: Diagnosis not present

## 2022-02-10 DIAGNOSIS — L03116 Cellulitis of left lower limb: Secondary | ICD-10-CM | POA: Diagnosis not present

## 2022-02-10 DIAGNOSIS — E1122 Type 2 diabetes mellitus with diabetic chronic kidney disease: Secondary | ICD-10-CM | POA: Diagnosis not present

## 2022-02-10 DIAGNOSIS — I4891 Unspecified atrial fibrillation: Secondary | ICD-10-CM | POA: Diagnosis not present

## 2022-02-10 DIAGNOSIS — S90562D Insect bite (nonvenomous), left ankle, subsequent encounter: Secondary | ICD-10-CM | POA: Diagnosis not present

## 2022-02-10 DIAGNOSIS — N182 Chronic kidney disease, stage 2 (mild): Secondary | ICD-10-CM | POA: Diagnosis not present

## 2022-02-15 ENCOUNTER — Other Ambulatory Visit: Payer: Self-pay

## 2022-02-15 ENCOUNTER — Encounter (HOSPITAL_COMMUNITY): Payer: Self-pay | Admitting: Emergency Medicine

## 2022-02-15 ENCOUNTER — Ambulatory Visit (HOSPITAL_COMMUNITY)
Admission: EM | Admit: 2022-02-15 | Discharge: 2022-02-15 | Disposition: A | Discharge status: Home / Self Care | Payer: Medicare Other | Attending: Family Medicine | Admitting: Family Medicine

## 2022-02-15 DIAGNOSIS — H9202 Otalgia, left ear: Secondary | ICD-10-CM | POA: Insufficient documentation

## 2022-02-15 DIAGNOSIS — R0602 Shortness of breath: Secondary | ICD-10-CM | POA: Diagnosis not present

## 2022-02-15 DIAGNOSIS — I129 Hypertensive chronic kidney disease with stage 1 through stage 4 chronic kidney disease, or unspecified chronic kidney disease: Secondary | ICD-10-CM | POA: Insufficient documentation

## 2022-02-15 DIAGNOSIS — E1122 Type 2 diabetes mellitus with diabetic chronic kidney disease: Secondary | ICD-10-CM | POA: Diagnosis not present

## 2022-02-15 DIAGNOSIS — Z7901 Long term (current) use of anticoagulants: Secondary | ICD-10-CM | POA: Insufficient documentation

## 2022-02-15 DIAGNOSIS — J069 Acute upper respiratory infection, unspecified: Secondary | ICD-10-CM

## 2022-02-15 DIAGNOSIS — U071 COVID-19: Secondary | ICD-10-CM | POA: Diagnosis not present

## 2022-02-15 DIAGNOSIS — J029 Acute pharyngitis, unspecified: Secondary | ICD-10-CM | POA: Diagnosis present

## 2022-02-15 DIAGNOSIS — Z79899 Other long term (current) drug therapy: Secondary | ICD-10-CM | POA: Diagnosis not present

## 2022-02-15 DIAGNOSIS — N182 Chronic kidney disease, stage 2 (mild): Secondary | ICD-10-CM | POA: Diagnosis not present

## 2022-02-15 DIAGNOSIS — I4891 Unspecified atrial fibrillation: Secondary | ICD-10-CM | POA: Diagnosis not present

## 2022-02-15 LAB — POCT RAPID STREP A, ED / UC: Streptococcus, Group A Screen (Direct): NEGATIVE

## 2022-02-15 NOTE — ED Provider Notes (Signed)
Erma    CSN: [id number] Arrival date & time: 02/15/22  1057      History   Chief Complaint Chief Complaint  Patient presents with   URI    HPI Molly Meyer is a 88 y.o. female.    URI Here for a 1 day history of sore throat, left ear pain, and some congestion in her nose and sinuses.  No fever or chills noted.  No vomiting or diarrhea.  No new shortness of breath, but she does sometimes experience shortness of breath in the long term.  Past medical history is significant for atrial fibrillation, and she is on Eliquis and diltiazem.  She also takes lisinopril HCT for hypertension  Past Medical History:  Diagnosis Date   Degenerative disc disease, lumbar    Diabetes mellitus without complication ([place])    Hypertension     Patient Active Problem List   Diagnosis Date Noted   Cellulitis 01/01/2022   Atrial fibrillation with rapid ventricular response ([place]) 12/31/2021   Vitamin B12 deficiency 12/28/2021   Abnormal gait due to peripheral sensory disorder 08/21/2021   Lumbar radiculopathy 08/21/2021   Hypertension 08/06/2021   Diabetes mellitus without complication ([place]) 46/50/3546   Degenerative disc disease, lumbar 08/06/2021   Screening for malignant neoplasm of colon 03/20/2021   Diverticular disease of colon 12/16/2020   Family history of malignant neoplasm of gastrointestinal tract 12/16/2020   Urge incontinence of urine 06/16/2020   Dyslipidemia 09/14/2019   AAA (abdominal aortic aneurysm) without rupture 10/16/2018   Type 2 diabetes mellitus with chronic kidney disease ([place]) 09/15/2018   Benign hypertension with CKD (chronic kidney disease), stage II 09/15/2018   Nephropathy 09/15/2018   Onychomycosis 01/01/2015   Subluxation of foot, acquired 01/01/2015   Pain in lower limb 01/01/2015   Diabetic neuropathy associated with diabetes mellitus due to underlying condition ([place]) 01/01/2015   Disorder of kidney 07/21/2014   Malaise and fatigue  05/05/2014   Disorder of nasal cavity 04/28/2014   Acute upper respiratory infection 04/28/2014   Seborrheic keratosis 10/18/2013   Diabetic kidney ([place]) 10/18/2013   Benign hypertensive kidney disease 10/18/2013   Chronic kidney disease (CKD), stage II (mild) 10/18/2013   Hypercholesterolemia without hypertriglyceridemia 10/18/2013   Disorder of bone and articular cartilage 10/18/2013   Arthralgia of multiple joints 10/18/2013   Frank hematuria 10/18/2013   Overweight (BMI 25.0-29.9) 10/17/2013    Past Surgical History:  Procedure Laterality Date   KNEE SURGERY      OB History   No obstetric history on file.      Home Medications    Prior to Admission medications   Medication Sig Start Date End Date Taking? Authorizing Provider  acetaminophen (TYLENOL) 500 MG tablet Take 500 mg by mouth every 6 (six) hours as needed for moderate pain. 06/20/15   [provider]  apixaban (ELIQUIS) 5 MG TABS tablet Take 1 tablet (5 mg total) by mouth 2 (two) times daily. 01/02/22   Mariel Aloe, MD  Cholecalciferol (VITAMIN D) 50 MCG (2000 UT) tablet Take 2,000 Units by mouth daily.    [provider]  cyanocobalamin (,VITAMIN B-12,) 1000 MCG/ML injection Inject 1,000 mcg into the muscle See admin instructions. Every 6 or 8 week Patient not taking: Reported on 01/28/2022    [provider]  diltiazem (CARDIZEM CD) 180 MG 24 hr capsule Take 1 capsule (180 mg total) by mouth daily. 01/28/22 04/28/22  [name], MD  linaclotide Rolan Lipa) 72 MCG capsule Take  1 capsule (72 mcg total) by mouth daily before breakfast. Patient taking differently: Take 72 mcg by mouth daily as needed (irritable bowel syndrome). 12/03/20   [name], MD  lisinopril-hydrochlorothiazide (ZESTORETIC) 10-12.5 MG tablet Take 1 tablet by mouth daily. 08/26/21   [name], MD  Propylene Glycol (SYSTANE COMPLETE OP) Place 1 drop into both eyes daily as needed (dry eyes).    [provider]  rosuvastatin (CRESTOR) 20 MG tablet Take 1 tablet (20 mg total) by mouth daily. 01/20/22 04/20/22  Lewayne Bunting, MD    Family History Family History  Problem Relation Age of Onset   Hyperlipidemia Father     Social History Social History   Tobacco Use   Smoking status: Never   Smokeless tobacco: Never  Vaping Use   Vaping Use: Never used  Substance Use Topics   Alcohol use: No    Alcohol/week: 0.0 standard drinks   Drug use: Never     Allergies   Penicillins   Review of Systems Review of Systems   Physical Exam Triage Vital Signs ED Triage Vitals  Enc Vitals Group     BP 02/15/22 1254 111/76     Pulse Rate 02/15/22 1254 (!) 101     Resp 02/15/22 1254 18     Temp 02/15/22 1254 98.1 F (36.7 C)     Temp Source 02/15/22 1254 Oral     SpO2 02/15/22 1254 98 %     Weight 02/15/22 1253 157 lb 9.6 oz (71.5 kg)     Height 02/15/22 1253 5' 4.2" (1.631 m)     Head Circumference --      Peak Flow --      Pain Score 02/15/22 1252 0     Pain Loc --      Pain Edu? --      Excl. in GC? --    No data found.  Updated Vital Signs BP 111/76 (BP Location: Right Arm)    Pulse (!) 101    Temp 98.1 F (36.7 C) (Oral)    Resp 18    Ht 5' 4.2" (1.631 m)    Wt 71.5 kg    SpO2 98%    BMI 26.88 kg/m   Visual Acuity Right Eye Distance:   Left Eye Distance:   Bilateral Distance:    Right Eye Near:   Left Eye Near:    Bilateral Near:     Physical Exam Vitals reviewed.  Constitutional:      General: She is not in acute distress.    Appearance: She is not toxic-appearing.  HENT:     Left Ear: Tympanic membrane normal.     Ears:     Comments: There is some cerumen in canal, but TM visible is gray    Nose: Nose normal.     Mouth/Throat:     Mouth: Mucous membranes are moist.     Comments: Some mild erythema and yellow dc in the posterior OP  Eyes:     Extraocular Movements: Extraocular movements intact.     Conjunctiva/sclera: Conjunctivae normal.     Pupils:  Pupils are equal, round, and reactive to light.  Cardiovascular:     Rate and Rhythm: Normal rate. Rhythm irregular.     Heart sounds: No murmur heard. Pulmonary:     Effort: Pulmonary effort is normal. No respiratory distress.     Breath sounds: No wheezing, rhonchi or rales.  Chest:     Chest wall: No tenderness.  Musculoskeletal:     Cervical back: Neck supple.  Lymphadenopathy:     Cervical: No cervical adenopathy.  Skin:    Capillary Refill: Capillary refill takes less than 2 seconds.     Coloration: Skin is not jaundiced or pale.  Neurological:     General: No focal deficit present.     Mental Status: She is alert and oriented to person, place, and time.  Psychiatric:        Behavior: Behavior normal.     UC Treatments / Results  Labs (all labs ordered are listed, but only abnormal results are displayed) Labs Reviewed  CULTURE, GROUP A STREP [facility])  POCT RAPID STREP A, ED / UC    EKG   Radiology No results found.  Procedures Procedures (including critical care time)  Medications Ordered in UC Medications - No data to display  Initial Impression / Assessment and Plan / UC Course  I have reviewed the triage vital signs and the nursing notes.  Pertinent labs & imaging results that were available during my care of the patient were reviewed by me and considered in my medical decision making (see chart for details).     Rapid strep is negative.  Throat culture will be sent.  We will do COVID swab.  She is at high risk for severe COVID infection, and recent lab work in [place] shows her to have normal renal function with a creatinine of 0.84 and an EGFR of over 60.  If she is COVID-positive, she should have a prescription for Molnupiravir (there is an interaction with eliquis) Final Clinical Impressions(s) / UC Diagnoses   Final diagnoses:  Viral upper respiratory tract infection     Discharge Instructions      Your strep test was negative, and throat culture  has been sent to ensure that you do not need antibiotics for strep.   You have been swabbed for COVID, and the test will result in the next 24 hours. Our staff will call you if positive. If the test is positive, you should quarantine for 5 days.        ED Prescriptions   None    PDMP not reviewed this encounter.   Barrett Henle, MD 02/15/22 1350

## 2022-02-15 NOTE — Discharge Instructions (Addendum)
Your strep test was negative, and throat culture has been sent to ensure that you do not need antibiotics for strep.   You have been swabbed for COVID, and the test will result in the next 24 hours. Our staff will call you if positive. If the test is positive, you should quarantine for 5 days.

## 2022-02-15 NOTE — ED Triage Notes (Signed)
Pt reports sore throat, left ear pain and nasal congestion x 1 day.

## 2022-02-16 ENCOUNTER — Encounter: Payer: Self-pay | Admitting: Internal Medicine

## 2022-02-16 ENCOUNTER — Telehealth (INDEPENDENT_AMBULATORY_CARE_PROVIDER_SITE_OTHER): Payer: Medicare Other | Admitting: Internal Medicine

## 2022-02-16 ENCOUNTER — Telehealth (HOSPITAL_COMMUNITY): Payer: Self-pay | Admitting: Emergency Medicine

## 2022-02-16 ENCOUNTER — Ambulatory Visit: Payer: Medicare Other | Admitting: Internal Medicine

## 2022-02-16 DIAGNOSIS — U071 COVID-19: Secondary | ICD-10-CM | POA: Diagnosis not present

## 2022-02-16 LAB — SARS CORONAVIRUS 2 (TAT 6-24 HRS): SARS Coronavirus 2: POSITIVE — AB

## 2022-02-16 MED ORDER — MOLNUPIRAVIR EUA 200MG CAPSULE: 4.0000 | ORAL_CAPSULE | Freq: Two times a day (BID) | ORAL | 0 refills | Status: AC

## 2022-02-16 NOTE — Progress Notes (Addendum)
Virtual Visit via Phone   This visit type was conducted due to national recommendations for restrictions regarding the COVID-19 Pandemic (e.g. social distancing) in an effort to limit this patient's exposure and mitigate transmission in our community.  Due to her co-morbid illnesses, this patient is at least at moderate risk for complications without adequate follow up.  This format is felt to be most appropriate for this patient at this time.  All issues noted in this document were discussed and addressed.      This visit type was conducted due to national recommendations for restrictions regarding the COVID-19 Pandemic (e.g. social distancing) in an effort to limit this patient's exposure and mitigate transmission in our community.  Patients identity confirmed using two different identifiers.  This format is felt to be most appropriate for this patient at this time.  All issues noted in this document were discussed and addressed.  No physical exam was performed (except for noted visual exam findings with Video Visits).    Date:  02/19/2022   ID:  Molly Meyer, DOB Nov 22, 1924, MRN [id number]  Patient Location:  Home  Provider location:   Office  Chief Complaint:  "I feel lousy"  History of Present Illness:    Molly Meyer is a 88 y.o. female who presents via phone conferencing for a telehealth visit today.    The patient does have symptoms concerning for COVID-19 infection (fever, chills, cough, or new shortness of breath).   She presents today for virtual visit. She prefers this method of contact due to COVID-19 pandemic.  She states she developed sore throat and left ear pain on Sunday. Other sx included sinus headache and runny nose. No fever.  She felt worse on Monday, so her daughter took her to urgent care. She was swabbed for COVID, and diagnosed with viral infection.        Past Medical History:  Diagnosis Date   Degenerative disc disease, lumbar    Diabetes mellitus  without complication ([place])    Hypertension    Past Surgical History:  Procedure Laterality Date   KNEE SURGERY       Current Meds  Medication Sig   acetaminophen (TYLENOL) 500 MG tablet Take 500 mg by mouth every 6 (six) hours as needed for moderate pain.   apixaban (ELIQUIS) 5 MG TABS tablet Take 1 tablet (5 mg total) by mouth 2 (two) times daily.   Cholecalciferol (VITAMIN D) 50 MCG (2000 UT) tablet Take 2,000 Units by mouth daily.   cyanocobalamin (,VITAMIN B-12,) 1000 MCG/ML injection Inject 1,000 mcg into the muscle See admin instructions. Every 6 or 8 week   diltiazem (CARDIZEM CD) 180 MG 24 hr capsule Take 1 capsule (180 mg total) by mouth daily.   lisinopril-hydrochlorothiazide (ZESTORETIC) 10-12.5 MG tablet Take 1 tablet by mouth daily.   molnupiravir EUA (LAGEVRIO) 200 mg CAPS capsule Take 4 capsules (800 mg total) by mouth 2 (two) times daily for 5 days.   Propylene Glycol (SYSTANE COMPLETE OP) Place 1 drop into both eyes daily as needed (dry eyes).   rosuvastatin (CRESTOR) 20 MG tablet Take 1 tablet (20 mg total) by mouth daily.     Allergies:   Penicillins   Social History   Tobacco Use   Smoking status: Never   Smokeless tobacco: Never  Vaping Use   Vaping Use: Never used  Substance Use Topics   Alcohol use: No    Alcohol/week: 0.0 standard drinks   Drug use: Never  Family Hx: The patient's family history includes Hyperlipidemia in her father.  ROS:   Please see the history of present illness.    Review of Systems  Constitutional:  Positive for malaise/fatigue. Negative for fever.  HENT:  Positive for congestion.   Respiratory:  Positive for cough.   Cardiovascular: Negative.   Neurological:  Positive for headaches.  Psychiatric/Behavioral: Negative.     All other systems reviewed and are negative.   Labs/Other Tests and Data Reviewed:    Recent Labs: 12/31/2021: ALT 9; BUN 16; Creatinine, Ser 0.84; Magnesium 2.0; Potassium 4.0; Sodium  139 01/02/2022: Hemoglobin 14.0; Platelets 157; TSH 2.443   Recent Lipid Panel Lab Results  Component Value Date/Time   CHOL 133 01/28/2022 03:43 PM   TRIG 84 01/28/2022 03:43 PM   HDL 56 01/28/2022 03:43 PM   CHOLHDL 2.4 01/28/2022 03:43 PM   LDLCALC 61 01/28/2022 03:43 PM    Wt Readings from Last 3 Encounters:  02/15/22 157 lb 9.6 oz (71.5 kg)  01/28/22 157 lb 9.6 oz (71.5 kg)  01/18/22 157 lb 6.4 oz (71.4 kg)     Exam:    Vital Signs:  There were no vitals taken for this visit.    Physical Exam Vitals and nursing note reviewed.  Pulmonary:     Effort: Pulmonary effort is normal.  Neurological:     Mental Status: She is alert.    ASSESSMENT & PLAN:     1. COVID-19  She was seen yesterday at Urgent Care, COVID test is positive today. I will send rx molnupiravir. Possible side effects were discussed with the patient. Advised patient to take Vitamin C, D, Zinc.  Keep yourself hydrated with a lot of water and rest. Take Delsym for cough and Mucinex as needed. Take Tylenol or pain reliever every 4-6 hours as needed for pain/fever/body ache. If you have elevated blood pressure, you can take OTC Coricidin. You can also take OTC Oscillococcinum, a homeopathic remedy,  to help with your symptoms.  Educated patient that if symptoms get worse or if he/she experiences any SOB, chest pain or pain in their legs to seek immediate emergency care. Continue to monitor your oxygen levels. Call office ASAP if you have any questions. Quarantine for 5 days if tested positive and no symptoms or 10 days if tested positive and you are with symptoms. Wear a mask around other people.    COVID-19 Education: The signs and symptoms of COVID-19 were discussed with the patient and how to seek care for testing (follow up with PCP or arrange E-visit).  The importance of social distancing was discussed today.  Patient Risk:   After full review of this patients clinical status, I feel that they are at least  moderate risk at this time.  Time:   Today, I have spent 15 minutes with the patient with telehealth technology discussing above diagnoses.     Medication Adjustments/Labs and Tests Ordered: Current medicines are reviewed at length with the patient today.  Concerns regarding medicines are outlined above.   Tests Ordered: No orders of the defined types were placed in this encounter.   Medication Changes: Meds ordered this encounter  Medications   molnupiravir EUA (LAGEVRIO) 200 mg CAPS capsule    Sig: Take 4 capsules (800 mg total) by mouth 2 (two) times daily for 5 days.    Dispense:  40 capsule    Refill:  0    Disposition:  Follow up in 2 week(s)  Signed, Theda Belfast  [facility], MD

## 2022-02-16 NOTE — Addendum Note (Signed)
Addended by: Maximino Greenland on: 02/16/2022 07:19 PM   Modules accepted: Orders

## 2022-02-16 NOTE — Patient Instructions (Signed)
COVID-19: Quarantine and Isolation °Quarantine °If you were exposed °Quarantine and stay away from others when you have been in close contact with someone who has COVID-19. °Isolate °If you are sick or test positive °Isolate when you are sick or when you have COVID-19, even if you don't have symptoms. °When to stay home °Calculating quarantine °The date of your exposure is considered day 0. Day 1 is the first full day after your last contact with a person who has had COVID-19. Stay home and away from other people for at least 5 days. Learn why CDC updated guidance for the general public. °IF YOU were exposed to COVID-19 and are NOT  °up to dateIF YOU were exposed to COVID-19 and are NOT on COVID-19 vaccinations °Quarantine for at least 5 days °Stay home °Stay home and quarantine for at least 5 full days. °Wear a well-fitting mask if you must be around others in your home. °Do not travel. °Get tested °Even if you don't develop symptoms, get tested at least 5 days after you last had close contact with someone with COVID-19. °After quarantine °Watch for symptoms °Watch for symptoms until 10 days after you last had close contact with someone with COVID-19. °Avoid travel °It is best to avoid travel until a full 10 days after you last had close contact with someone with COVID-19. °If you develop symptoms °Isolate immediately and get tested. Continue to stay home until you know the results. Wear a well-fitting mask around others. °Take precautions until day 10 °Wear a well-fitting mask °Wear a well-fitting mask for 10 full days any time you are around others inside your home or in public. Do not go to places where you are unable to wear a well-fitting mask. °If you must travel during days 6-10, take precautions. °Avoid being around people who are more likely to get very sick from COVID-19. °IF YOU were exposed to COVID-19 and are  °up to dateIF YOU were exposed to COVID-19 and are on COVID-19 vaccinations °No  quarantine °You do not need to stay home unless you develop symptoms. °Get tested °Even if you don't develop symptoms, get tested at least 5 days after you last had close contact with someone with COVID-19. °Watch for symptoms °Watch for symptoms until 10 days after you last had close contact with someone with COVID-19. °If you develop symptoms °Isolate immediately and get tested. Continue to stay home until you know the results. Wear a well-fitting mask around others. °Take precautions until day 10 °Wear a well-fitting mask °Wear a well-fitting mask for 10 full days any time you are around others inside your home or in public. Do not go to places where you are unable to wear a well-fitting mask. °Take precautions if traveling °Avoid being around people who are more likely to get very sick from COVID-19. °IF YOU were exposed to COVID-19 and had confirmed COVID-19 within the past 90 days (you tested positive using a viral test) °No quarantine °You do not need to stay home unless you develop symptoms. °Watch for symptoms °Watch for symptoms until 10 days after you last had close contact with someone with COVID-19. °If you develop symptoms °Isolate immediately and get tested. Continue to stay home until you know the results. Wear a well-fitting mask around others. °Take precautions until day 10 °Wear a well-fitting mask °Wear a well-fitting mask for 10 full days any time you are around others inside your home or in public. Do not go to places where you are   unable to wear a well-fitting mask. °Take precautions if traveling °Avoid being around people who are more likely to get very sick from COVID-19. °Calculating isolation °Day 0 is your first day of symptoms or a positive viral test. Day 1 is the first full day after your symptoms developed or your test specimen was collected. If you have COVID-19 or have symptoms, isolate for at least 5 days. °IF YOU tested positive for COVID-19 or have symptoms, regardless of  vaccination status °Stay home for at least 5 days °Stay home for 5 days and isolate from others in your home. °Wear a well-fitting mask if you must be around others in your home. °Do not travel. °Ending isolation if you had symptoms °End isolation after 5 full days if you are fever-free for 24 hours (without the use of fever-reducing medication) and your symptoms are improving. °Ending isolation if you did NOT have symptoms °End isolation after at least 5 full days after your positive test. °If you got very sick from COVID-19 or have a weakened immune system °You should isolate for at least 10 days. Consult your doctor before ending isolation. °Take precautions until day 10 °Wear a well-fitting mask °Wear a well-fitting mask for 10 full days any time you are around others inside your home or in public. Do not go to places where you are unable to wear a well-fitting mask. °Do not travel °Do not travel until a full 10 days after your symptoms started or the date your positive test was taken if you had no symptoms. °Avoid being around people who are more likely to get very sick from COVID-19. °Definitions °Exposure °Contact with someone infected with SARS-CoV-2, the virus that causes COVID-19, in a way that increases the likelihood of getting infected with the virus. °Close contact °A close contact is someone who was less than 6 feet away from an infected person (laboratory-confirmed or a clinical diagnosis) for a cumulative total of 15 minutes or more over a 24-hour period. For example, three individual 5-minute exposures for a total of 15 minutes. People who are exposed to someone with COVID-19 after they completed at least 5 days of isolation are not considered close contacts. °Quarantine °Quarantine is a strategy used to prevent transmission of COVID-19 by keeping people who have been in close contact with someone with COVID-19 apart from others. °Who does not need to quarantine? °If you had close contact with  someone with COVID-19 and you are in one of the following groups, you do not need to quarantine. °You are up to date with your COVID-19 vaccines. °You had confirmed COVID-19 within the last 90 days (meaning you tested positive using a viral test). °If you are up to date with COVID-19 vaccines, you should wear a well-fitting mask around others for 10 days from the date of your last close contact with someone with COVID-19 (the date of last close contact is considered day 0). Get tested at least 5 days after you last had close contact with someone with COVID-19. If you test positive or develop COVID-19 symptoms, isolate from other people and follow recommendations in the Isolation section below. If you tested positive for COVID-19 with a viral test within the previous 90 days and subsequently recovered and remain without COVID-19 symptoms, you do not need to quarantine or get tested after close contact. You should wear a well-fitting mask around others for 10 days from the date of your last close contact with someone with COVID-19 (the date of last   close contact is considered day 0). If you have COVID-19 symptoms, get tested and isolate from other people and follow recommendations in the Isolation section below. °Who should quarantine? °If you come into close contact with someone with COVID-19, you should quarantine if you are not up to date on COVID-19 vaccines. This includes people who are not vaccinated. °What to do for quarantine °Stay home and away from other people for at least 5 days (day 0 through day 5) after your last contact with a person who has COVID-19. The date of your exposure is considered day 0. Wear a well-fitting mask when around others at home, if possible. °For 10 days after your last close contact with someone with COVID-19, watch for fever (100.4°F or greater), cough, shortness of breath, or other COVID-19 symptoms. °If you develop symptoms, get tested immediately and isolate until you receive  your test results. If you test positive, follow isolation recommendations. °If you do not develop symptoms, get tested at least 5 days after you last had close contact with someone with COVID-19. °If you test negative, you can leave your home, but continue to wear a well-fitting mask when around others at home and in public until 10 days after your last close contact with someone with COVID-19. °If you test positive, you should isolate for at least 5 days from the date of your positive test (if you do not have symptoms). If you do develop COVID-19 symptoms, isolate for at least 5 days from the date your symptoms began (the date the symptoms started is day 0). Follow recommendations in the isolation section below. °If you are unable to get a test 5 days after last close contact with someone with COVID-19, you can leave your home after day 5 if you have been without COVID-19 symptoms throughout the 5-day period. Wear a well-fitting mask for 10 days after your date of last close contact when around others at home and in public. °Avoid people who are have weakened immune systems or are more likely to get very sick from COVID-19, and nursing homes and other high-risk settings, until after at least 10 days. °If possible, stay away from people you live with, especially people who are at higher risk for getting very sick from COVID-19, as well as others outside your home throughout the full 10 days after your last close contact with someone with COVID-19. °If you are unable to quarantine, you should wear a well-fitting mask for 10 days when around others at home and in public. °If you are unable to wear a mask when around others, you should continue to quarantine for 10 days. Avoid people who have weakened immune systems or are more likely to get very sick from COVID-19, and nursing homes and other high-risk settings, until after at least 10 days. °See additional information about travel. °Do not go to places where you are  unable to wear a mask, such as restaurants and some gyms, and avoid eating around others at home and at work until after 10 days after your last close contact with someone with COVID-19. °After quarantine °Watch for symptoms until 10 days after your last close contact with someone with COVID-19. °If you have symptoms, isolate immediately and get tested. °Quarantine in high-risk congregate settings °In certain congregate settings that have high risk of secondary transmission (such as correctional and detention facilities, homeless shelters, or cruise ships), CDC recommends a 10-day quarantine for residents, regardless of vaccination and booster status. During periods of critical staffing   shortages, facilities may consider shortening the quarantine period for staff to ensure continuity of operations. Decisions to shorten quarantine in these settings should be made in consultation with state, local, tribal, or territorial health departments and should take into consideration the context and characteristics of the facility. CDC's setting-specific guidance provides additional recommendations for these settings. °Isolation °Isolation is used to separate people with confirmed or suspected COVID-19 from those without COVID-19. People who are in isolation should stay home until it's safe for them to be around others. At home, anyone sick or infected should separate from others, or wear a well-fitting mask when they need to be around others. People in isolation should stay in a specific "sick room" or area and use a separate bathroom if available. Everyone who has presumed or confirmed COVID-19 should stay home and isolate from other people for at least 5 full days (day 0 is the first day of symptoms or the date of the day of the positive viral test for asymptomatic persons). They should wear a mask when around others at home and in public for an additional 5 days. People who are confirmed to have COVID-19 or are showing  symptoms of COVID-19 need to isolate regardless of their vaccination status. This includes: °People who have a positive viral test for COVID-19, regardless of whether or not they have symptoms. °People with symptoms of COVID-19, including people who are awaiting test results or have not been tested. People with symptoms should isolate even if they do not know if they have been in close contact with someone with COVID-19. °What to do for isolation °Monitor your symptoms. If you have an emergency warning sign (including trouble breathing), seek emergency medical care immediately. °Stay in a separate room from other household members, if possible. °Use a separate bathroom, if possible. °Take steps to improve ventilation at home, if possible. °Avoid contact with other members of the household and pets. °Don't share personal household items, like cups, towels, and utensils. °Wear a well-fitting mask when you need to be around other people. °Learn more about what to do if you are sick and how to notify your contacts. °Ending isolation for people who had COVID-19 and had symptoms °If you had COVID-19 and had symptoms, isolate for at least 5 days. To calculate your 5-day isolation period, day 0 is your first day of symptoms. Day 1 is the first full day after your symptoms developed. You can leave isolation after 5 full days. °You can end isolation after 5 full days if you are fever-free for 24 hours without the use of fever-reducing medication and your other symptoms have improved (Loss of taste and smell may persist for weeks or months after recovery and need not delay the end of isolation). °You should continue to wear a well-fitting mask around others at home and in public for 5 additional days (day 6 through day 10) after the end of your 5-day isolation period. If you are unable to wear a mask when around others, you should continue to isolate for a full 10 days. Avoid people who have weakened immune systems or are more  likely to get very sick from COVID-19, and nursing homes and other high-risk settings, until after at least 10 days. °If you continue to have fever or your other symptoms have not improved after 5 days of isolation, you should wait to end your isolation until you are fever-free for 24 hours without the use of fever-reducing medication and your other symptoms have improved.   Continue to wear a well-fitting mask through day 10. Contact your healthcare provider if you have questions. °See additional information about travel. °Do not go to places where you are unable to wear a mask, such as restaurants and some gyms, and avoid eating around others at home and at work until a full 10 days after your first day of symptoms. °If an individual has access to a test and wants to test, the best approach is to use an antigen test1 towards the end of the 5-day isolation period. Collect the test sample only if you are fever-free for 24 hours without the use of fever-reducing medication and your other symptoms have improved (loss of taste and smell may persist for weeks or months after recovery and need not delay the end of isolation). If your test result is positive, you should continue to isolate until day 10. If your test result is negative, you can end isolation, but continue to wear a well-fitting mask around others at home and in public until day 10. Follow additional recommendations for masking and avoiding travel as described above. °1As noted in the labeling for authorized over-the counter antigen tests: Negative results should be treated as presumptive. Negative results do not rule out SARS-CoV-2 infection and should not be used as the sole basis for treatment or patient management decisions, including infection control decisions. To improve results, antigen tests should be used twice over a three-day period with at least 24 hours and no more than 48 hours between tests. °Note that these recommendations on ending isolation  do not apply to people who are moderately ill or very sick from COVID-19 or have weakened immune systems. See section below for recommendations for when to end isolation for these groups. °Ending isolation for people who tested positive for COVID-19 but had no symptoms °If you test positive for COVID-19 and never develop symptoms, isolate for at least 5 days. Day 0 is the day of your positive viral test (based on the date you were tested) and day 1 is the first full day after the specimen was collected for your positive test. You can leave isolation after 5 full days. °If you continue to have no symptoms, you can end isolation after at least 5 days. °You should continue to wear a well-fitting mask around others at home and in public until day 10 (day 6 through day 10). If you are unable to wear a mask when around others, you should continue to isolate for 10 days. Avoid people who have weakened immune systems or are more likely to get very sick from COVID-19, and nursing homes and other high-risk settings, until after at least 10 days. °If you develop symptoms after testing positive, your 5-day isolation period should start over. Day 0 is your first day of symptoms. Follow the recommendations above for ending isolation for people who had COVID-19 and had symptoms. °See additional information about travel. °Do not go to places where you are unable to wear a mask, such as restaurants and some gyms, and avoid eating around others at home and at work until 10 days after the day of your positive test. °If an individual has access to a test and wants to test, the best approach is to use an antigen test1 towards the end of the 5-day isolation period. If your test result is positive, you should continue to isolate until day 10. If your test result is positive, you can also choose to test daily and if your test result   is negative, you can end isolation, but continue to wear a well-fitting mask around others at home and in  public until day 10. Follow additional recommendations for masking and avoiding travel as described above. °1As noted in the labeling for authorized over-the counter antigen tests: Negative results should be treated as presumptive. Negative results do not rule out SARS-CoV-2 infection and should not be used as the sole basis for treatment or patient management decisions, including infection control decisions. To improve results, antigen tests should be used twice over a three-day period with at least 24 hours and no more than 48 hours between tests. °Ending isolation for people who were moderately or very sick from COVID-19 or have a weakened immune system °People who are moderately ill from COVID-19 (experiencing symptoms that affect the lungs like shortness of breath or difficulty breathing) should isolate for 10 days and follow all other isolation precautions. To calculate your 10-day isolation period, day 0 is your first day of symptoms. Day 1 is the first full day after your symptoms developed. If you are unsure if your symptoms are moderate, talk to a healthcare provider for further guidance. °People who are very sick from COVID-19 (this means people who were hospitalized or required intensive care or ventilation support) and people who have weakened immune systems might need to isolate at home longer. They may also require testing with a viral test to determine when they can be around others. CDC recommends an isolation period of at least 10 and up to 20 days for people who were very sick from COVID-19 and for people with weakened immune systems. Consult with your healthcare provider about when you can resume being around other people. If you are unsure if your symptoms are severe or if you have a weakened immune system, talk to a healthcare provider for further guidance. °People who have a weakened immune system should talk to their healthcare provider about the potential for reduced immune responses to  COVID-19 vaccines and the need to continue to follow current prevention measures (including wearing a well-fitting mask and avoiding crowds and poorly ventilated indoor spaces) to protect themselves against COVID-19 until advised otherwise by their healthcare provider. Close contacts of immunocompromised people--including household members--should also be encouraged to receive all recommended COVID-19 vaccine doses to help protect these people. °Isolation in high-risk congregate settings °In certain high-risk congregate settings that have high risk of secondary transmission and where it is not feasible to cohort people (such as correctional and detention facilities, homeless shelters, and cruise ships), CDC recommends a 10-day isolation period for residents. During periods of critical staffing shortages, facilities may consider shortening the isolation period for staff to ensure continuity of operations. Decisions to shorten isolation in these settings should be made in consultation with state, local, tribal, or territorial health departments and should take into consideration the context and characteristics of the facility. CDC's setting-specific guidance provides additional recommendations for these settings. °This CDC guidance is meant to supplement--not replace--any federal, state, local, territorial, or tribal health and safety laws, rules, and regulations. °Recommendations for specific settings °These recommendations do not apply to healthcare professionals. For guidance specific to these settings, see °Healthcare professionals: Interim Guidance for Managing Healthcare Personnel with SARS-CoV-2 Infection or Exposure to SARS-CoV-2 °Patients, residents, and visitors to healthcare settings: Interim Infection Prevention and Control Recommendations for Healthcare Personnel During the Coronavirus Disease 2019 (COVID-19) Pandemic °Additional setting-specific guidance and recommendations are available. °These  recommendations on quarantine and isolation do apply to K[employer]12 School   settings. Additional guidance is available here: Overview of COVID-19 Quarantine for K-12 Schools °Travelers: Travel information and recommendations °Congregate facilities and other settings: guidance pages for community, work, and school settings °Ongoing COVID-19 exposure FAQs °I live with someone with COVID-19, but I cannot be separated from them. How do we manage quarantine in this situation? °It is very important for people with COVID-19 to remain apart from other people, if possible, even if they are living together. If separation of the person with COVID-19 from others that they live with is not possible, the other people that they live with will have ongoing exposure, meaning they will be repeatedly exposed until that person is no longer able to spread the virus to other people. In this situation, there are precautions you can take to limit the spread of COVID-19: °The person with COVID-19 and everyone they live with should wear a well-fitting mask inside the home. °If possible, one person should care for the person with COVID-19 to limit the number of people who are in close contact with the infected person. °Take steps to protect yourself and others to reduce transmission in the home: °Quarantine if you are not up to date with your COVID-19 vaccines. °Isolate if you are sick or tested positive for COVID-19, even if you don't have symptoms. °Learn more about the public health recommendations for testing, mask use and quarantine of close contacts, like yourself, who have ongoing exposure. These recommendations differ depending on your vaccination status. °What should I do if I have ongoing exposure to COVID-19 from someone I live with? °Recommendations for this situation depend on your vaccination status: °If you are not up to date on COVID-19 vaccines and have ongoing exposure to COVID-19, you should: °Begin quarantine immediately and  continue to quarantine throughout the isolation period of the person with COVID-19. °Continue to quarantine for an additional 5 days starting the day after the end of isolation for the person with COVID-19. °Get tested at least 5 days after the end of isolation of the infected person that lives with them. °If you test negative, you can leave the home but should continue to wear a well-fitting mask when around others at home and in public until 10 days after the end of isolation for the person with COVID-19. °Isolate immediately if you develop symptoms of COVID-19 or test positive. °If you are up to date with COVID-19 vaccines and have ongoing exposure to COVID-19, you should: °Get tested at least 5 days after your first exposure. A person with COVID-19 is considered infectious starting 2 days before they develop symptoms, or 2 days before the date of their positive test if they do not have symptoms. °Get tested again at least 5 days after the end of isolation for the person with COVID-19. °Wear a well-fitting mask when you are around the person with COVID-19, and do this throughout their isolation period. °Wear a well-fitting mask around others for 10 days after the infected person's isolation period ends. °Isolate immediately if you develop symptoms of COVID-19 or test positive. °What should I do if multiple people I live with test positive for COVID-19 at different times? °Recommendations for this situation depend on your vaccination status: °If you are not up to date with your COVID-19 vaccines, you should: °Quarantine throughout the isolation period of any infected person that you live with. °Continue to quarantine until 5 days after the end of isolation date for the most recently infected person that lives with you. For example, if   the last day of isolation of the person most recently infected with COVID-19 was June 30, the new 5-day quarantine period starts on July 1. °Get tested at least 5 days after the end  of isolation for the most recently infected person that lives with you. °Wear a well-fitting mask when you are around any person with COVID-19 while that person is in isolation. °Wear a well-fitting mask when you are around other people until 10 days after your last close contact. °Isolate immediately if you develop symptoms of COVID-19 or test positive. °If you are up to date with your COVID-19 vaccines, you should: °Get tested at least 5 days after your first exposure. A person with COVID-19 is considered infectious starting 2 days before they developed symptoms, or 2 days before the date of their positive test if they do not have symptoms. °Get tested again at least 5 days after the end of isolation for the most recently infected person that lives with you. °Wear a well-fitting mask when you are around any person with COVID-19 while that person is in isolation. °Wear a well-fitting mask around others for 10 days after the end of isolation for the most recently infected person that lives with you. For example, if the last day of isolation for the person most recently infected with COVID-19 was June 30, the new 10-day period to wear a well-fitting mask indoors in public starts on July 1. °Isolate immediately if you develop symptoms of COVID-19 or test positive. °I had COVID-19 and completed isolation. Do I have to quarantine or get tested if someone I live with gets COVID-19 shortly after I completed isolation? °No. If you recently completed isolation and someone that lives with you tests positive for the virus that causes COVID-19 shortly after the end of your isolation period, you do not have to quarantine or get tested as long as you do not develop new symptoms. Once all of the people that live together have completed isolation or quarantine, refer to the guidance below for new exposures to COVID-19. °If you had COVID-19 in the previous 90 days and then came into close contact with someone with COVID-19, you do  not have to quarantine or get tested if you do not have symptoms. But you should: °Wear a well-fitting mask indoors in public for 10 days after your last close contact. °Monitor for COVID-19 symptoms for 10 days from the date of your last close contact. °Isolate immediately and get tested if symptoms develop. °If more than 90 days have passed since your recovery from infection, follow CDC's recommendations for close contacts. These recommendations will differ depending on your vaccination status. °03/25/2021 °Content source: National Center for Immunization and Respiratory Diseases (NCIRD), Division of Viral Diseases °This information is not intended to replace advice given to you by your health care provider. Make sure you discuss any questions you have with your health care provider. °Document Revised: 07/29/2021 Document Reviewed: 07/29/2021 °Elsevier Patient Education © 2022 Else[employer] Inc. ° °

## 2022-02-18 DIAGNOSIS — L03116 Cellulitis of left lower limb: Secondary | ICD-10-CM | POA: Diagnosis not present

## 2022-02-18 DIAGNOSIS — S90562D Insect bite (nonvenomous), left ankle, subsequent encounter: Secondary | ICD-10-CM | POA: Diagnosis not present

## 2022-02-18 DIAGNOSIS — E1122 Type 2 diabetes mellitus with diabetic chronic kidney disease: Secondary | ICD-10-CM | POA: Diagnosis not present

## 2022-02-18 DIAGNOSIS — N182 Chronic kidney disease, stage 2 (mild): Secondary | ICD-10-CM | POA: Diagnosis not present

## 2022-02-18 DIAGNOSIS — I129 Hypertensive chronic kidney disease with stage 1 through stage 4 chronic kidney disease, or unspecified chronic kidney disease: Secondary | ICD-10-CM | POA: Diagnosis not present

## 2022-02-18 DIAGNOSIS — I4891 Unspecified atrial fibrillation: Secondary | ICD-10-CM | POA: Diagnosis not present

## 2022-02-18 LAB — CULTURE, GROUP A STREP (THRC)

## 2022-02-23 ENCOUNTER — Telehealth: Payer: Self-pay | Admitting: *Deleted

## 2022-02-23 ENCOUNTER — Telehealth: Payer: Self-pay

## 2022-02-23 ENCOUNTER — Ambulatory Visit: Payer: Medicare Other | Admitting: Cardiology

## 2022-02-23 DIAGNOSIS — L03116 Cellulitis of left lower limb: Secondary | ICD-10-CM | POA: Diagnosis not present

## 2022-02-23 DIAGNOSIS — E1122 Type 2 diabetes mellitus with diabetic chronic kidney disease: Secondary | ICD-10-CM | POA: Diagnosis not present

## 2022-02-23 DIAGNOSIS — S90562D Insect bite (nonvenomous), left ankle, subsequent encounter: Secondary | ICD-10-CM | POA: Diagnosis not present

## 2022-02-23 DIAGNOSIS — N182 Chronic kidney disease, stage 2 (mild): Secondary | ICD-10-CM | POA: Diagnosis not present

## 2022-02-23 DIAGNOSIS — I129 Hypertensive chronic kidney disease with stage 1 through stage 4 chronic kidney disease, or unspecified chronic kidney disease: Secondary | ICD-10-CM | POA: Diagnosis not present

## 2022-02-23 DIAGNOSIS — I4891 Unspecified atrial fibrillation: Secondary | ICD-10-CM | POA: Diagnosis not present

## 2022-02-23 NOTE — Telephone Encounter (Signed)
Z Whicker PT assistant with [facility] called wanted to inform us that pt has fallen but she does not have any injuries just a bruise on her left arm from her son in law helping her up off the floor. [phone]   Provider notified Tyler Deis

## 2022-02-23 NOTE — Telephone Encounter (Signed)
Called patient to review sx of covid due to incomplete BPA covid questionnaire series. No answer, left message to call back at [phone] for further questions or concerns.

## 2022-02-24 ENCOUNTER — Telehealth: Payer: Self-pay

## 2022-02-24 NOTE — Telephone Encounter (Signed)
Pt. Did not complete her COVID 19 questionnaire. Contacted pt. And states "I just filled out a new one today and I'm feeling better." Instructed to continue plan of care and reach out to PCP as needed. Verbalizes understanding. ?

## 2022-02-25 DIAGNOSIS — N182 Chronic kidney disease, stage 2 (mild): Secondary | ICD-10-CM | POA: Diagnosis not present

## 2022-02-25 DIAGNOSIS — E1122 Type 2 diabetes mellitus with diabetic chronic kidney disease: Secondary | ICD-10-CM | POA: Diagnosis not present

## 2022-02-25 DIAGNOSIS — S90562D Insect bite (nonvenomous), left ankle, subsequent encounter: Secondary | ICD-10-CM | POA: Diagnosis not present

## 2022-02-25 DIAGNOSIS — L03116 Cellulitis of left lower limb: Secondary | ICD-10-CM | POA: Diagnosis not present

## 2022-02-25 DIAGNOSIS — I129 Hypertensive chronic kidney disease with stage 1 through stage 4 chronic kidney disease, or unspecified chronic kidney disease: Secondary | ICD-10-CM | POA: Diagnosis not present

## 2022-02-25 DIAGNOSIS — I4891 Unspecified atrial fibrillation: Secondary | ICD-10-CM | POA: Diagnosis not present

## 2022-03-01 DIAGNOSIS — I4891 Unspecified atrial fibrillation: Secondary | ICD-10-CM | POA: Diagnosis not present

## 2022-03-01 DIAGNOSIS — I129 Hypertensive chronic kidney disease with stage 1 through stage 4 chronic kidney disease, or unspecified chronic kidney disease: Secondary | ICD-10-CM | POA: Diagnosis not present

## 2022-03-01 DIAGNOSIS — N182 Chronic kidney disease, stage 2 (mild): Secondary | ICD-10-CM | POA: Diagnosis not present

## 2022-03-01 DIAGNOSIS — L03116 Cellulitis of left lower limb: Secondary | ICD-10-CM | POA: Diagnosis not present

## 2022-03-01 DIAGNOSIS — E1122 Type 2 diabetes mellitus with diabetic chronic kidney disease: Secondary | ICD-10-CM | POA: Diagnosis not present

## 2022-03-01 DIAGNOSIS — S90562D Insect bite (nonvenomous), left ankle, subsequent encounter: Secondary | ICD-10-CM | POA: Diagnosis not present

## 2022-03-02 DIAGNOSIS — E1122 Type 2 diabetes mellitus with diabetic chronic kidney disease: Secondary | ICD-10-CM | POA: Diagnosis not present

## 2022-03-02 DIAGNOSIS — I129 Hypertensive chronic kidney disease with stage 1 through stage 4 chronic kidney disease, or unspecified chronic kidney disease: Secondary | ICD-10-CM | POA: Diagnosis not present

## 2022-03-02 DIAGNOSIS — L03116 Cellulitis of left lower limb: Secondary | ICD-10-CM | POA: Diagnosis not present

## 2022-03-02 DIAGNOSIS — N182 Chronic kidney disease, stage 2 (mild): Secondary | ICD-10-CM | POA: Diagnosis not present

## 2022-03-02 DIAGNOSIS — S90562D Insect bite (nonvenomous), left ankle, subsequent encounter: Secondary | ICD-10-CM | POA: Diagnosis not present

## 2022-03-02 DIAGNOSIS — I4891 Unspecified atrial fibrillation: Secondary | ICD-10-CM | POA: Diagnosis not present

## 2022-03-04 DIAGNOSIS — M5136 Other intervertebral disc degeneration, lumbar region: Secondary | ICD-10-CM | POA: Diagnosis not present

## 2022-03-04 DIAGNOSIS — E1122 Type 2 diabetes mellitus with diabetic chronic kidney disease: Secondary | ICD-10-CM | POA: Diagnosis not present

## 2022-03-04 DIAGNOSIS — Z7901 Long term (current) use of anticoagulants: Secondary | ICD-10-CM | POA: Diagnosis not present

## 2022-03-04 DIAGNOSIS — N182 Chronic kidney disease, stage 2 (mild): Secondary | ICD-10-CM | POA: Diagnosis not present

## 2022-03-04 DIAGNOSIS — I129 Hypertensive chronic kidney disease with stage 1 through stage 4 chronic kidney disease, or unspecified chronic kidney disease: Secondary | ICD-10-CM | POA: Diagnosis not present

## 2022-03-04 DIAGNOSIS — Z9181 History of falling: Secondary | ICD-10-CM | POA: Diagnosis not present

## 2022-03-04 DIAGNOSIS — I4891 Unspecified atrial fibrillation: Secondary | ICD-10-CM | POA: Diagnosis not present

## 2022-03-11 DIAGNOSIS — N182 Chronic kidney disease, stage 2 (mild): Secondary | ICD-10-CM | POA: Diagnosis not present

## 2022-03-11 DIAGNOSIS — M5136 Other intervertebral disc degeneration, lumbar region: Secondary | ICD-10-CM | POA: Diagnosis not present

## 2022-03-11 DIAGNOSIS — Z9181 History of falling: Secondary | ICD-10-CM | POA: Diagnosis not present

## 2022-03-11 DIAGNOSIS — I4891 Unspecified atrial fibrillation: Secondary | ICD-10-CM | POA: Diagnosis not present

## 2022-03-11 DIAGNOSIS — I129 Hypertensive chronic kidney disease with stage 1 through stage 4 chronic kidney disease, or unspecified chronic kidney disease: Secondary | ICD-10-CM | POA: Diagnosis not present

## 2022-03-11 DIAGNOSIS — E1122 Type 2 diabetes mellitus with diabetic chronic kidney disease: Secondary | ICD-10-CM | POA: Diagnosis not present

## 2022-03-15 DIAGNOSIS — E119 Type 2 diabetes mellitus without complications: Secondary | ICD-10-CM | POA: Diagnosis not present

## 2022-03-15 DIAGNOSIS — H04123 Dry eye syndrome of bilateral lacrimal glands: Secondary | ICD-10-CM | POA: Diagnosis not present

## 2022-03-15 DIAGNOSIS — H43813 Vitreous degeneration, bilateral: Secondary | ICD-10-CM | POA: Diagnosis not present

## 2022-03-15 DIAGNOSIS — H524 Presbyopia: Secondary | ICD-10-CM | POA: Diagnosis not present

## 2022-03-15 LAB — HM DIABETES EYE EXAM

## 2022-03-16 DIAGNOSIS — I129 Hypertensive chronic kidney disease with stage 1 through stage 4 chronic kidney disease, or unspecified chronic kidney disease: Secondary | ICD-10-CM | POA: Diagnosis not present

## 2022-03-16 DIAGNOSIS — N182 Chronic kidney disease, stage 2 (mild): Secondary | ICD-10-CM | POA: Diagnosis not present

## 2022-03-16 DIAGNOSIS — I4891 Unspecified atrial fibrillation: Secondary | ICD-10-CM | POA: Diagnosis not present

## 2022-03-16 DIAGNOSIS — Z9181 History of falling: Secondary | ICD-10-CM | POA: Diagnosis not present

## 2022-03-16 DIAGNOSIS — M5136 Other intervertebral disc degeneration, lumbar region: Secondary | ICD-10-CM | POA: Diagnosis not present

## 2022-03-16 DIAGNOSIS — E1122 Type 2 diabetes mellitus with diabetic chronic kidney disease: Secondary | ICD-10-CM | POA: Diagnosis not present

## 2022-03-23 DIAGNOSIS — E1122 Type 2 diabetes mellitus with diabetic chronic kidney disease: Secondary | ICD-10-CM | POA: Diagnosis not present

## 2022-03-23 DIAGNOSIS — I4891 Unspecified atrial fibrillation: Secondary | ICD-10-CM | POA: Diagnosis not present

## 2022-03-23 DIAGNOSIS — I129 Hypertensive chronic kidney disease with stage 1 through stage 4 chronic kidney disease, or unspecified chronic kidney disease: Secondary | ICD-10-CM | POA: Diagnosis not present

## 2022-03-23 DIAGNOSIS — M5136 Other intervertebral disc degeneration, lumbar region: Secondary | ICD-10-CM | POA: Diagnosis not present

## 2022-03-23 DIAGNOSIS — Z9181 History of falling: Secondary | ICD-10-CM | POA: Diagnosis not present

## 2022-03-23 DIAGNOSIS — N182 Chronic kidney disease, stage 2 (mild): Secondary | ICD-10-CM | POA: Diagnosis not present

## 2022-03-29 NOTE — Progress Notes (Addendum)
? ? ? ? ?WJX:[id number] atrial fibrillation.  Abdominal ultrasound June 2020 showed 5.3 x 5.65 cm abdominal aortic aneurysm.  Patient has elected to not pursue any intervention and therefore follow-up studies not performed.  Patient recently admitted with lower extremity cellulitis and noted to be in new onset atrial fibrillation.  She was treated with Cardizem and apixaban.  Echocardiogram 1/23 showed normal LV function, mild biatrial enlargement, mild mitral regurgitation, mild aortic insufficiency and abdominal aortic aneurysm measuring 63 mm.  Patient also with history of diabetes mellitus, hypertension and hyperlipidemia.  Also note she is in no CODE BLUE.  Since she was last seen, she denies dyspnea, chest pain, palpitations or syncope.  No bleeding or falls. ? ?Current Outpatient Medications  ?Medication Sig Dispense Refill  ? acetaminophen (TYLENOL) 500 MG tablet Take 500 mg by mouth every 6 (six) hours as needed for moderate pain.    ? apixaban (ELIQUIS) 5 MG TABS tablet Take 1 tablet (5 mg total) by mouth 2 (two) times daily. 60 tablet 2  ? Cholecalciferol (VITAMIN D) 50 MCG (2000 UT) tablet Take 2,000 Units by mouth daily.    ? cyanocobalamin (,VITAMIN B-12,) 1000 MCG/ML injection Inject 1,000 mcg into the muscle See admin instructions. Every 6 or 8 week    ? diltiazem (CARDIZEM CD) 180 MG 24 hr capsule Take 1 capsule (180 mg total) by mouth daily. 90 capsule 2  ? linaclotide (LINZESS) 72 MCG capsule Take 1 capsule (72 mcg total) by mouth daily before breakfast. 30 capsule 5  ? lisinopril-hydrochlorothiazide (ZESTORETIC) 10-12.5 MG tablet Take 1 tablet by mouth daily. 90 tablet 2  ? Propylene Glycol (SYSTANE COMPLETE OP) Place 1 drop into both eyes daily as needed (dry eyes).    ? rosuvastatin (CRESTOR) 20 MG tablet Take 1 tablet (20 mg total) by mouth daily. 90 tablet 3  ? simvastatin (ZOCOR) 40 MG tablet Take 40 mg by mouth at bedtime.    ? ?No current facility-administered medications for this visit.   ? ? ? ?Past Medical History:  ?Diagnosis Date  ? Degenerative disc disease, lumbar   ? Diabetes mellitus without complication ([place])   ? Hypertension   ? ? ?Past Surgical History:  ?Procedure Laterality Date  ? KNEE SURGERY    ? ? ?Social History  ? ?Socioeconomic History  ? Marital status: Widowed  ?  Spouse name: Not on file  ? Number of children: Not on file  ? Years of education: Not on file  ? Highest education level: Not on file  ?Occupational History  ? Occupation: retired  ?Tobacco Use  ? Smoking status: Never  ? Smokeless tobacco: Never  ?Vaping Use  ? Vaping Use: Never used  ?Substance and Sexual Activity  ? Alcohol use: No  ?  Alcohol/week: 0.0 standard drinks  ? Drug use: Never  ? Sexual activity: Not Currently  ?Other Topics Concern  ? Not on file  ?Social History Narrative  ? Not on file  ? ?Social Determinants of Health  ? ?Financial Resource Strain: Low Risk   ? Difficulty of Paying Living Expenses: Not hard at all  ?Food Insecurity: No Food Insecurity  ? Worried About Charity fundraiser in the Last Year: Never true  ? Ran Out of Food in the Last Year: Never true  ?Transportation Needs: No Transportation Needs  ? Lack of Transportation (Medical): No  ? Lack of Transportation (Non-Medical): No  ?Physical Activity: Inactive  ? Days of Exercise per Week: 0 days  ?  Minutes of Exercise per Session: 0 min  ?Stress: No Stress Concern Present  ? Feeling of Stress : Not at all  ?Social Connections: Not on file  ?Intimate Partner Violence: Not on file  ? ? ?Family History  ?Problem Relation Age of Onset  ? Hyperlipidemia Father   ? ? ?ROS: no fevers or chills, productive cough, hemoptysis, dysphasia, odynophagia, melena, hematochezia, dysuria, hematuria, rash, seizure activity, orthopnea, PND, pedal edema, claudication. Remaining systems are negative. ? ?Physical Exam: ?Well-developed well-nourished in no acute distress.  ?Skin is warm and dry.  ?HEENT is normal.  ?Neck is supple.  ?Chest is clear to  auscultation with normal expansion.  ?Cardiovascular exam is irregular ?Abdominal exam nontender or distended. No masses palpated. ?Extremities show no edema. ?neuro grossly intact ? ?Electrocardiogram today shows atrial fibrillation at a rate of 88, nonspecific ST changes, poor R wave progression, personally reviewed. ? ?A/P ? ?1 permanent atrial fibrillation-plan to continue Cardizem for rate control.  She is not particularly symptomatic with her atrial fibrillation and we have therefore elected to continue rate control and anticoagulation and not pursue cardioversion.  Note we previously discussed anticoagulation in the setting of her large abdominal aortic aneurysm with increased risk of rupture/bleed.  She would like to continue to decrease the risk of CVA regardless of her aneurysm. ? ?2 abdominal aortic aneurysm-she has previously elected not to pursue follow-up imaging as she would not be agreeable to any surgical procedures to correct the aneurysm (additional imaging would not change our management).  I think this is appropriate given her age and we will be conservative. ? ?3 hypertension-patient's blood pressure is controlled.  Continue present medications. ? ?4 patient is a no CODE BLUE ? ?Kirk Ruths, MD ? ? ? ?

## 2022-03-31 ENCOUNTER — Telehealth: Payer: Self-pay | Admitting: Cardiology

## 2022-03-31 ENCOUNTER — Encounter: Payer: Self-pay | Admitting: Podiatry

## 2022-03-31 ENCOUNTER — Ambulatory Visit (INDEPENDENT_AMBULATORY_CARE_PROVIDER_SITE_OTHER): Payer: Medicare Other | Admitting: Podiatry

## 2022-03-31 DIAGNOSIS — E1142 Type 2 diabetes mellitus with diabetic polyneuropathy: Secondary | ICD-10-CM

## 2022-03-31 DIAGNOSIS — B351 Tinea unguium: Secondary | ICD-10-CM | POA: Diagnosis not present

## 2022-03-31 DIAGNOSIS — M79674 Pain in right toe(s): Secondary | ICD-10-CM | POA: Diagnosis not present

## 2022-03-31 DIAGNOSIS — M79675 Pain in left toe(s): Secondary | ICD-10-CM

## 2022-03-31 MED ORDER — APIXABAN 5 MG PO TABS: 5.0000 mg | ORAL_TABLET | Freq: Two times a day (BID) | ORAL | 2 refills | Status: DC

## 2022-03-31 NOTE — Telephone Encounter (Signed)
?*  STAT* If patient is at the pharmacy, call can be transferred to refill team. ? ? ?1. Which medications need to be refilled? (please list name of each medication and dose if known) apixaban (ELIQUIS) 5 MG TABS tablet ? ?2. Which pharmacy/location (including street and city if local pharmacy) is medication to be sent to? [id number] - [place], [place] - [address] [place] [address] [place] [address] ? ?3. Do they need a 30 day or 90 day supply? 30 day  ?

## 2022-03-31 NOTE — Telephone Encounter (Signed)
Prescription refill request for Eliquis received. ?Indication:Afib ?Last office visit:1/23 ?Scr:0.8 ?Age: 88 ?Weight:71.5 kg ? ?Prescription refilled ? ?

## 2022-04-02 DIAGNOSIS — E1122 Type 2 diabetes mellitus with diabetic chronic kidney disease: Secondary | ICD-10-CM | POA: Diagnosis not present

## 2022-04-02 DIAGNOSIS — N182 Chronic kidney disease, stage 2 (mild): Secondary | ICD-10-CM | POA: Diagnosis not present

## 2022-04-02 DIAGNOSIS — Z9181 History of falling: Secondary | ICD-10-CM | POA: Diagnosis not present

## 2022-04-02 DIAGNOSIS — M5136 Other intervertebral disc degeneration, lumbar region: Secondary | ICD-10-CM | POA: Diagnosis not present

## 2022-04-02 DIAGNOSIS — I4891 Unspecified atrial fibrillation: Secondary | ICD-10-CM | POA: Diagnosis not present

## 2022-04-02 DIAGNOSIS — I129 Hypertensive chronic kidney disease with stage 1 through stage 4 chronic kidney disease, or unspecified chronic kidney disease: Secondary | ICD-10-CM | POA: Diagnosis not present

## 2022-04-03 DIAGNOSIS — I4891 Unspecified atrial fibrillation: Secondary | ICD-10-CM | POA: Diagnosis not present

## 2022-04-03 DIAGNOSIS — E1122 Type 2 diabetes mellitus with diabetic chronic kidney disease: Secondary | ICD-10-CM | POA: Diagnosis not present

## 2022-04-03 DIAGNOSIS — M5136 Other intervertebral disc degeneration, lumbar region: Secondary | ICD-10-CM | POA: Diagnosis not present

## 2022-04-03 DIAGNOSIS — Z7901 Long term (current) use of anticoagulants: Secondary | ICD-10-CM | POA: Diagnosis not present

## 2022-04-03 DIAGNOSIS — I129 Hypertensive chronic kidney disease with stage 1 through stage 4 chronic kidney disease, or unspecified chronic kidney disease: Secondary | ICD-10-CM | POA: Diagnosis not present

## 2022-04-03 DIAGNOSIS — N182 Chronic kidney disease, stage 2 (mild): Secondary | ICD-10-CM | POA: Diagnosis not present

## 2022-04-03 DIAGNOSIS — Z9181 History of falling: Secondary | ICD-10-CM | POA: Diagnosis not present

## 2022-04-05 NOTE — Progress Notes (Signed)
?  Subjective:  ?Patient ID: Molly Meyer, female    DOB: [birth year],  MRN: [id number] ? ?CAHTERINE HEINZEL presents to clinic today for at risk foot care with history of diabetic neuropathy and painful elongated mycotic toenails 1-5 bilaterally which are tender when wearing enclosed shoe gear. Pain is relieved with periodic professional debridement. ? ?Patient unaware of last HgA1c. Patient did not check blood glucose today. ? ?New problem(s): None.  ? ?PCP is [name], MD , and last visit was February 16, 2022. ? ?Allergies  ?Allergen Reactions  ? Penicillins Rash  ?  Thinks she may be able to take some forms now.Has patient had a PCN reaction causing immediate rash, facial/tongue/throat swelling, SOB or lightheadedness with hypotension: No ?Has patient had a PCN reaction causing severe rash involving mucus membranes or skin necrosis: No ?Has patient had a PCN reaction that required hospitalization No ?Has patient had a PCN reaction occurring within the last 10 years: No ?If all of the above answers are "NO", then may proceed with Cephalosporin use.  ? ? ?Review of Systems: Negative except as noted in the HPI. ? ?Objective: No changes noted in today's physical examination. ?General: Patient is a pleasant 88 y.o. Caucasian female WD, WN in NAD. AAO x 3.  ? ?Neurovascular Examination: ?Capillary refill time to digits immediate b/l. Palpable DP pulse(s) b/l LE. Palpable PT pulse(s) b/l LE. Pedal hair sparse. No edema noted b/l LE. No cyanosis or clubbing noted b/l LE. ? ?Protective sensation diminished with 10g monofilament b/l. ? ?Dermatological:  ?Pedal integument with normal turgor, texture and tone BLE. No open wounds b/l LE. No interdigital macerations noted b/l LE. Toenails 1-5 b/l elongated, discolored, dystrophic, thickened, crumbly with subungual debris and tenderness to dorsal palpation. No hyperkeratotic nor porokeratotic lesions present on today's visit. ? ?Musculoskeletal:  ?Muscle strength 5/5 to  all lower extremity muscle groups bilaterally. HAV with bunion deformity noted b/l LE. Hammertoe deformity noted 1-5 b/l. Pes planus deformity noted bilateral LE. Patient utilizes walker for gait assistance. ? ? ?  Latest Ref Rng & Units 01/28/2022  ?  3:43 PM 10/22/2021  ?  5:00 PM 06/03/2021  ?  3:40 PM  ?Hemoglobin A1C  ?Hemoglobin-A1c 4.8 - 5.6 % 6.4   5.9   6.1    ? ?Assessment/Plan: ?1. Pain due to onychomycosis of toenails of both feet   ?2. Diabetic peripheral neuropathy associated with type 2 diabetes mellitus ([place])   ?  ?-No new findings. No new orders. ?-Toenails 1-5 b/l were debrided in length and girth with sterile nail nippers and dremel without iatrogenic bleeding.  ?-Patient/POA to call should there be question/concern in the interim.  ? ?Return in about 3 months (around 06/30/2022). ? ?Marzetta Board, DPM  ?

## 2022-04-06 DIAGNOSIS — M5136 Other intervertebral disc degeneration, lumbar region: Secondary | ICD-10-CM | POA: Diagnosis not present

## 2022-04-06 DIAGNOSIS — N182 Chronic kidney disease, stage 2 (mild): Secondary | ICD-10-CM | POA: Diagnosis not present

## 2022-04-06 DIAGNOSIS — E1122 Type 2 diabetes mellitus with diabetic chronic kidney disease: Secondary | ICD-10-CM | POA: Diagnosis not present

## 2022-04-06 DIAGNOSIS — I4891 Unspecified atrial fibrillation: Secondary | ICD-10-CM | POA: Diagnosis not present

## 2022-04-06 DIAGNOSIS — Z9181 History of falling: Secondary | ICD-10-CM | POA: Diagnosis not present

## 2022-04-06 DIAGNOSIS — I129 Hypertensive chronic kidney disease with stage 1 through stage 4 chronic kidney disease, or unspecified chronic kidney disease: Secondary | ICD-10-CM | POA: Diagnosis not present

## 2022-04-12 ENCOUNTER — Encounter: Payer: Self-pay | Admitting: Cardiology

## 2022-04-12 ENCOUNTER — Ambulatory Visit (INDEPENDENT_AMBULATORY_CARE_PROVIDER_SITE_OTHER): Payer: Medicare Other | Admitting: Cardiology

## 2022-04-12 VITALS — BP 108/58 | HR 88 | Ht 67.0 in | Wt 155.0 lb

## 2022-04-12 DIAGNOSIS — I4821 Permanent atrial fibrillation: Secondary | ICD-10-CM | POA: Diagnosis not present

## 2022-04-12 DIAGNOSIS — I714 Abdominal aortic aneurysm, without rupture, unspecified: Secondary | ICD-10-CM

## 2022-04-12 DIAGNOSIS — I10 Essential (primary) hypertension: Secondary | ICD-10-CM

## 2022-04-12 NOTE — Patient Instructions (Signed)

## 2022-04-13 DIAGNOSIS — E1122 Type 2 diabetes mellitus with diabetic chronic kidney disease: Secondary | ICD-10-CM | POA: Diagnosis not present

## 2022-04-13 DIAGNOSIS — M5136 Other intervertebral disc degeneration, lumbar region: Secondary | ICD-10-CM | POA: Diagnosis not present

## 2022-04-13 DIAGNOSIS — N182 Chronic kidney disease, stage 2 (mild): Secondary | ICD-10-CM | POA: Diagnosis not present

## 2022-04-13 DIAGNOSIS — I129 Hypertensive chronic kidney disease with stage 1 through stage 4 chronic kidney disease, or unspecified chronic kidney disease: Secondary | ICD-10-CM | POA: Diagnosis not present

## 2022-04-13 DIAGNOSIS — I4891 Unspecified atrial fibrillation: Secondary | ICD-10-CM | POA: Diagnosis not present

## 2022-04-13 DIAGNOSIS — Z9181 History of falling: Secondary | ICD-10-CM | POA: Diagnosis not present

## 2022-04-20 DIAGNOSIS — Z9181 History of falling: Secondary | ICD-10-CM | POA: Diagnosis not present

## 2022-04-20 DIAGNOSIS — I4891 Unspecified atrial fibrillation: Secondary | ICD-10-CM | POA: Diagnosis not present

## 2022-04-20 DIAGNOSIS — E1122 Type 2 diabetes mellitus with diabetic chronic kidney disease: Secondary | ICD-10-CM | POA: Diagnosis not present

## 2022-04-20 DIAGNOSIS — I129 Hypertensive chronic kidney disease with stage 1 through stage 4 chronic kidney disease, or unspecified chronic kidney disease: Secondary | ICD-10-CM | POA: Diagnosis not present

## 2022-04-20 DIAGNOSIS — N182 Chronic kidney disease, stage 2 (mild): Secondary | ICD-10-CM | POA: Diagnosis not present

## 2022-04-20 DIAGNOSIS — M5136 Other intervertebral disc degeneration, lumbar region: Secondary | ICD-10-CM | POA: Diagnosis not present

## 2022-04-28 DIAGNOSIS — M5136 Other intervertebral disc degeneration, lumbar region: Secondary | ICD-10-CM | POA: Diagnosis not present

## 2022-04-28 DIAGNOSIS — I4891 Unspecified atrial fibrillation: Secondary | ICD-10-CM | POA: Diagnosis not present

## 2022-04-28 DIAGNOSIS — E1122 Type 2 diabetes mellitus with diabetic chronic kidney disease: Secondary | ICD-10-CM | POA: Diagnosis not present

## 2022-04-28 DIAGNOSIS — I129 Hypertensive chronic kidney disease with stage 1 through stage 4 chronic kidney disease, or unspecified chronic kidney disease: Secondary | ICD-10-CM | POA: Diagnosis not present

## 2022-04-28 DIAGNOSIS — N182 Chronic kidney disease, stage 2 (mild): Secondary | ICD-10-CM | POA: Diagnosis not present

## 2022-04-28 DIAGNOSIS — Z9181 History of falling: Secondary | ICD-10-CM | POA: Diagnosis not present

## 2022-05-10 ENCOUNTER — Ambulatory Visit: Payer: Medicare Other | Admitting: Internal Medicine

## 2022-06-04 ENCOUNTER — Other Ambulatory Visit: Payer: Self-pay | Admitting: Cardiology

## 2022-06-04 DIAGNOSIS — I4891 Unspecified atrial fibrillation: Secondary | ICD-10-CM

## 2022-06-04 NOTE — Telephone Encounter (Signed)
Prescription refill request for Eliquis received. Indication: Afib  Last office visit: 04/12/22 Stanford Breed) Scr: 0.84 (12/31/21) Age: 88 Weight:70.3kg  Appropriate dose and refill sent to requested pharmacy.

## 2022-06-10 ENCOUNTER — Encounter: Payer: Self-pay | Admitting: Internal Medicine

## 2022-06-10 ENCOUNTER — Ambulatory Visit (INDEPENDENT_AMBULATORY_CARE_PROVIDER_SITE_OTHER): Payer: Medicare Other | Admitting: Internal Medicine

## 2022-06-10 VITALS — BP 96/68 | HR 94 | Temp 97.7°F | Ht 64.4 in | Wt 153.6 lb

## 2022-06-10 DIAGNOSIS — Z23 Encounter for immunization: Secondary | ICD-10-CM | POA: Diagnosis not present

## 2022-06-10 DIAGNOSIS — E1122 Type 2 diabetes mellitus with diabetic chronic kidney disease: Secondary | ICD-10-CM

## 2022-06-10 DIAGNOSIS — I4821 Permanent atrial fibrillation: Secondary | ICD-10-CM | POA: Diagnosis not present

## 2022-06-10 DIAGNOSIS — Z6826 Body mass index (BMI) 26.0-26.9, adult: Secondary | ICD-10-CM

## 2022-06-10 DIAGNOSIS — N182 Chronic kidney disease, stage 2 (mild): Secondary | ICD-10-CM | POA: Diagnosis not present

## 2022-06-10 DIAGNOSIS — I1310 Hypertensive heart and chronic kidney disease without heart failure, with stage 1 through stage 4 chronic kidney disease, or unspecified chronic kidney disease: Secondary | ICD-10-CM | POA: Diagnosis not present

## 2022-06-10 DIAGNOSIS — E538 Deficiency of other specified B group vitamins: Secondary | ICD-10-CM | POA: Diagnosis not present

## 2022-06-10 MED ORDER — CYANOCOBALAMIN 1000 MCG/ML IJ SOLN
1000.0000 ug | Freq: Once | INTRAMUSCULAR | Status: AC
  Administered 2022-06-10: 1000 ug via INTRAMUSCULAR

## 2022-06-10 NOTE — Progress Notes (Signed)
Rich Brave [place],acting as a Education administrator for Maximino Greenland, MD.,have documented all relevant documentation on the behalf of Maximino Greenland, MD,as directed by  Maximino Greenland, MD while in the presence of Maximino Greenland, MD.  This visit occurred during the SARS-CoV-2 public health emergency.  Safety protocols were in place, including screening questions prior to the visit, additional usage of staff PPE, and extensive cleaning of exam room while observing appropriate contact time as indicated for disinfecting solutions.  Subjective:     Patient ID: Molly Meyer , female    DOB: 1924-09-25 , 88 y.o.   MRN: [id number]   Chief Complaint  Patient presents with   Diabetes   B12 Injection    HPI  She presents today for DM/HTN check. She reports compliance with meds. She is alone today. Denies headaches, chest pain and shortness of breath. She offers no concerns/complaints at this time.   Diabetes She presents for her follow-up diabetic visit. She has type 2 diabetes mellitus. Her disease course has been stable. There are no hypoglycemic associated symptoms. Pertinent negatives for hypoglycemia include no dizziness. Pertinent negatives for diabetes include no blurred vision and no chest pain. There are no hypoglycemic complications. Diabetic complications include nephropathy. Risk factors for coronary artery disease include diabetes mellitus, dyslipidemia, hypertension, post-menopausal and sedentary lifestyle. Her weight is stable. Her breakfast blood glucose is taken between 8-9 am. Her breakfast blood glucose range is generally 90-110 mg/dl. An ACE inhibitor/angiotensin II receptor blocker is being taken. She sees a podiatrist.Eye exam is current.  Hypertension This is a chronic problem. The current episode started more than 1 year ago. The problem has been gradually improving since onset. The problem is controlled. Pertinent negatives include no blurred vision, chest pain, palpitations or  shortness of breath.     Past Medical History:  Diagnosis Date   Degenerative disc disease, lumbar    Diabetes mellitus without complication (HCC)    Hypertension      Family History  Problem Relation Age of Onset   Hyperlipidemia Father      Current Outpatient Medications:    acetaminophen (TYLENOL) 500 MG tablet, Take 500 mg by mouth every 6 (six) hours as needed for moderate pain., Disp: , Rfl:    apixaban (ELIQUIS) 5 MG TABS tablet, TAKE 1 TABLET(5 MG) BY MOUTH TWICE DAILY, Disp: 60 tablet, Rfl: 5   Cholecalciferol (VITAMIN D) 50 MCG (2000 UT) tablet, Take 2,000 Units by mouth daily., Disp: , Rfl:    cyanocobalamin (,VITAMIN B-12,) 1000 MCG/ML injection, Inject 1,000 mcg into the muscle See admin instructions. Every 6 or 8 week, Disp: , Rfl:    diltiazem (CARDIZEM CD) 180 MG 24 hr capsule, Take 1 capsule (180 mg total) by mouth daily., Disp: 90 capsule, Rfl: 2   linaclotide (LINZESS) 72 MCG capsule, Take 1 capsule (72 mcg total) by mouth daily before breakfast., Disp: 30 capsule, Rfl: 5   lisinopril-hydrochlorothiazide (ZESTORETIC) 10-12.5 MG tablet, Take 1 tablet by mouth daily., Disp: 90 tablet, Rfl: 2   Propylene Glycol (SYSTANE COMPLETE OP), Place 1 drop into both eyes daily as needed (dry eyes)., Disp: , Rfl:    rosuvastatin (CRESTOR) 20 MG tablet, Take 1 tablet (20 mg total) by mouth daily., Disp: 90 tablet, Rfl: 3   simvastatin (ZOCOR) 40 MG tablet, Take 40 mg by mouth at bedtime., Disp: , Rfl:    Allergies  Allergen Reactions   Penicillins Rash    Thinks she may be able to  take some forms now.Has patient had a PCN reaction causing immediate rash, facial/tongue/throat swelling, SOB or lightheadedness with hypotension: No Has patient had a PCN reaction causing severe rash involving mucus membranes or skin necrosis: No Has patient had a PCN reaction that required hospitalization No Has patient had a PCN reaction occurring within the last 10 years: No If all of the above  answers are "NO", then may proceed with Cephalosporin use.     Review of Systems  Constitutional: Negative.   Eyes:  Negative for blurred vision.  Respiratory: Negative.  Negative for shortness of breath.   Cardiovascular: Negative.  Negative for chest pain and palpitations.  Gastrointestinal:  Positive for constipation.  Neurological: Negative.  Negative for dizziness.  Psychiatric/Behavioral: Negative.       Today's Vitals   06/10/22 1403  BP: 96/68  Pulse: 94  Temp: 97.7 F (36.5 C)  Weight: 153 lb 9.6 oz (69.7 kg)  Height: 5' 4.4" (1.636 m)  PainSc: 0-No pain   Body mass index is 26.04 kg/m.  Wt Readings from Last 3 Encounters:  06/10/22 153 lb 9.6 oz (69.7 kg)  04/12/22 155 lb (70.3 kg)  02/15/22 157 lb 9.6 oz (71.5 kg)    BP Readings from Last 3 Encounters:  06/10/22 96/68  04/12/22 (!) 108/58  02/15/22 111/76     Objective:  Physical Exam Vitals and nursing note reviewed.  Constitutional:      Appearance: Normal appearance.  HENT:     Head: Normocephalic and atraumatic.  Eyes:     Extraocular Movements: Extraocular movements intact.  Cardiovascular:     Rate and Rhythm: Normal rate. Rhythm irregular.     Heart sounds: Normal heart sounds.  Pulmonary:     Effort: Pulmonary effort is normal.     Breath sounds: Normal breath sounds.  Musculoskeletal:     Cervical back: Normal range of motion.     Comments: Ambulatory w/ walker  Skin:    General: Skin is warm.  Neurological:     General: No focal deficit present.     Mental Status: She is alert.  Psychiatric:        Mood and Affect: Mood normal.        Behavior: Behavior normal.      Assessment And Plan:     1. Hypertensive heart and renal disease with renal failure, stage 1 through stage 4 or unspecified chronic kidney disease, without heart failure Comments: Chronic, SBP<100. Repeat BP 106/70. She denies having any dizziness/lightheadedness today. No change in meds. She is encouraged to stay  well hydrated.   2. Permanent atrial fibrillation (HCC) Comments: Chronic, currently asymptomatic. Cardiology input appreciated. Not anticoagulated due to underlying aneurysm.   3. Type 2 diabetes mellitus with stage 2 chronic kidney disease, without long-term current use of insulin (HCC) Comments: Chronic, I will check labs as below.  - CMP14+EGFR - Hemoglobin A1c  4. Vitamin B12 deficiency Comments: She was given vitamin B12 IM x 1.  - Vitamin B12 - cyanocobalamin ((VITAMIN B-12)) injection 1,000 mcg  5. Immunization due - Pneumococcal conjugate vaccine 20-valent (Prevnar 20)   Patient was given opportunity to ask questions. Patient verbalized understanding of the plan and was able to repeat key elements of the plan. All questions were answered to their satisfaction.   I, Maximino Greenland, MD, have reviewed all documentation for this visit. The documentation on 06/30/22 for the exam, diagnosis, procedures, and orders are all accurate and complete.   IF YOU HAVE  BEEN REFERRED TO A SPECIALIST, IT MAY TAKE 1-2 WEEKS TO SCHEDULE/PROCESS THE REFERRAL. IF YOU HAVE NOT HEARD FROM US/SPECIALIST IN TWO WEEKS, PLEASE GIVE [place] A CALL AT [phone].   THE PATIENT IS ENCOURAGED TO PRACTICE SOCIAL DISTANCING DUE TO THE COVID-19 PANDEMIC.

## 2022-06-10 NOTE — Patient Instructions (Signed)
Hypertension, Adult ?Hypertension is another name for high blood pressure. High blood pressure forces your heart to work harder to pump blood. This can cause problems over time. ?There are two numbers in a blood pressure reading. There is a top number (systolic) over a bottom number (diastolic). It is best to have a blood pressure that is below 120/80. ?What are the causes? ?The cause of this condition is not known. Some other conditions can lead to high blood pressure. ?What increases the risk? ?Some lifestyle factors can make you more likely to develop high blood pressure: ?Smoking. ?Not getting enough exercise or physical activity. ?Being overweight. ?Having too much fat, sugar, calories, or salt (sodium) in your diet. ?Drinking too much alcohol. ?Other risk factors include: ?Having any of these conditions: ?Heart disease. ?Diabetes. ?High cholesterol. ?Kidney disease. ?Obstructive sleep apnea. ?Having a family history of high blood pressure and high cholesterol. ?Age. The risk increases with age. ?Stress. ?What are the signs or symptoms? ?High blood pressure may not cause symptoms. Very high blood pressure (hypertensive crisis) may cause: ?Headache. ?Fast or uneven heartbeats (palpitations). ?Shortness of breath. ?Nosebleed. ?Vomiting or feeling like you may vomit (nauseous). ?Changes in how you see. ?Very bad chest pain. ?Feeling dizzy. ?Seizures. ?How is this treated? ?This condition is treated by making healthy lifestyle changes, such as: ?Eating healthy foods. ?Exercising more. ?Drinking less alcohol. ?Your doctor may prescribe medicine if lifestyle changes do not help enough and if: ?Your top number is above 130. ?Your bottom number is above 80. ?Your personal target blood pressure may vary. ?Follow these instructions at home: ?Eating and drinking ? ?If told, follow the DASH eating plan. To follow this plan: ?Fill one half of your plate at each meal with fruits and vegetables. ?Fill one fourth of your plate  at each meal with whole grains. Whole grains include whole-wheat pasta, brown rice, and whole-grain bread. ?Eat or drink low-fat dairy products, such as skim milk or low-fat yogurt. ?Fill one fourth of your plate at each meal with low-fat (lean) proteins. Low-fat proteins include fish, chicken without skin, eggs, beans, and tofu. ?Avoid fatty meat, cured and processed meat, or chicken with skin. ?Avoid pre-made or processed food. ?Limit the amount of salt in your diet to less than 1,500 mg each day. ?Do not drink alcohol if: ?Your doctor tells you not to drink. ?You are pregnant, may be pregnant, or are planning to become pregnant. ?If you drink alcohol: ?Limit how much you have to: ?0-1 drink a day for women. ?0-2 drinks a day for men. ?Know how much alcohol is in your drink. In the [place] one drink equals one 12 oz bottle of beer (355 mL), one 5 oz glass of wine (148 mL), or one 1? oz glass of hard liquor (44 mL). ?Lifestyle ? ?Work with your doctor to stay at a healthy weight or to lose weight. Ask your doctor what the best weight is for you. ?Get at least 30 minutes of exercise that causes your heart to beat faster (aerobic exercise) most days of the week. This may include walking, swimming, or biking. ?Get at least 30 minutes of exercise that strengthens your muscles (resistance exercise) at least 3 days a week. This may include lifting weights or doing Pilates. ?Do not smoke or use any products that contain nicotine or tobacco. If you need help quitting, ask your doctor. ?Check your blood pressure at home as told by your doctor. ?Keep all follow-up visits. ?Medicines ?Take over-the-counter and prescription medicines   only as told by your doctor. Follow directions carefully. ?Do not skip doses of blood pressure medicine. The medicine does not work as well if you skip doses. Skipping doses also puts you at risk for problems. ?Ask your doctor about side effects or reactions to medicines that you should watch  for. ?Contact a doctor if: ?You think you are having a reaction to the medicine you are taking. ?You have headaches that keep coming back. ?You feel dizzy. ?You have swelling in your ankles. ?You have trouble with your vision. ?Get help right away if: ?You get a very bad headache. ?You start to feel mixed up (confused). ?You feel weak or numb. ?You feel faint. ?You have very bad pain in your: ?Chest. ?Belly (abdomen). ?You vomit more than once. ?You have trouble breathing. ?These symptoms may be an emergency. Get help right away. Call 911. ?Do not wait to see if the symptoms will go away. ?Do not drive yourself to the hospital. ?Summary ?Hypertension is another name for high blood pressure. ?High blood pressure forces your heart to work harder to pump blood. ?For most people, a normal blood pressure is less than 120/80. ?Making healthy choices can help lower blood pressure. If your blood pressure does not get lower with healthy choices, you may need to take medicine. ?This information is not intended to replace advice given to you by your health care provider. Make sure you discuss any questions you have with your health care provider. ?Document Revised: 10/01/2021 Document Reviewed: 10/01/2021 ?Elsevier Patient Education ? 2023 Elsevier Inc. ? ?

## 2022-06-11 LAB — CMP14+EGFR
ALT: 17 IU/L (ref 0–32)
AST: 18 IU/L (ref 0–40)
Albumin/Globulin Ratio: 2 (ref 1.2–2.2)
Albumin: 4.3 g/dL (ref 3.5–4.6)
Alkaline Phosphatase: 43 IU/L — ABNORMAL LOW (ref 44–121)
BUN/Creatinine Ratio: 22 (ref 12–28)
BUN: 19 mg/dL (ref 10–36)
Bilirubin Total: 0.5 mg/dL (ref 0.0–1.2)
CO2: 22 mmol/L (ref 20–29)
Calcium: 9.9 mg/dL (ref 8.7–10.3)
Chloride: 105 mmol/L (ref 96–106)
Creatinine, Ser: 0.88 mg/dL (ref 0.57–1.00)
Globulin, Total: 2.1 g/dL (ref 1.5–4.5)
Glucose: 100 mg/dL — ABNORMAL HIGH (ref 70–99)
Potassium: 3.9 mmol/L (ref 3.5–5.2)
Sodium: 142 mmol/L (ref 134–144)
Total Protein: 6.4 g/dL (ref 6.0–8.5)
eGFR: 60 mL/min/{1.73_m2} (ref >59)

## 2022-06-11 LAB — VITAMIN B12: Vitamin B-12: 310 pg/mL (ref 232–1245)

## 2022-06-11 LAB — HEMOGLOBIN A1C
Est. average glucose Bld gHb Est-mCnc: 143 mg/dL
Hgb A1c MFr Bld: 6.6 % — ABNORMAL HIGH (ref 4.8–5.6)

## 2022-06-30 DIAGNOSIS — I1310 Hypertensive heart and chronic kidney disease without heart failure, with stage 1 through stage 4 chronic kidney disease, or unspecified chronic kidney disease: Secondary | ICD-10-CM | POA: Insufficient documentation

## 2022-07-07 ENCOUNTER — Ambulatory Visit (INDEPENDENT_AMBULATORY_CARE_PROVIDER_SITE_OTHER): Payer: Medicare Other | Admitting: Podiatry

## 2022-07-07 ENCOUNTER — Encounter: Payer: Self-pay | Admitting: Podiatry

## 2022-07-07 DIAGNOSIS — M79674 Pain in right toe(s): Secondary | ICD-10-CM

## 2022-07-07 DIAGNOSIS — M79675 Pain in left toe(s): Secondary | ICD-10-CM

## 2022-07-07 DIAGNOSIS — E1142 Type 2 diabetes mellitus with diabetic polyneuropathy: Secondary | ICD-10-CM | POA: Diagnosis not present

## 2022-07-07 DIAGNOSIS — B351 Tinea unguium: Secondary | ICD-10-CM

## 2022-07-11 NOTE — Progress Notes (Signed)
  Subjective:  Patient ID: Molly Meyer, female    DOB: July 14, 1924,  MRN: [id number]  Molly Meyer presents to clinic today for at risk foot care with history of diabetic neuropathy and painful thick toenails that are difficult to trim. Pain interferes with ambulation. Aggravating factors include wearing enclosed shoe gear. Pain is relieved with periodic professional debridement.  Last known HgA1c was unknown.  Patient did not check blood glucose today.  New problem(s): None.   PCP is [name], MD , and last visit was  June 10, 2022  Allergies  Allergen Reactions   Penicillins Rash    Thinks she may be able to take some forms now.Has patient had a PCN reaction causing immediate rash, facial/tongue/throat swelling, SOB or lightheadedness with hypotension: No Has patient had a PCN reaction causing severe rash involving mucus membranes or skin necrosis: No Has patient had a PCN reaction that required hospitalization No Has patient had a PCN reaction occurring within the last 10 years: No If all of the above answers are "NO", then may proceed with Cephalosporin use.    Review of Systems: Negative except as noted in the HPI.  Objective: No changes noted in today's physical examination. General: Patient is a pleasant 88 y.o. Caucasian female WD, WN in NAD. AAO x 3.   Neurovascular Examination: Capillary refill time to digits immediate b/l. Palpable DP pulse(s) b/l LE. Palpable PT pulse(s) b/l LE. Pedal hair sparse. No edema noted b/l LE. No cyanosis or clubbing noted b/l LE.  Protective sensation diminished with 10g monofilament b/l.  Dermatological:  Pedal integument with normal turgor, texture and tone BLE. No open wounds b/l LE. No interdigital macerations noted b/l LE. Toenails 1-5 b/l elongated, discolored, dystrophic, thickened, crumbly with subungual debris and tenderness to dorsal palpation. No hyperkeratotic nor porokeratotic lesions present on today's  visit.  Musculoskeletal:  Muscle strength 5/5 to all lower extremity muscle groups bilaterally. HAV with bunion deformity noted b/l LE. Hammertoe deformity noted 1-5 b/l. Pes planus deformity noted bilateral LE. Patient utilizes walker for gait assistance.     Latest Ref Rng & Units 06/10/2022    2:26 PM 01/28/2022    3:43 PM 10/22/2021    5:00 PM  Hemoglobin A1C  Hemoglobin-A1c 4.8 - 5.6 % 6.6  6.4  5.9    Assessment/Plan: 1. Pain due to onychomycosis of toenails of both feet   2. Diabetic peripheral neuropathy associated with type 2 diabetes mellitus ([place])      -Patient was evaluated and treated. All patient's and/or POA's questions/concerns answered on today's visit. -Continue foot and shoe inspections daily. Monitor blood glucose per PCP/Endocrinologist's recommendations. -Patient to continue soft, supportive shoe gear daily. -Mycotic toenails 1-5 bilaterally were debrided in length and girth with sterile nail nippers and dremel without incident. -Patient/POA to call should there be question/concern in the interim.   Return in about 3 months (around 10/07/2022).  Marzetta Board, DPM

## 2022-07-13 ENCOUNTER — Ambulatory Visit: Payer: Medicare Other

## 2022-07-13 VITALS — BP 102/70 | HR 72 | Temp 98.0°F

## 2022-07-13 DIAGNOSIS — I1310 Hypertensive heart and chronic kidney disease without heart failure, with stage 1 through stage 4 chronic kidney disease, or unspecified chronic kidney disease: Secondary | ICD-10-CM

## 2022-07-13 NOTE — Progress Notes (Signed)
Patient presents today for BPC. She is currently taking diltiazem '180mg'$  and lisinopril-hctz 10-12.'5mg'$ .   BP Readings from Last 3 Encounters:  07/13/22 102/70  06/10/22 96/68  04/12/22 (!) 108/58   Pt is to continue with current medication and increase water intake. She will follow up with provider.

## 2022-09-07 ENCOUNTER — Ambulatory Visit (INDEPENDENT_AMBULATORY_CARE_PROVIDER_SITE_OTHER): Payer: Medicare Other

## 2022-09-07 VITALS — BP 110/70 | HR 90 | Temp 98.3°F | Ht 64.4 in | Wt 153.0 lb

## 2022-09-07 DIAGNOSIS — Z23 Encounter for immunization: Secondary | ICD-10-CM

## 2022-09-07 DIAGNOSIS — E538 Deficiency of other specified B group vitamins: Secondary | ICD-10-CM

## 2022-09-07 DIAGNOSIS — I1310 Hypertensive heart and chronic kidney disease without heart failure, with stage 1 through stage 4 chronic kidney disease, or unspecified chronic kidney disease: Secondary | ICD-10-CM

## 2022-09-07 MED ORDER — CYANOCOBALAMIN 1000 MCG/ML IJ SOLN
1000.0000 ug | Freq: Once | INTRAMUSCULAR | Status: AC
  Administered 2022-09-07: 1000 ug via INTRAMUSCULAR

## 2022-09-07 NOTE — Progress Notes (Signed)
Patient presents today for a bp check and vitamin b12 injection. Her bp was 110/70 pt was advised to continue with her current meds and follow up with her provider during her next visit. YL,RMA

## 2022-09-22 ENCOUNTER — Other Ambulatory Visit: Payer: Self-pay | Admitting: Internal Medicine

## 2022-09-22 ENCOUNTER — Other Ambulatory Visit: Payer: Self-pay | Admitting: Cardiology

## 2022-09-22 DIAGNOSIS — I4821 Permanent atrial fibrillation: Secondary | ICD-10-CM

## 2022-10-08 NOTE — Progress Notes (Unsigned)
error 

## 2022-10-10 NOTE — Progress Notes (Unsigned)
Cardiology Clinic Note   Patient Name: Molly Meyer Date of Encounter: 10/11/2022  Primary Care Provider:  [name], MD Primary Cardiologist:  Kirk Ruths, MD  Patient Profile    Molly Meyer is a 88 year-old female who presents to the clinic today for follow-up of A-fib.  Past Medical History    Past Medical History:  Diagnosis Date   Degenerative disc disease, lumbar    Diabetes mellitus without complication ([place])    Hypertension    Past Surgical History:  Procedure Laterality Date   KNEE SURGERY      Allergies  Allergies  Allergen Reactions   Penicillins Rash    Thinks she may be able to take some forms now.Has patient had a PCN reaction causing immediate rash, facial/tongue/throat swelling, SOB or lightheadedness with hypotension: No Has patient had a PCN reaction causing severe rash involving mucus membranes or skin necrosis: No Has patient had a PCN reaction that required hospitalization No Has patient had a PCN reaction occurring within the last 10 years: No If all of the above answers are "NO", then may proceed with Cephalosporin use.    History of Present Illness    Molly Meyer has a past medical history of permanent A-fib, hypertension, AAA, T2DM.  She was last seen by Dr. Stanford Breed on 04/12/2022.  At that time she was continued on Cardizem for rate control and Eliquis for anticoagulation.  Based on her age she decided to not pursue follow-up imaging for her AAA and despite its size opted to continue anticoagulation.  Her blood pressure was stable and medications continued.  Echo on 01/02/2022 showed a large AAA measuring 6.3 cm.  EF 55 to 60%.  Normal left ventricular function with no regional wall motion abnormalities.  Lead dilated left and right atria.  Old mitral valve regurgitation.  Mild aortic valve regurgitation.  Aortic valve sclerosis without stenosis.  Today she reports she is doing well.  She denies cardiac awareness of her A-fib, chest  pain, or lower extremity edema.  She has no shortness of breath at rest or with exertion.  She lives independently and manages most of her ADLs.  She does not cook any longer but instead has premade meals that she heats for herself.  Her daughter and granddaughter live close by and will sometimes bring her meals.  She does not exercise.  She utilizes a rollator for ambulation.  She is taking her medications as prescribed.  She denies blood in her stool, hematuria, or hemoptysis.  She has not noticed any increased bruising.  She has no questions or concerns today.   Home Medications    Eliquis 5 mg daily Diltiazem 180 mg daily Lisinopril-hydrochlorothiazide 10/12.5 mg daily Rosuvastatin 20 mg nightly  Family History    Family History  Problem Relation Age of Onset   Hyperlipidemia Father    She indicated that her mother is deceased. She indicated that her father is deceased.   Social History    Social History   Socioeconomic History   Marital status: Widowed    Spouse name: Not on file   Number of children: Not on file   Years of education: Not on file   Highest education level: Not on file  Occupational History   Occupation: retired  Tobacco Use   Smoking status: Never   Smokeless tobacco: Never  Vaping Use   Vaping Use: Never used  Substance and Sexual Activity   Alcohol use: No    Alcohol/week: 0.0  standard drinks of alcohol   Drug use: Never   Sexual activity: Not Currently  Other Topics Concern   Not on file  Social History Narrative   Not on file   Social Determinants of Health   Financial Resource Strain: Low Risk  (12/10/2021)   Overall Financial Resource Strain (CARDIA)    Difficulty of Paying Living Expenses: Not hard at all  Food Insecurity: No Food Insecurity (12/10/2021)   Hunger Vital Sign    Worried About Running Out of Food in the Last Year: Never true    Ran Out of Food in the Last Year: Never true  Transportation Needs: No Transportation Needs  (12/10/2021)   PRAPARE - Hydrologist (Medical): No    Lack of Transportation (Non-Medical): No  Physical Activity: Inactive (12/10/2021)   Exercise Vital Sign    Days of Exercise per Week: 0 days    Minutes of Exercise per Session: 0 min  Stress: No Stress Concern Present (12/10/2021)   [place]    Feeling of Stress : Not at all  Social Connections: Not on file  Intimate Partner Violence: Not At Risk (09/28/2018)   Humiliation, Afraid, Rape, and Kick questionnaire    Fear of Current or Ex-Partner: No    Emotionally Abused: No    Physically Abused: No    Sexually Abused: No     Review of Systems    General:  No chills, fever, night sweats or weight changes.  Cardiovascular:  No chest pain, dyspnea on exertion, edema, orthopnea, palpitations, paroxysmal nocturnal dyspnea. Dermatological: No rash, lesions/masses Respiratory: No cough, dyspnea Urologic: No hematuria, dysuria Abdominal:   No nausea, vomiting, diarrhea, bright red blood per rectum, melena, or hematemesis Neurologic:  No visual changes, weakness, changes in mental status. All other systems reviewed and are otherwise negative except as noted above.  Physical Exam    VS:  BP 112/72 (BP Location: Left Arm, Patient Position: Sitting, Cuff Size: Normal)   Pulse 86   Ht '5\' 8"'$  (1.727 m)   Wt 154 lb 3.2 oz (69.9 kg)   SpO2 97%   BMI 23.45 kg/m  , BMI Body mass index is 23.45 kg/m. GEN: Well nourished, well developed, in no acute distress. HEENT: normal. Neck: Supple, no JVD, carotid bruits, or masses. Cardiac: Irregular rhythm, no murmurs, rubs, or gallops. No clubbing, cyanosis, edema.  Radials/DP/PT 2+ and equal bilaterally.  Respiratory:  Respirations regular and unlabored, clear to auscultation bilaterally. GI: Soft, nontender, nondistended. MS: no deformity or atrophy. Skin: warm and dry, no rash. Neuro:  Strength  and sensation are intact. Psych: Normal affect.  Accessory Clinical Findings    The following studies were reviewed for this visit: Echo 01/02/2022: IMPRESSIONS    1. Large 6.3 cm AAA.   2. Left ventricular ejection fraction, by estimation, is 55 to 60%. The  left ventricle has normal function. The left ventricle has no regional  wall motion abnormalities. Left ventricular diastolic function could not  be evaluated.   3. Right ventricular systolic function is normal. The right ventricular  size is normal.   4. Left atrial size was mildly dilated.   5. Right atrial size was mildly dilated.   6. The mitral valve is normal in structure. Mild mitral valve  regurgitation. No evidence of mitral stenosis.   7. The aortic valve has an indeterminant number of cusps. Aortic valve  regurgitation is mild. Aortic valve sclerosis is  present, with no evidence  of aortic valve stenosis.   8. Aortic dilatation noted. Aneurysm of the abdominal aorta, measuring 63  mm. There is borderline dilatation of the ascending aorta, measuring 39  mm.   9. The inferior vena cava is normal in size with greater than 50%  respiratory variability, suggesting right atrial pressure of 3 mmHg.   Recent Labs: 12/31/2021: Magnesium 2.0 01/02/2022: Hemoglobin 14.0; Platelets 157; TSH 2.443 06/10/2022: ALT 17; BUN 19; Creatinine, Ser 0.88; Potassium 3.9; Sodium 142   Recent Lipid Panel    Component Value Date/Time   CHOL 133 01/28/2022 1543   TRIG 84 01/28/2022 1543   HDL 56 01/28/2022 1543   CHOLHDL 2.4 01/28/2022 1543   LDLCALC 61 01/28/2022 1543      ECG personally reviewed by me today A-fib/flutter heart rate 86. Unchanged from 04/12/2022.  CHA2DS2-VASc Score = 5 [CHF History: 0, HTN History: 1, Diabetes History: 1, Stroke History: 0, Vascular Disease History: 0, Age Score: 2, Gender Score: 1].  Therefore, the patient's annual risk of stroke is 7.2 %.      Assessment & Plan   Permanent Afib.  EKG shows  A-fib/flutter today.  Rate controlled at 86.  Unchanged from previous EKG in April 2023.  She denies cardiac awareness of A-fib.  She denies shortness of breath at rest or with exertion. HTN.  Stable. BP 112/72. AAA.  She continues to elect to not pursue follow-up imaging for her AAA.  She previously discussed this with Dr. Stanford Breed in April 2023.  We will continue conservative management. T2DM.  A1c 06/10/2022 was 6.6.  She is diet controlled.   Disposition: Continue current medications.  Follow-up in 6 months or sooner as needed.   Justice [place]. Jazelyn Sipe, NP-C     10/11/2022, 2:30 PM [place] [address] [address] Office [phone] Fax [phone]  Notice: This dictation was prepared with Dragon dictation along with smaller phrase technology. Any transcriptional errors that result from this process are unintentional and may not be corrected upon review.  I spent 13 minutes examining this patient, reviewing medications, and using patient centered shared decision making involving her cardiac care.  Prior to her visit I spent greater than 20 minutes reviewing her past medical history,  medications, and prior cardiac tests.

## 2022-10-11 ENCOUNTER — Ambulatory Visit: Payer: Medicare Other | Attending: General Practice | Admitting: Student

## 2022-10-11 ENCOUNTER — Encounter: Payer: Self-pay | Admitting: General Practice

## 2022-10-11 VITALS — BP 112/72 | HR 86 | Ht 68.0 in | Wt 154.2 lb

## 2022-10-11 DIAGNOSIS — I10 Essential (primary) hypertension: Secondary | ICD-10-CM | POA: Diagnosis not present

## 2022-10-11 DIAGNOSIS — I714 Abdominal aortic aneurysm, without rupture, unspecified: Secondary | ICD-10-CM | POA: Diagnosis not present

## 2022-10-11 DIAGNOSIS — I4821 Permanent atrial fibrillation: Secondary | ICD-10-CM | POA: Diagnosis not present

## 2022-10-11 DIAGNOSIS — E119 Type 2 diabetes mellitus without complications: Secondary | ICD-10-CM | POA: Diagnosis not present

## 2022-10-11 NOTE — Patient Instructions (Signed)
Medication Instructions:  The current medical regimen is effective;  continue present plan and medications as directed. Please refer to the Current Medication list given to you today.  *If you need a refill on your cardiac medications before your next appointment, please call your pharmacy*   Lab Work: NONE  Testing/Procedures: NONE  Follow-Up: At [facility], you and your health needs are our priority.  As part of our continuing mission to provide you with exceptional heart care, we have created designated Provider Care Teams.  These Care Teams include your primary Cardiologist (physician) and Advanced Practice Providers (APPs -  Physician Assistants and Nurse Practitioners) who all work together to provide you with the care you need, when you need it.  Your next appointment:   6 month(s)  The format for your next appointment:   In Person  Provider:   Kirk Ruths, MD     Important Information About Sugar

## 2022-10-13 ENCOUNTER — Encounter: Payer: Self-pay | Admitting: Podiatry

## 2022-10-13 ENCOUNTER — Ambulatory Visit (INDEPENDENT_AMBULATORY_CARE_PROVIDER_SITE_OTHER): Payer: Medicare Other | Admitting: Podiatry

## 2022-10-13 DIAGNOSIS — M2041 Other hammer toe(s) (acquired), right foot: Secondary | ICD-10-CM | POA: Diagnosis not present

## 2022-10-13 DIAGNOSIS — M2142 Flat foot [pes planus] (acquired), left foot: Secondary | ICD-10-CM

## 2022-10-13 DIAGNOSIS — M2010 Hallux valgus (acquired), unspecified foot: Secondary | ICD-10-CM

## 2022-10-13 DIAGNOSIS — M79675 Pain in left toe(s): Secondary | ICD-10-CM

## 2022-10-13 DIAGNOSIS — M2042 Other hammer toe(s) (acquired), left foot: Secondary | ICD-10-CM | POA: Diagnosis not present

## 2022-10-13 DIAGNOSIS — E1142 Type 2 diabetes mellitus with diabetic polyneuropathy: Secondary | ICD-10-CM | POA: Diagnosis not present

## 2022-10-13 DIAGNOSIS — M2141 Flat foot [pes planus] (acquired), right foot: Secondary | ICD-10-CM | POA: Diagnosis not present

## 2022-10-13 DIAGNOSIS — Z800 Family history of malignant neoplasm of digestive organs: Secondary | ICD-10-CM | POA: Insufficient documentation

## 2022-10-13 NOTE — Progress Notes (Signed)
  Subjective:  Patient ID: Molly Meyer, female    DOB: 01-11-1924,  MRN: [id number]  Molly Meyer presents to clinic today for:  Chief Complaint  Patient presents with   Nail Problem    Diabetic foot care BS-do not check A1C-Do not know PCP-Robyn Sanders PCP [facility]- Couple weeks ago   New problem(s): None.   She would like to get a new pair of diabetic shoes.  PCP is [name], MD , and last visit was  June 10, 2022.  Allergies  Allergen Reactions   Penicillins Rash    Thinks she may be able to take some forms now.Has patient had a PCN reaction causing immediate rash, facial/tongue/throat swelling, SOB or lightheadedness with hypotension: No Has patient had a PCN reaction causing severe rash involving mucus membranes or skin necrosis: No Has patient had a PCN reaction that required hospitalization No Has patient had a PCN reaction occurring within the last 10 years: No If all of the above answers are "NO", then may proceed with Cephalosporin use.    Review of Systems: Negative except as noted in the HPI.  Objective: No changes noted in today's physical examination.  CHASTIDY RANKER is a pleasant 88 y.o. female WD, WN in NAD. AAO x 3.  Neurovascular Examination: Capillary refill time to digits immediate b/l. Palpable DP pulse(s) b/l LE. Palpable PT pulse(s) b/l LE. Pedal hair sparse. No edema noted b/l LE. No cyanosis or clubbing noted b/l LE.  Protective sensation diminished with 10g monofilament b/l.  Dermatological:  Pedal integument thin, shiny and atrophic b/l. No open wounds b/l LE. No interdigital macerations noted b/l LE. Toenails 1-5 b/l elongated, discolored, dystrophic, thickened, crumbly with subungual debris and tenderness to dorsal palpation. No hyperkeratotic nor porokeratotic lesions present on today's visit.  Venous stasis skin changes noted b/l LE.  Musculoskeletal:  Muscle strength 5/5 to all lower extremity muscle groups bilaterally. HAV with  bunion deformity noted b/l LE. Hammertoe deformity noted 1-5 b/l. Pes planus deformity noted bilateral LE. Patient utilizes rollator for gait assistance.  Assessment/Plan: 1. Pain due to onychomycosis of toenails of both feet   2. Diabetic peripheral neuropathy associated with type 2 diabetes mellitus ([place])   3. Acquired hammertoes of both feet   4. Acquired hallux valgus, unspecified laterality   5. Pes planus of both feet     No orders of the defined types were placed in this encounter.   -Consent given for treatment as described below: -Examined patient. -Continue foot and shoe inspections daily. Monitor blood glucose per PCP/Endocrinologist's recommendations. -Continue diabetic shoes daily. -Discussed diabetic shoe benefit available based on patient's diagnoses. Patient/POA would like to proceed. Order entered for one pair extra depth shoes and 3 pair total contact insoles. Patient qualifies based on diagnoses. She will be measured in January, 2024. -Toenails 1-5 b/l were debrided in length and girth with sterile nail nippers and dremel without iatrogenic bleeding.  -Patient/POA to call should there be question/concern in the interim.   Return in about 3 months (around 01/13/2023).  Marzetta Board, DPM

## 2022-12-01 ENCOUNTER — Other Ambulatory Visit: Payer: Self-pay | Admitting: Cardiology

## 2022-12-01 DIAGNOSIS — I4891 Unspecified atrial fibrillation: Secondary | ICD-10-CM

## 2022-12-01 NOTE — Telephone Encounter (Signed)
Prescription refill request for Eliquis received. Indication: Afib  Last office visit: 10/11/22 ([place])  Scr: 0.88 (06/10/22)  Age: 88 Weight: 69.9kg  Appropriate dose and refill sent to requested pharmacy.

## 2022-12-15 ENCOUNTER — Encounter: Payer: Self-pay | Admitting: Internal Medicine

## 2022-12-15 ENCOUNTER — Ambulatory Visit (INDEPENDENT_AMBULATORY_CARE_PROVIDER_SITE_OTHER): Payer: Medicare Other | Admitting: Internal Medicine

## 2022-12-15 VITALS — BP 110/72 | HR 77 | Temp 97.8°F | Ht 68.0 in | Wt 154.0 lb

## 2022-12-15 DIAGNOSIS — I1310 Hypertensive heart and chronic kidney disease without heart failure, with stage 1 through stage 4 chronic kidney disease, or unspecified chronic kidney disease: Secondary | ICD-10-CM

## 2022-12-15 DIAGNOSIS — Z23 Encounter for immunization: Secondary | ICD-10-CM

## 2022-12-15 DIAGNOSIS — N182 Chronic kidney disease, stage 2 (mild): Secondary | ICD-10-CM | POA: Diagnosis not present

## 2022-12-15 DIAGNOSIS — Z6823 Body mass index (BMI) 23.0-23.9, adult: Secondary | ICD-10-CM

## 2022-12-15 DIAGNOSIS — E1122 Type 2 diabetes mellitus with diabetic chronic kidney disease: Secondary | ICD-10-CM | POA: Diagnosis not present

## 2022-12-15 DIAGNOSIS — H6122 Impacted cerumen, left ear: Secondary | ICD-10-CM | POA: Diagnosis not present

## 2022-12-15 NOTE — Progress Notes (Signed)
Rich Brave [place]leton,acting as a Education administrator for Maximino Greenland, MD.,have documented all relevant documentation on the behalf of Maximino Greenland, MD,as directed by  Maximino Greenland, MD while in the presence of Maximino Greenland, MD.    Subjective:     Patient ID: Molly Meyer , female    DOB: 05-05-1924 , 88 y.o.   MRN: [id number]   Chief Complaint  Patient presents with   Diabetes   Hypertension    HPI  She presents today for DM/HTN check. She reports compliance with meds. She is alone today. Denies headaches, chest pain and shortness of breath. She offers no concerns/complaints at this time.   Diabetes She presents for her follow-up diabetic visit. She has type 2 diabetes mellitus. Her disease course has been stable. Pertinent negatives for diabetes include no blurred vision, no polydipsia, no polyphagia and no polyuria. There are no hypoglycemic complications. Diabetic complications include nephropathy. Risk factors for coronary artery disease include diabetes mellitus, dyslipidemia, hypertension, post-menopausal and sedentary lifestyle. Her weight is stable. Her breakfast blood glucose is taken between 8-9 am. Her breakfast blood glucose range is generally 90-110 mg/dl. An ACE inhibitor/angiotensin II receptor blocker is being taken. She sees a podiatrist.Eye exam is current.  Hypertension This is a chronic problem. The current episode started more than 1 year ago. The problem has been gradually improving since onset. The problem is controlled. Pertinent negatives include no blurred vision or shortness of breath.     Past Medical History:  Diagnosis Date   Degenerative disc disease, lumbar    Diabetes mellitus without complication (HCC)    Hypertension      Family History  Problem Relation Age of Onset   Hyperlipidemia Father      Current Outpatient Medications:    acetaminophen (TYLENOL) 500 MG tablet, Take 500 mg by mouth every 6 (six) hours as needed for moderate pain.,  Disp: , Rfl:    Cholecalciferol (VITAMIN D) 50 MCG (2000 UT) tablet, Take 2,000 Units by mouth daily., Disp: , Rfl:    cyanocobalamin (,VITAMIN B-12,) 1000 MCG/ML injection, Inject 1,000 mcg into the muscle See admin instructions. Every 6 or 8 week, Disp: , Rfl:    diltiazem (CARDIZEM CD) 180 MG 24 hr capsule, TAKE 1 CAPSULE BY MOUTH EVERY DAY, Disp: 90 capsule, Rfl: 2   ELIQUIS 5 MG TABS tablet, TAKE 1 TABLET(5 MG) BY MOUTH TWICE DAILY, Disp: 60 tablet, Rfl: 5   linaclotide (LINZESS) 72 MCG capsule, Take 1 capsule (72 mcg total) by mouth daily before breakfast., Disp: 30 capsule, Rfl: 5   lisinopril-hydrochlorothiazide (ZESTORETIC) 10-12.5 MG tablet, TAKE 1 TABLET BY MOUTH EVERY DAY, Disp: 90 tablet, Rfl: 2   Propylene Glycol (SYSTANE COMPLETE OP), Place 1 drop into both eyes daily as needed (dry eyes)., Disp: , Rfl:    simvastatin (ZOCOR) 40 MG tablet, Take 40 mg by mouth at bedtime., Disp: , Rfl:    rosuvastatin (CRESTOR) 20 MG tablet, Take 1 tablet (20 mg total) by mouth daily., Disp: 90 tablet, Rfl: 3   Allergies  Allergen Reactions   Penicillins Rash    Thinks she may be able to take some forms now.Has patient had a PCN reaction causing immediate rash, facial/tongue/throat swelling, SOB or lightheadedness with hypotension: No Has patient had a PCN reaction causing severe rash involving mucus membranes or skin necrosis: No Has patient had a PCN reaction that required hospitalization No Has patient had a PCN reaction occurring within the last 10 years:  No If all of the above answers are "NO", then may proceed with Cephalosporin use.     Review of Systems  Constitutional: Negative.   Eyes: Negative.  Negative for blurred vision.  Respiratory: Negative.  Negative for shortness of breath.   Cardiovascular: Negative.   Endocrine: Negative for polydipsia, polyphagia and polyuria.  Musculoskeletal: Negative.   Skin: Negative.   Psychiatric/Behavioral: Negative.       Today's Vitals    12/15/22 1451  BP: 110/72  Pulse: 77  Temp: 97.8 F (36.6 C)  Weight: 154 lb (69.9 kg)  Height: _0  (1.727 m)  PainSc: 0-No pain   Body mass index is 23.42 kg/m.  Wt Readings from Last 3 Encounters:  12/15/22 154 lb (69.9 kg)  10/11/22 154 lb 3.2 oz (69.9 kg)  09/07/22 153 lb (69.4 kg)     Objective:  Physical Exam Vitals and nursing note reviewed.  Constitutional:      Appearance: Normal appearance.  HENT:     Head: Normocephalic and atraumatic.     Right Ear: Tympanic membrane, ear canal and external ear normal. There is no impacted cerumen.     Left Ear: Ear canal and external ear normal. There is impacted cerumen.     Ears:     Comments: Wears hearing aids    Nose:     Comments: Masked     Mouth/Throat:     Comments: Masked  Eyes:     Extraocular Movements: Extraocular movements intact.  Cardiovascular:     Rate and Rhythm: Normal rate and regular rhythm.     Heart sounds: Normal heart sounds.  Pulmonary:     Effort: Pulmonary effort is normal.     Breath sounds: Normal breath sounds.  Skin:    General: Skin is warm.  Neurological:     General: No focal deficit present.     Mental Status: She is alert.  Psychiatric:        Mood and Affect: Mood normal.        Behavior: Behavior normal.         Assessment And Plan:     1. Type 2 diabetes mellitus with stage 2 chronic kidney disease, without long-term current use of insulin (HCC) Comments: Chronic, I will check labs as below. She will rto in 4 months for re-evaluation. - CMP14+EGFR - Hemoglobin A1c  2. Hypertensive heart and renal disease with renal failure, stage 1 through stage 4 or unspecified chronic kidney disease, without heart failure Comments: Chronic, controlled. She will c/w diltiazem and lisinopril/hctz. Reminded to follow low sodium diet. - CBC no Diff - CMP14+EGFR  3. Impacted cerumen, left ear Comments: After obtaining verbal consent, left ear was flushed by irrigation w/o  complications. No TM abnormalities were noted. - Ear Lavage  4. BMI 23.0-23.9, adult Comments: She is encouraged to perform chair exercises during commercials while watching TV 4-5 days per week.  5. Immunization due Comments: She declines Shingrix today, agrees to consider at her next visit.   Patient was given opportunity to ask questions. Patient verbalized understanding of the plan and was able to repeat key elements of the plan. All questions were answered to their satisfaction.   I, Maximino Greenland, MD, have reviewed all documentation for this visit. The documentation on 12/15/22 for the exam, diagnosis, procedures, and orders are all accurate and complete.   IF YOU HAVE BEEN REFERRED TO A SPECIALIST, IT MAY TAKE 1-2 WEEKS TO SCHEDULE/PROCESS THE REFERRAL. IF YOU HAVE  NOT HEARD FROM US/SPECIALIST IN TWO WEEKS, PLEASE GIVE [place] A CALL AT [phone].   THE PATIENT IS ENCOURAGED TO PRACTICE SOCIAL DISTANCING DUE TO THE COVID-19 PANDEMIC.

## 2022-12-15 NOTE — Patient Instructions (Signed)

## 2022-12-16 LAB — HEMOGLOBIN A1C
Est. average glucose Bld gHb Est-mCnc: 137 mg/dL
Hgb A1c MFr Bld: 6.4 % — ABNORMAL HIGH (ref 4.8–5.6)

## 2022-12-16 LAB — CMP14+EGFR
ALT: 16 IU/L (ref 0–32)
AST: 18 IU/L (ref 0–40)
Albumin/Globulin Ratio: 1.7 (ref 1.2–2.2)
Albumin: 4.1 g/dL (ref 3.6–4.6)
Alkaline Phosphatase: 47 IU/L (ref 44–121)
BUN/Creatinine Ratio: 20 (ref 12–28)
BUN: 19 mg/dL (ref 10–36)
Bilirubin Total: 0.6 mg/dL (ref 0.0–1.2)
CO2: 24 mmol/L (ref 20–29)
Calcium: 9.7 mg/dL (ref 8.7–10.3)
Chloride: 102 mmol/L (ref 96–106)
Creatinine, Ser: 0.96 mg/dL (ref 0.57–1.00)
Globulin, Total: 2.4 g/dL (ref 1.5–4.5)
Glucose: 108 mg/dL — ABNORMAL HIGH (ref 70–99)
Potassium: 4.1 mmol/L (ref 3.5–5.2)
Sodium: 140 mmol/L (ref 134–144)
Total Protein: 6.5 g/dL (ref 6.0–8.5)
eGFR: 54 mL/min/{1.73_m2} — ABNORMAL LOW (ref >59)

## 2022-12-16 LAB — CBC
Hematocrit: 39.8 % (ref 34.0–46.6)
Hemoglobin: 13.6 g/dL (ref 11.1–15.9)
MCH: 30.2 pg (ref 26.6–33.0)
MCHC: 34.2 g/dL (ref 31.5–35.7)
MCV: 88 fL (ref 79–97)
Platelets: 152 10*3/uL (ref 150–450)
RBC: 4.5 x10E6/uL (ref 3.77–5.28)
RDW: 12.8 % (ref 11.7–15.4)
WBC: 4.8 10*3/uL (ref 3.4–10.8)

## 2022-12-23 ENCOUNTER — Ambulatory Visit (INDEPENDENT_AMBULATORY_CARE_PROVIDER_SITE_OTHER): Payer: Medicare Other

## 2022-12-23 VITALS — Ht 68.0 in | Wt 155.0 lb

## 2022-12-23 DIAGNOSIS — Z0000 Encounter for general adult medical examination without abnormal findings: Secondary | ICD-10-CM

## 2022-12-23 NOTE — Progress Notes (Signed)
I connected with Molly Meyer today by telephone and verified that I am speaking with the correct person using two identifiers. Location patient: home Location provider: work Persons participating in the virtual visit: Maryellen Dowdle, Glenna Durand LPN.   I discussed the limitations, risks, security and privacy concerns of performing an evaluation and management service by telephone and the availability of in person appointments. I also discussed with the patient that there may be a patient responsible charge related to this service. The patient expressed understanding and verbally consented to this telephonic visit.    Interactive audio and video telecommunications were attempted between this provider and patient, however failed, due to patient having technical difficulties OR patient did not have access to video capability.  We continued and completed visit with audio only.     Vital signs may be patient reported or missing.  Subjective:   Molly Meyer is a 88 y.o. female who presents for Medicare Annual (Subsequent) preventive examination.  Review of Systems     Cardiac Risk Factors include: advanced age (>78mn, >>71women);diabetes mellitus;dyslipidemia;hypertension     Objective:    Today's Vitals   12/23/22 1136  Weight: 155 lb (70.3 kg)  Height: '5\' 8"'$  (1.727 m)   Body mass index is 23.57 kg/m.     12/23/2022   11:42 AM 12/31/2021    5:55 PM 12/10/2021    2:02 PM 12/03/2020    3:32 PM 11/14/2019    2:11 PM 06/05/2019   11:09 AM 09/28/2018   11:14 AM  Advanced Directives  Does Patient Have a Medical Advance Directive? Yes Yes Yes Yes Yes Yes Yes  Type of AParamedicof ATiltonLiving will HPowerLiving will HPlainLiving will [place]taatsburgLiving will HAinaloaLiving will [place]morencyLiving will HImlay CityLiving will  Does patient  want to make changes to medical advance directive?      No - Patient declined No - Patient declined  Copy of [place] Chart? Yes - validated most recent copy scanned in chart (See row information) Yes - validated most recent copy scanned in chart (See row information) Yes - validated most recent copy scanned in chart (See row information) Yes - validated most recent copy scanned in chart (See row information) No - copy requested  No - copy requested    Current Medications (verified) Outpatient Encounter Medications as of 12/23/2022  Medication Sig   acetaminophen (TYLENOL) 500 MG tablet Take 500 mg by mouth every 6 (six) hours as needed for moderate pain.   cyanocobalamin (,VITAMIN B-12,) 1000 MCG/ML injection Inject 1,000 mcg into the muscle See admin instructions. Every 6 or 8 week   diltiazem (CARDIZEM CD) 180 MG 24 hr capsule TAKE 1 CAPSULE BY MOUTH EVERY DAY   ELIQUIS 5 MG TABS tablet TAKE 1 TABLET(5 MG) BY MOUTH TWICE DAILY   linaclotide (LINZESS) 72 MCG capsule Take 1 capsule (72 mcg total) by mouth daily before breakfast.   lisinopril-hydrochlorothiazide (ZESTORETIC) 10-12.5 MG tablet TAKE 1 TABLET BY MOUTH EVERY DAY   Propylene Glycol (SYSTANE COMPLETE OP) Place 1 drop into both eyes daily as needed (dry eyes).   Cholecalciferol (VITAMIN D) 50 MCG (2000 UT) tablet Take 2,000 Units by mouth daily. (Patient not taking: Reported on 12/23/2022)   rosuvastatin (CRESTOR) 20 MG tablet Take 1 tablet (20 mg total) by mouth daily.   simvastatin (ZOCOR) 40 MG tablet Take 40 mg by  mouth at bedtime. (Patient not taking: Reported on 12/23/2022)   No facility-administered encounter medications on file as of 12/23/2022.    Allergies (verified) Penicillins   History: Past Medical History:  Diagnosis Date   Degenerative disc disease, lumbar    Diabetes mellitus without complication ([place])    Hypertension    Past Surgical History:  Procedure Laterality Date   KNEE SURGERY      Family History  Problem Relation Age of Onset   Hyperlipidemia Father    Social History   Socioeconomic History   Marital status: Widowed    Spouse name: Not on file   Number of children: Not on file   Years of education: Not on file   Highest education level: Not on file  Occupational History   Occupation: retired  Tobacco Use   Smoking status: Never   Smokeless tobacco: Never  Vaping Use   Vaping Use: Never used  Substance and Sexual Activity   Alcohol use: No    Alcohol/week: 0.0 standard drinks of alcohol   Drug use: Never   Sexual activity: Not Currently  Other Topics Concern   Not on file  Social History Narrative   Not on file   Social Determinants of Health   Financial Resource Strain: [place]  (12/23/2022)   Overall Financial Resource Strain (CARDIA)    Difficulty of Paying Living Expenses: Not hard at all  Food Insecurity: No Food Insecurity (12/23/2022)   Hunger Vital Sign    Worried About Running Out of Food in the Last Year: Never true    [place] in the Last Year: Never true  Transportation Needs: No Transportation Needs (12/23/2022)   PRAPARE - Hydrologist (Medical): No    Lack of Transportation (Non-Medical): No  Physical Activity: Inactive (12/23/2022)   Exercise Vital Sign    Days of Exercise per Week: 0 days    Minutes of Exercise per Session: 0 min  Stress: No Stress Concern Present (12/23/2022)   [place]    Feeling of Stress : Not at all  Social Connections: Not on file    Tobacco Counseling Counseling given: Not Answered   Clinical Intake:  Pre-visit preparation completed: Yes  Pain : No/denies pain     Nutritional Status: BMI of 19-24  Normal Nutritional Risks: None Diabetes: Yes  How often do you need to have someone help you when you read instructions, pamphlets, or other written materials from your doctor or  pharmacy?: 1 - Never  Diabetic? Yes Nutrition Risk Assessment:  Has the patient had any N/V/D within the last 2 months?  No  Does the patient have any non-healing wounds?  No  Has the patient had any unintentional weight loss or weight gain?  No   Diabetes:  Is the patient diabetic?  Yes  If diabetic, was a CBG obtained today?  No  Did the patient bring in their glucometer from home?  No  How often do you monitor your CBG's? daily.   Financial Strains and Diabetes Management:  Are you having any financial strains with the device, your supplies or your medication? No .  Does the patient want to be seen by Chronic Care Management for management of their diabetes?  No  Would the patient like to be referred to a Nutritionist or for Diabetic Management?  No   Diabetic Exams:  Diabetic Eye Exam: Completed 03/15/2022 Diabetic Foot Exam: Completed  10/22/2021   Interpreter Needed?: No  Information entered by :: NAllen LPN   Activities of Daily Living    12/23/2022   11:43 AM 01/01/2022    7:00 PM  In your present state of health, do you have any difficulty performing the following activities:  Hearing? 1 1  Comment hearing aids   Vision? 0 0  Difficulty concentrating or making decisions? 0 1  Walking or climbing stairs? 1 1  Dressing or bathing? 0 0  Doing errands, shopping? 1 1  Comment does not drive   Preparing Food and eating ? N   Using the Toilet? N   In the past six months, have you accidently leaked urine? Y   Do you have problems with loss of bowel control? N   Managing your Medications? N   Managing your Finances? N   Housekeeping or managing your Housekeeping? N     Patient Care Team: [name], MD as PCP - General (Internal Medicine) Stanford Breed Denice Bors, MD as PCP - Cardiology (Cardiology) Sheard, Briscoe Burns, DPM (Inactive) as Consulting Physician (Podiatry)  Indicate any recent Medical Services you may have received from other than [facility] providers in the  past year (date may be approximate).     Assessment:   This is a routine wellness examination for [place].  Hearing/Vision screen Vision Screening - Comments:: Regular eye exams, [facility]  Dietary issues and exercise activities discussed: Current Exercise Habits: The patient does not participate in regular exercise at present   Goals Addressed             This Visit's Progress    Patient Stated       12/23/2022, working on balance       Depression Screen    12/23/2022   11:43 AM 12/15/2022    2:52 PM 12/10/2021    2:04 PM 12/03/2020    3:33 PM 12/03/2020    2:51 PM 11/14/2019    2:12 PM 10/03/2019    2:48 PM  PHQ 2/9 Scores  PHQ - 2 Score 0 0 0 0 0 0 0  PHQ- 9 Score      0     Fall Risk    12/23/2022   11:43 AM 12/15/2022    2:51 PM 12/10/2021    2:04 PM 12/03/2021    2:57 PM 12/03/2020    3:33 PM  Fall Risk   Falls in the past year? 0 0 0 0 0  Number falls in past yr: 0 0  0   Injury with Fall? 0 0  0   Risk for fall due to : Impaired balance/gait;Impaired mobility;Medication side effect No Fall Risks Impaired balance/gait;Impaired mobility;Medication side effect Impaired balance/gait;Other (Comment) Medication side effect;Impaired mobility  Risk for fall due to: Comment    uses a rollator walker   Follow up Falls prevention discussed;Education provided;Falls evaluation completed Falls evaluation completed Falls evaluation completed;Education provided;Falls prevention discussed  Falls evaluation completed;Education provided;Falls prevention discussed    FALL RISK PREVENTION PERTAINING TO THE HOME:  Any stairs in or around the home? No  If so, are there any without handrails? N/a Home free of loose throw rugs in walkways, pet beds, electrical cords, etc? Yes  Adequate lighting in your home to reduce risk of falls? Yes   ASSISTIVE DEVICES UTILIZED TO PREVENT FALLS:  Life alert? No  Use of a cane, walker or w/c? Yes  Grab bars in the bathroom? Yes   Shower chair or bench in shower?  Yes  Elevated toilet seat or a handicapped toilet? Yes   TIMED UP AND GO:  Was the test performed? No .      Cognitive Function:        12/23/2022   11:46 AM 12/10/2021    2:06 PM 12/03/2020    3:35 PM 11/14/2019    2:15 PM 09/28/2018   11:18 AM  6CIT Screen  What Year? 0 points 0 points 0 points 0 points 0 points  What month? 0 points 0 points 0 points 0 points 0 points  What time? 0 points 0 points 0 points 0 points 0 points  Count back from 20 0 points 0 points 0 points 0 points 0 points  Months in reverse 0 points 0 points 0 points 0 points 0 points  Repeat phrase 4 points 4 points 0 points 0 points 0 points  Total Score 4 points 4 points 0 points 0 points 0 points    Immunizations Immunization History  Administered Date(s) Administered   Fluad Quad(high Dose 65+) 10/04/2021, 09/07/2022   Influenza, High Dose Seasonal PF 10/01/2015, 10/09/2016, 10/07/2017, 09/28/2018, 10/03/2019, 10/20/2020   PFIZER(Purple Top)SARS-COV-2 Vaccination 01/15/2020, 02/05/2020, 10/20/2020   PNEUMOCOCCAL CONJUGATE-20 06/10/2022   Pneumococcal Polysaccharide-23 07/03/2019   Tdap 11/15/2021    TDAP status: Up to date  Flu Vaccine status: Up to date  Pneumococcal vaccine status: Up to date  Covid-19 vaccine status: Completed vaccines  Qualifies for Shingles Vaccine? Yes   Zostavax completed No   Shingrix Completed?: No.    Education has been provided regarding the importance of this vaccine. Patient has been advised to call insurance company to determine out of pocket expense if they have not yet received this vaccine. Advised may also receive vaccine at local pharmacy or Health Dept. Verbalized acceptance and understanding.  Screening Tests Health Maintenance  Topic Date Due   Zoster Vaccines- Shingrix (1 of 2) Never done   COVID-19 Vaccine (4 - 2023-24 season) 08/27/2022   FOOT EXAM  10/22/2022   Medicare Annual Wellness (AWV)  12/10/2022    OPHTHALMOLOGY EXAM  03/16/2023   HEMOGLOBIN A1C  06/16/2023   DTaP/Tdap/Td (2 - Td or Tdap) 11/16/2031   Pneumonia Vaccine 80+ Years old  Completed   INFLUENZA VACCINE  Completed   DEXA SCAN  Completed   HPV VACCINES  Aged Out    Health Maintenance  Health Maintenance Due  Topic Date Due   Zoster Vaccines- Shingrix (1 of 2) Never done   COVID-19 Vaccine (4 - 2023-24 season) 08/27/2022   FOOT EXAM  10/22/2022   Medicare Annual Wellness (AWV)  12/10/2022    Colorectal cancer screening: No longer required.   Mammogram status: No longer required due to age.  Bone Density status: Completed 01/31/2001.   Lung Cancer Screening: (Low Dose CT Chest recommended if Age 21-80 years, 30 pack-year currently smoking OR have quit w/in 15years.) does not qualify.   Lung Cancer Screening Referral: no  Additional Screening:  Hepatitis C Screening: does not qualify;   Vision Screening: Recommended annual ophthalmology exams for early detection of glaucoma and other disorders of the eye. Is the patient up to date with their annual eye exam?  Yes  Who is the provider or what is the name of the office in which the patient attends annual eye exams? [facility] If pt is not established with a provider, would they like to be referred to a provider to establish care? No .   Dental Screening: Recommended annual dental exams  for proper oral hygiene  Community Resource Referral / Chronic Care Management: CRR required this visit?  No   CCM required this visit?  No      Plan:     I have personally reviewed and noted the following in the patient's chart:   Medical and social history Use of alcohol, tobacco or illicit drugs  Current medications and supplements including opioid prescriptions. Patient is not currently taking opioid prescriptions. Functional ability and status Nutritional status Physical activity Advanced directives List of other physicians Hospitalizations, surgeries, and  ER visits in previous 12 months Vitals Screenings to include cognitive, depression, and falls Referrals and appointments  In addition, I have reviewed and discussed with patient certain preventive protocols, quality metrics, and best practice recommendations. A written personalized care plan for preventive services as well as general preventive health recommendations were provided to patient.     Kellie Simmering, LPN   [id number]   Nurse Notes: none  Due to this being a virtual visit, the after visit summary with patients personalized plan was offered to patient via mail or my-chart.  Patient would like to access on my-chart

## 2022-12-23 NOTE — Patient Instructions (Signed)
Molly Meyer , Thank you for taking time to come for your Medicare Wellness Visit. I appreciate your ongoing commitment to your health goals. Please review the following plan we discussed and let me know if I can assist you in the future.   These are the goals we discussed:  Goals       DIET - INCREASE WATER INTAKE (pt-stated)      Work on pushing water intake. Daughter helps with water reminders.      DIET - INCREASE WATER INTAKE      11/14/2019, still trying to increase water      Patient Stated      12/03/2020, wants to eat healthier      Patient Stated      12/10/2021, keep getting up      Patient Stated      12/23/2022, working on balance        This is a list of the screening recommended for you and due dates:  Health Maintenance  Topic Date Due   Zoster (Shingles) Vaccine (1 of 2) Never done   COVID-19 Vaccine (4 - 2023-24 season) 08/27/2022   Complete foot exam   10/22/2022   Eye exam for diabetics  03/16/2023   Hemoglobin A1C  06/16/2023   Medicare Annual Wellness Visit  12/24/2023   DTaP/Tdap/Td vaccine (2 - Td or Tdap) 11/16/2031   Pneumonia Vaccine  Completed   Flu Shot  Completed   DEXA scan (bone density measurement)  Completed   HPV Vaccine  Aged Out    Advanced directives: copy in chart  Conditions/risks identified: none  Next appointment: Follow up in one year for your annual wellness visit    Preventive Care 48 Years and Older, Female Preventive care refers to lifestyle choices and visits with your health care provider that can promote health and wellness. What does preventive care include? A yearly physical exam. This is also called an annual well check. Dental exams once or twice a year. Routine eye exams. Ask your health care provider how often you should have your eyes checked. Personal lifestyle choices, including: Daily care of your teeth and gums. Regular physical activity. Eating a healthy diet. Avoiding tobacco and drug use. Limiting  alcohol use. Practicing safe sex. Taking low-dose aspirin every day. Taking vitamin and mineral supplements as recommended by your health care provider. What happens during an annual well check? The services and screenings done by your health care provider during your annual well check will depend on your age, overall health, lifestyle risk factors, and family history of disease. Counseling  Your health care provider may ask you questions about your: Alcohol use. Tobacco use. Drug use. Emotional well-being. Home and relationship well-being. Sexual activity. Eating habits. History of falls. Memory and ability to understand (cognition). Work and work Statistician. Reproductive health. Screening  You may have the following tests or measurements: Height, weight, and BMI. Blood pressure. Lipid and cholesterol levels. These may be checked every 5 years, or more frequently if you are over 48 years old. Skin check. Lung cancer screening. You may have this screening every year starting at age 74 if you have a 30-pack-year history of smoking and currently smoke or have quit within the past 15 years. Fecal occult blood test (FOBT) of the stool. You may have this test every year starting at age 67. Flexible sigmoidoscopy or colonoscopy. You may have a sigmoidoscopy every 5 years or a colonoscopy every 10 years starting at age 77. Hepatitis C  blood test. Hepatitis B blood test. Sexually transmitted disease (STD) testing. Diabetes screening. This is done by checking your blood sugar (glucose) after you have not eaten for a while (fasting). You may have this done every 1-3 years. Bone density scan. This is done to screen for osteoporosis. You may have this done starting at age 75. Mammogram. This may be done every 1-2 years. Talk to your health care provider about how often you should have regular mammograms. Talk with your health care provider about your test results, treatment options, and if  necessary, the need for more tests. Vaccines  Your health care provider may recommend certain vaccines, such as: Influenza vaccine. This is recommended every year. Tetanus, diphtheria, and acellular pertussis (Tdap, Td) vaccine. You may need a Td booster every 10 years. Zoster vaccine. You may need this after age 54. Pneumococcal 13-valent conjugate (PCV13) vaccine. One dose is recommended after age 91. Pneumococcal polysaccharide (PPSV23) vaccine. One dose is recommended after age 39. Talk to your health care provider about which screenings and vaccines you need and how often you need them. This information is not intended to replace advice given to you by your health care provider. Make sure you discuss any questions you have with your health care provider. Document Released: 01/09/2016 Document Revised: 09/01/2016 Document Reviewed: 10/14/2015 Elsevier Interactive Patient Education  2017 [employer] Prevention in the Home Falls can cause injuries. They can happen to people of all ages. There are many things you can do to make your home safe and to help prevent falls. What can I do on the outside of my home? Regularly fix the edges of walkways and driveways and fix any cracks. Remove anything that might make you trip as you walk through a door, such as a raised step or threshold. Trim any bushes or trees on the path to your home. Use bright outdoor lighting. Clear any walking paths of anything that might make someone trip, such as rocks or tools. Regularly check to see if handrails are loose or broken. Make sure that both sides of any steps have handrails. Any raised decks and porches should have guardrails on the edges. Have any leaves, snow, or ice cleared regularly. Use sand or salt on walking paths during winter. Clean up any spills in your garage right away. This includes oil or grease spills. What can I do in the bathroom? Use night lights. Install grab bars by the toilet  and in the tub and shower. Do not use towel bars as grab bars. Use non-skid mats or decals in the tub or shower. If you need to sit down in the shower, use a plastic, non-slip stool. Keep the floor dry. Clean up any water that spills on the floor as soon as it happens. Remove soap buildup in the tub or shower regularly. Attach bath mats securely with double-sided non-slip rug tape. Do not have throw rugs and other things on the floor that can make you trip. What can I do in the bedroom? Use night lights. Make sure that you have a light by your bed that is easy to reach. Do not use any sheets or blankets that are too big for your bed. They should not hang down onto the floor. Have a firm chair that has side arms. You can use this for support while you get dressed. Do not have throw rugs and other things on the floor that can make you trip. What can I do in the kitchen?  Clean up any spills right away. Avoid walking on wet floors. Keep items that you use a lot in easy-to-reach places. If you need to reach something above you, use a strong step stool that has a grab bar. Keep electrical cords out of the way. Do not use floor polish or wax that makes floors slippery. If you must use wax, use non-skid floor wax. Do not have throw rugs and other things on the floor that can make you trip. What can I do with my stairs? Do not leave any items on the stairs. Make sure that there are handrails on both sides of the stairs and use them. Fix handrails that are broken or loose. Make sure that handrails are as long as the stairways. Check any carpeting to make sure that it is firmly attached to the stairs. Fix any carpet that is loose or worn. Avoid having throw rugs at the top or bottom of the stairs. If you do have throw rugs, attach them to the floor with carpet tape. Make sure that you have a light switch at the top of the stairs and the bottom of the stairs. If you do not have them, ask someone to add  them for you. What else can I do to help prevent falls? Wear shoes that: Do not have high heels. Have rubber bottoms. Are comfortable and fit you well. Are closed at the toe. Do not wear sandals. If you use a stepladder: Make sure that it is fully opened. Do not climb a closed stepladder. Make sure that both sides of the stepladder are locked into place. Ask someone to hold it for you, if possible. Clearly mark and make sure that you can see: Any grab bars or handrails. First and last steps. Where the edge of each step is. Use tools that help you move around (mobility aids) if they are needed. These include: Canes. Walkers. Scooters. Crutches. Turn on the lights when you go into a dark area. Replace any light bulbs as soon as they burn out. Set up your furniture so you have a clear path. Avoid moving your furniture around. If any of your floors are uneven, fix them. If there are any pets around you, be aware of where they are. Review your medicines with your doctor. Some medicines can make you feel dizzy. This can increase your chance of falling. Ask your doctor what other things that you can do to help prevent falls. This information is not intended to replace advice given to you by your health care provider. Make sure you discuss any questions you have with your health care provider. Document Released: 10/09/2009 Document Revised: 05/20/2016 Document Reviewed: 01/17/2015 Elsevier Interactive Patient Education  2017 [employer].

## 2022-12-31 ENCOUNTER — Other Ambulatory Visit: Payer: Self-pay | Admitting: Cardiology

## 2023-01-26 ENCOUNTER — Ambulatory Visit (INDEPENDENT_AMBULATORY_CARE_PROVIDER_SITE_OTHER): Payer: Medicare Other | Admitting: Podiatry

## 2023-01-26 ENCOUNTER — Encounter: Payer: Self-pay | Admitting: Podiatry

## 2023-01-26 DIAGNOSIS — M79674 Pain in right toe(s): Secondary | ICD-10-CM | POA: Diagnosis not present

## 2023-01-26 DIAGNOSIS — L600 Ingrowing nail: Secondary | ICD-10-CM

## 2023-01-26 DIAGNOSIS — M2041 Other hammer toe(s) (acquired), right foot: Secondary | ICD-10-CM

## 2023-01-26 DIAGNOSIS — M2010 Hallux valgus (acquired), unspecified foot: Secondary | ICD-10-CM

## 2023-01-26 DIAGNOSIS — M2042 Other hammer toe(s) (acquired), left foot: Secondary | ICD-10-CM

## 2023-01-26 DIAGNOSIS — E119 Type 2 diabetes mellitus without complications: Secondary | ICD-10-CM

## 2023-01-26 DIAGNOSIS — E1142 Type 2 diabetes mellitus with diabetic polyneuropathy: Secondary | ICD-10-CM

## 2023-01-26 DIAGNOSIS — B351 Tinea unguium: Secondary | ICD-10-CM

## 2023-01-26 DIAGNOSIS — M2141 Flat foot [pes planus] (acquired), right foot: Secondary | ICD-10-CM

## 2023-01-26 DIAGNOSIS — M79675 Pain in left toe(s): Secondary | ICD-10-CM

## 2023-01-26 DIAGNOSIS — M2142 Flat foot [pes planus] (acquired), left foot: Secondary | ICD-10-CM

## 2023-01-26 NOTE — Progress Notes (Signed)
ANNUAL DIABETIC FOOT EXAM  Subjective: Molly Meyer presents today for annual diabetic foot examination.  Chief Complaint  Patient presents with   diabetic foot care   Patient relates 5 year h/o diabetes.  Patient denies any h/o foot wounds.  Patient denies any numbness, tingling, burning, or pins/needle sensation in feet.  Risk factors: diabetes, diabetic neuropathy, HTN, CKD, dyslipidemia, hypercholesterolemia, diabetic renal disease.  [name], MD is patient's PCP.   Past Medical History:  Diagnosis Date   Degenerative disc disease, lumbar    Diabetes mellitus without complication ([place])    Hypertension    Patient Active Problem List   Diagnosis Date Noted   Family history of colon cancer 10/13/2022   Hypertensive heart and renal disease 06/30/2022   Cellulitis 01/01/2022   Atrial fibrillation with rapid ventricular response ([place]) 12/31/2021   Vitamin B12 deficiency 12/28/2021   Abnormal gait due to peripheral sensory disorder 08/21/2021   Lumbar radiculopathy 08/21/2021   Hypertension 08/06/2021   Diabetes mellitus without complication ([place]) 54/62/7035   Degenerative disc disease, lumbar 08/06/2021   Screening for malignant neoplasm of colon 03/20/2021   Diverticular disease of colon 12/16/2020   Family history of malignant neoplasm of gastrointestinal tract 12/16/2020   Urge incontinence of urine 06/16/2020   Dyslipidemia 09/14/2019   AAA (abdominal aortic aneurysm) without rupture 10/16/2018   Type 2 diabetes mellitus with chronic kidney disease ([place]) 09/15/2018   Benign hypertension with CKD (chronic kidney disease), stage II 09/15/2018   Nephropathy 09/15/2018   Onychomycosis 01/01/2015   Subluxation of foot, acquired 01/01/2015   Pain in lower limb 01/01/2015   Diabetic neuropathy associated with diabetes mellitus due to underlying condition ([place]) 01/01/2015   Disorder of kidney 07/21/2014   Malaise and fatigue 05/05/2014   Disorder of nasal cavity  04/28/2014   Acute upper respiratory infection 04/28/2014   Seborrheic keratosis 10/18/2013   Diabetic kidney ([place]) 10/18/2013   Benign hypertensive kidney disease 10/18/2013   Chronic kidney disease (CKD), stage II (mild) 10/18/2013   Hypercholesterolemia without hypertriglyceridemia 10/18/2013   Disorder of bone and articular cartilage 10/18/2013   Arthralgia of multiple joints 10/18/2013   Frank hematuria 10/18/2013   Chronic kidney disease, stage 2 (mild) 10/18/2013   Overweight (BMI 25.0-29.9) 10/17/2013   Past Surgical History:  Procedure Laterality Date   KNEE SURGERY     Current Outpatient Medications on File Prior to Visit  Medication Sig Dispense Refill   acetaminophen (TYLENOL) 500 MG tablet Take 500 mg by mouth every 6 (six) hours as needed for moderate pain.     Cholecalciferol (VITAMIN D) 50 MCG (2000 UT) tablet Take 2,000 Units by mouth daily.     cyanocobalamin (,VITAMIN B-12,) 1000 MCG/ML injection Inject 1,000 mcg into the muscle See admin instructions. Every 6 or 8 week     diltiazem (CARDIZEM CD) 180 MG 24 hr capsule TAKE 1 CAPSULE BY MOUTH EVERY DAY 90 capsule 2   ELIQUIS 5 MG TABS tablet TAKE 1 TABLET(5 MG) BY MOUTH TWICE DAILY 60 tablet 5   linaclotide (LINZESS) 72 MCG capsule Take 1 capsule (72 mcg total) by mouth daily before breakfast. 30 capsule 5   lisinopril-hydrochlorothiazide (ZESTORETIC) 10-12.5 MG tablet TAKE 1 TABLET BY MOUTH EVERY DAY 90 tablet 2   Propylene Glycol (SYSTANE COMPLETE OP) Place 1 drop into both eyes daily as needed (dry eyes).     rosuvastatin (CRESTOR) 20 MG tablet TAKE 1 TABLET(20 MG) BY MOUTH DAILY 90 tablet 3   simvastatin (ZOCOR) 40  MG tablet Take 40 mg by mouth at bedtime.     No current facility-administered medications on file prior to visit.    Allergies  Allergen Reactions   Penicillins Rash    Thinks she may be able to take some forms now.Has patient had a PCN reaction causing immediate rash, facial/tongue/throat  swelling, SOB or lightheadedness with hypotension: No Has patient had a PCN reaction causing severe rash involving mucus membranes or skin necrosis: No Has patient had a PCN reaction that required hospitalization No Has patient had a PCN reaction occurring within the last 10 years: No If all of the above answers are "NO", then may proceed with Cephalosporin use.   Social History   Occupational History   Occupation: retired  Tobacco Use   Smoking status: Never   Smokeless tobacco: Never  Vaping Use   Vaping Use: Never used  Substance and Sexual Activity   Alcohol use: No    Alcohol/week: 0.0 standard drinks of alcohol   Drug use: Never   Sexual activity: Not Currently   Family History  Problem Relation Age of Onset   Hyperlipidemia Father    Immunization History  Administered Date(s) Administered   Fluad Quad(high Dose 65+) 10/04/2021, 09/07/2022   Influenza, High Dose Seasonal PF 10/01/2015, 10/09/2016, 10/07/2017, 09/28/2018, 10/03/2019, 10/20/2020   PFIZER(Purple Top)SARS-COV-2 Vaccination 01/15/2020, 02/05/2020, 10/20/2020   PNEUMOCOCCAL CONJUGATE-20 06/10/2022   Pneumococcal Polysaccharide-23 07/03/2019   Tdap 11/15/2021    Review of Systems: Negative except as noted in the HPI.   Objective: There were no vitals filed for this visit.  Molly Meyer is a pleasant 89 y.o. female in NAD. AAO X 3.  Vascular Examination: Capillary refill time immediate b/l. Vascular status intact b/l with palpable pedal pulses. Pedal hair absent b/l. No edema. No pain with calf compression b/l. Skin temperature gradient WNL b/l. No cyanosis or clubbing noted b/l LE.  Neurological Examination: Vibratory sensation intact b/l. Protective sensation diminished with 10g monofilament b/l.  Dermatological Examination: Pedal skin with normal turgor, texture and tone b/l.  No open wounds. No interdigital macerations.   Toenails 2-5 b/l thick, discolored, elongated with subungual debris and  pain on dorsal palpation.   Incurvated nailplate both borders of left hallux and both borders of right hallux.  Nail border hypertrophy absent. There is tenderness to palpation. Sign(s) of infection: no clinical signs of infection noted on examination today..  Musculoskeletal Examination: Muscle strength 5/5 to all lower extremity muscle groups bilaterally. HAV with bunion deformity noted b/l LE. Hammertoe deformity noted 1-5 b/l. Pes planus deformity noted bilateral LE. Utilizes rollator for ambulation assistance.  Radiographs: None  Last A1c:      Latest Ref Rng & Units 12/15/2022    3:27 PM 06/10/2022    2:26 PM  Hemoglobin A1C  Hemoglobin-A1c 4.8 - 5.6 % 6.4  6.6    Footwear Assessment: Does the patient wear appropriate shoes? Yes. Does the patient need inserts/orthotics? Yes.  ADA Risk Categorization:  High Risk  Patient has one or more of the following: Loss of protective sensation Absent pedal pulses Severe Foot deformity History of foot ulcer  Assessment: 1. Pain due to onychomycosis of toenails of both feet   2. Ingrown toenail without infection   3. Acquired hammertoes of both feet   4. Acquired hallux valgus, unspecified laterality   5. Diabetic peripheral neuropathy associated with type 2 diabetes mellitus ([place])   6. Encounter for diabetic foot exam [facility])     Plan: -Patient was  evaluated and treated. All patient's and/or POA's questions/concerns answered on today's visit. -Diabetic foot examination performed today. -Continue diabetic foot care principles: inspect feet daily, monitor glucose as recommended by PCP and/or Endocrinologist, and follow prescribed diet per PCP, Endocrinologist and/or dietician. -Continue diabetic shoes daily. -Toenails 2-5 bilaterally debrided in length and girth without iatrogenic bleeding with sterile nail nipper and dremel.  -Discussed chronicity of ingrown toenail(s) of bilateral great toes. Recommended patient consider having  matrixectomy performed to alleviate chronic ingrown toenail(s). Discussed in-office procedure and post-procedure instructions. Patient states they will think about it. -No invasive procedure(s) performed. Offending nail border debrided and curretaged bilateral great toes utilizing sterile nail nipper and currette. Border(s) cleansed with alcohol and triple antibiotic ointment applied. Patient/POA/Caregiver/Facility instructed to apply Neosporin Cream  to bilateral great toes once daily for 7 days. Call office if there are any concerns. -Patient/POA to call should there be question/concern in the interim. Return in about 3 months (around 04/26/2023).  Marzetta Board, DPM

## 2023-01-26 NOTE — Progress Notes (Signed)
Patient presents to the office today for diabetic shoe and insole measuring.  Patient was measured with brannock device to determine size and width for 1 pair of extra depth shoes and foam casted for 3 pair of insoles.   ABN signed.   Documentation of medical necessity will be sent to patient's treating diabetic doctor to verify and sign.   Patient's diabetic provider: [name], MD   Shoes and insoles will be ordered at that time and patient will be notified for an appointment for fitting when they arrive.   Patient shoe selection-   1st   Shoe choice:   [id number]

## 2023-03-09 ENCOUNTER — Ambulatory Visit (INDEPENDENT_AMBULATORY_CARE_PROVIDER_SITE_OTHER): Payer: Medicare Other

## 2023-03-09 DIAGNOSIS — M2042 Other hammer toe(s) (acquired), left foot: Secondary | ICD-10-CM | POA: Diagnosis not present

## 2023-03-09 DIAGNOSIS — M2041 Other hammer toe(s) (acquired), right foot: Secondary | ICD-10-CM | POA: Diagnosis not present

## 2023-03-09 DIAGNOSIS — E1142 Type 2 diabetes mellitus with diabetic polyneuropathy: Secondary | ICD-10-CM

## 2023-03-09 DIAGNOSIS — M2142 Flat foot [pes planus] (acquired), left foot: Secondary | ICD-10-CM

## 2023-03-09 DIAGNOSIS — M2141 Flat foot [pes planus] (acquired), right foot: Secondary | ICD-10-CM

## 2023-03-09 NOTE — Progress Notes (Signed)
Patient presents today to pick up diabetic shoes and insoles.  Patient was dispensed 1 pair of diabetic shoes and 3 pairs of foam casted diabetic insoles.   She tried on the shoes with the insoles and the fit was satisfactory.   Will follow up next year for new order.    

## 2023-03-11 ENCOUNTER — Other Ambulatory Visit: Payer: Medicare Other

## 2023-03-16 ENCOUNTER — Telehealth: Payer: Self-pay | Admitting: *Deleted

## 2023-03-16 NOTE — Telephone Encounter (Signed)
Patient is calling to let the physician know that her new shoes are causing a little pressure but she likes them and would like to keep, please advise.

## 2023-03-17 NOTE — Telephone Encounter (Signed)
Per Dr Elisha Ponder, can schedule appt with Christan to stretch them a little for her.

## 2023-03-28 ENCOUNTER — Other Ambulatory Visit: Payer: Medicare Other

## 2023-04-01 ENCOUNTER — Telehealth (INDEPENDENT_AMBULATORY_CARE_PROVIDER_SITE_OTHER): Payer: Medicare Other | Admitting: Podiatry

## 2023-04-01 DIAGNOSIS — H524 Presbyopia: Secondary | ICD-10-CM | POA: Diagnosis not present

## 2023-04-01 DIAGNOSIS — H04122 Dry eye syndrome of left lacrimal gland: Secondary | ICD-10-CM | POA: Diagnosis not present

## 2023-04-01 DIAGNOSIS — H52203 Unspecified astigmatism, bilateral: Secondary | ICD-10-CM | POA: Diagnosis not present

## 2023-04-01 DIAGNOSIS — E119 Type 2 diabetes mellitus without complications: Secondary | ICD-10-CM | POA: Diagnosis not present

## 2023-04-01 DIAGNOSIS — H353132 Nonexudative age-related macular degeneration, bilateral, intermediate dry stage: Secondary | ICD-10-CM | POA: Diagnosis not present

## 2023-04-01 DIAGNOSIS — H26493 Other secondary cataract, bilateral: Secondary | ICD-10-CM | POA: Diagnosis not present

## 2023-04-01 LAB — HM DIABETES EYE EXAM

## 2023-04-01 NOTE — Telephone Encounter (Signed)
Returned patient's phone call. Patient states problem has resolved. She was worried about her diabetic shoes causing pain on her toes, but states shoes feel fine now. She states today she is not having any problems. Noted. She is to keep her appt scheduled for next month. She was advised to call office again should she have any problems. She related understanding.

## 2023-04-18 ENCOUNTER — Encounter: Payer: Self-pay | Admitting: Internal Medicine

## 2023-04-18 ENCOUNTER — Ambulatory Visit (INDEPENDENT_AMBULATORY_CARE_PROVIDER_SITE_OTHER): Payer: Medicare Other | Admitting: Internal Medicine

## 2023-04-18 VITALS — BP 104/78 | HR 89 | Temp 98.0°F | Ht 68.0 in | Wt 153.8 lb

## 2023-04-18 DIAGNOSIS — E538 Deficiency of other specified B group vitamins: Secondary | ICD-10-CM

## 2023-04-18 DIAGNOSIS — N182 Chronic kidney disease, stage 2 (mild): Secondary | ICD-10-CM | POA: Diagnosis not present

## 2023-04-18 DIAGNOSIS — L989 Disorder of the skin and subcutaneous tissue, unspecified: Secondary | ICD-10-CM

## 2023-04-18 DIAGNOSIS — I714 Abdominal aortic aneurysm, without rupture, unspecified: Secondary | ICD-10-CM

## 2023-04-18 DIAGNOSIS — E1122 Type 2 diabetes mellitus with diabetic chronic kidney disease: Secondary | ICD-10-CM | POA: Diagnosis not present

## 2023-04-18 DIAGNOSIS — Z6823 Body mass index (BMI) 23.0-23.9, adult: Secondary | ICD-10-CM | POA: Diagnosis not present

## 2023-04-18 DIAGNOSIS — D6869 Other thrombophilia: Secondary | ICD-10-CM | POA: Diagnosis not present

## 2023-04-18 DIAGNOSIS — K5909 Other constipation: Secondary | ICD-10-CM

## 2023-04-18 DIAGNOSIS — Z2821 Immunization not carried out because of patient refusal: Secondary | ICD-10-CM | POA: Diagnosis not present

## 2023-04-18 DIAGNOSIS — Z79899 Other long term (current) drug therapy: Secondary | ICD-10-CM

## 2023-04-18 DIAGNOSIS — I4821 Permanent atrial fibrillation: Secondary | ICD-10-CM | POA: Diagnosis not present

## 2023-04-18 DIAGNOSIS — I1310 Hypertensive heart and chronic kidney disease without heart failure, with stage 1 through stage 4 chronic kidney disease, or unspecified chronic kidney disease: Secondary | ICD-10-CM | POA: Diagnosis not present

## 2023-04-18 NOTE — Progress Notes (Signed)
Hershal Coria Martin,acting as a Neurosurgeon for Gwynneth Aliment, MD.,have documented all relevant documentation on the behalf of Gwynneth Aliment, MD,as directed by  Gwynneth Aliment, MD while in the presence of Gwynneth Aliment, MD.    Subjective:     Patient ID: Molly Meyer , female    DOB: [birth year] , 89 y.o.   MRN: [id number]   Chief Complaint  Patient presents with   Diabetes   Hypertension    HPI  She presents today for DM/HTN check. She reports compliance with meds. She is alone today. Denies headaches, chest pain and shortness of breath. She would like her ears checked today, she wants to make sure there is no wax buildup.   BP Readings from Last 3 Encounters: 04/18/23 : 104/78 12/15/22 : 110/72 10/11/22 : 112/72    Diabetes She presents for her follow-up diabetic visit. She has type 2 diabetes mellitus. Her disease course has been stable. There are no hypoglycemic associated symptoms. Pertinent negatives for hypoglycemia include no dizziness. Pertinent negatives for diabetes include no blurred vision and no chest pain. There are no hypoglycemic complications. Diabetic complications include nephropathy. Risk factors for coronary artery disease include diabetes mellitus, dyslipidemia, hypertension, post-menopausal and sedentary lifestyle. Her weight is stable. Her breakfast blood glucose is taken between 8-9 am. Her breakfast blood glucose range is generally 90-110 mg/dl. An ACE inhibitor/angiotensin II receptor blocker is being taken. She sees a podiatrist.Eye exam is current.  Hypertension This is a chronic problem. The current episode started more than 1 year ago. The problem has been gradually improving since onset. The problem is controlled. Pertinent negatives include no blurred vision, chest pain, palpitations or shortness of breath.     Past Medical History:  Diagnosis Date   Degenerative disc disease, lumbar    Diabetes mellitus without complication    Hypertension       Family History  Problem Relation Age of Onset   Hyperlipidemia Father      Current Outpatient Medications:    diltiazem (CARDIZEM CD) 180 MG 24 hr capsule, TAKE 1 CAPSULE BY MOUTH EVERY DAY, Disp: 90 capsule, Rfl: 2   ELIQUIS 5 MG TABS tablet, TAKE 1 TABLET(5 MG) BY MOUTH TWICE DAILY, Disp: 60 tablet, Rfl: 5   linaclotide (LINZESS) 72 MCG capsule, Take 1 capsule (72 mcg total) by mouth daily before breakfast., Disp: 30 capsule, Rfl: 5   lisinopril-hydrochlorothiazide (ZESTORETIC) 10-12.5 MG tablet, TAKE 1 TABLET BY MOUTH EVERY DAY, Disp: 90 tablet, Rfl: 2   rosuvastatin (CRESTOR) 20 MG tablet, TAKE 1 TABLET(20 MG) BY MOUTH DAILY, Disp: 90 tablet, Rfl: 3   acetaminophen (TYLENOL) 500 MG tablet, Take 500 mg by mouth every 6 (six) hours as needed for moderate pain. (Patient not taking: Reported on 04/18/2023), Disp: , Rfl:    Cholecalciferol (VITAMIN D) 50 MCG (2000 UT) tablet, Take 2,000 Units by mouth daily. (Patient not taking: Reported on 04/18/2023), Disp: , Rfl:    cyanocobalamin (,VITAMIN B-12,) 1000 MCG/ML injection, Inject 1,000 mcg into the muscle See admin instructions. Every 6 or 8 week (Patient not taking: Reported on 04/18/2023), Disp: , Rfl:    Propylene Glycol (SYSTANE COMPLETE OP), Place 1 drop into both eyes daily as needed (dry eyes). (Patient not taking: Reported on 04/18/2023), Disp: , Rfl:    simvastatin (ZOCOR) 40 MG tablet, Take 40 mg by mouth at bedtime. (Patient not taking: Reported on 04/18/2023), Disp: , Rfl:    Allergies  Allergen Reactions   Penicillins  Rash    Thinks she may be able to take some forms now.Has patient had a PCN reaction causing immediate rash, facial/tongue/throat swelling, SOB or lightheadedness with hypotension: No Has patient had a PCN reaction causing severe rash involving mucus membranes or skin necrosis: No Has patient had a PCN reaction that required hospitalization No Has patient had a PCN reaction occurring within the last 10 years: No If  all of the above answers are "NO", then may proceed with Cephalosporin use.     Review of Systems  Constitutional: Negative.   HENT:         Wears hearing aids  Eyes:  Negative for blurred vision.  Respiratory: Negative.  Negative for shortness of breath.   Cardiovascular: Negative.  Negative for chest pain and palpitations.  Musculoskeletal: Negative.   Neurological: Negative.  Negative for dizziness.  Psychiatric/Behavioral: Negative.       Today's Vitals   04/18/23 1142  BP: 104/78  Pulse: 89  Temp: 98 F (36.7 C)  TempSrc: Oral  Weight: 153 lb 12.8 oz (69.8 kg)  Height:  (1.727 m)  PainSc: 0-No pain   Body mass index is 23.39 kg/m.  Wt Readings from Last 3 Encounters:  04/18/23 153 lb 12.8 oz (69.8 kg)  12/23/22 155 lb (70.3 kg)  12/15/22 154 lb (69.9 kg)    Objective:  Physical Exam Vitals and nursing note reviewed.  Constitutional:      Appearance: Normal appearance.  HENT:     Head: Normocephalic and atraumatic.  Eyes:     Extraocular Movements: Extraocular movements intact.  Cardiovascular:     Rate and Rhythm: Normal rate. Rhythm irregular.     Heart sounds: Normal heart sounds.  Pulmonary:     Effort: Pulmonary effort is normal.     Breath sounds: Normal breath sounds.  Musculoskeletal:     Comments: Ambulatory with waler  Skin:    General: Skin is warm.     Comments: Papular lesion r nose, scaly No vesicular lesions noted  Neurological:     General: No focal deficit present.     Mental Status: She is alert.  Psychiatric:        Mood and Affect: Mood normal.        Behavior: Behavior normal.       Assessment And Plan:     1. Type 2 diabetes mellitus with stage 2 chronic kidney disease, without long-term current use of insulin Comments: Chronic, well controlled. She has done well off of medication. I will check labs as below. She will f/u in 4 months. - Hemoglobin A1c - Lipid panel - CMP14+EGFR  2. Hypertensive heart and renal  disease with renal failure, stage 1 through stage 4 or unspecified chronic kidney disease, without heart failure Comments: Chronic, well controlled.  She will c/w lisinopril/hct 10/12.5mg  and diltiazem daily. Reminded to follow low sodium diet. She will f/u in 4 months.  3. Permanent atrial fibrillation Comments: Chronic, she is properly rate controlled and anticoagulated w/ Eliquis.  4. Abdominal aortic aneurysm (AAA) without rupture, unspecified part Comments: Chronic, last measurement 6.3 cm. She declines f/u imaging. Importance of optimal BP control was again stressed to the patient.  5. Lesion of skin of nose Comments: I will refer her to Derm for further evaluation. Pt and daughter agree w/ tx plan. - Ambulatory referral to Dermatology  6. Acquired thrombophilia Comments: Chronic, currently on Eliquis due to underlying atrial fibrillation.  7. BMI 23.0-23.9, adult Comments: She is encouraged  to perform chair exercises while watching TV.  8. Herpes zoster vaccination declined  9. Drug therapy - Vitamin B12   Patient was given opportunity to ask questions. Patient verbalized understanding of the plan and was able to repeat key elements of the plan. All questions were answered to their satisfaction.   I, Gwynneth Aliment, MD, have reviewed all documentation for this visit. The documentation on 04/18/23 for the exam, diagnosis, procedures, and orders are all accurate and complete.   IF YOU HAVE BEEN REFERRED TO A SPECIALIST, IT MAY TAKE 1-2 WEEKS TO SCHEDULE/PROCESS THE REFERRAL. IF YOU HAVE NOT HEARD FROM US/SPECIALIST IN TWO WEEKS, PLEASE GIVE [place] A CALL AT [phone] X [phone].   THE PATIENT IS ENCOURAGED TO PRACTICE SOCIAL DISTANCING DUE TO THE COVID-19 PANDEMIC.

## 2023-04-18 NOTE — Patient Instructions (Signed)

## 2023-04-19 LAB — CMP14+EGFR
ALT: 25 IU/L (ref 0–32)
AST: 28 IU/L (ref 0–40)
Albumin/Globulin Ratio: 1.7 (ref 1.2–2.2)
Albumin: 3.9 g/dL (ref 3.6–4.6)
Alkaline Phosphatase: 49 IU/L (ref 44–121)
BUN/Creatinine Ratio: 25 (ref 12–28)
BUN: 21 mg/dL (ref 10–36)
Bilirubin Total: 0.5 mg/dL (ref 0.0–1.2)
CO2: 22 mmol/L (ref 20–29)
Calcium: 9.5 mg/dL (ref 8.7–10.3)
Chloride: 102 mmol/L (ref 96–106)
Creatinine, Ser: 0.85 mg/dL (ref 0.57–1.00)
Globulin, Total: 2.3 g/dL (ref 1.5–4.5)
Glucose: 160 mg/dL — ABNORMAL HIGH (ref 70–99)
Potassium: 4 mmol/L (ref 3.5–5.2)
Sodium: 139 mmol/L (ref 134–144)
Total Protein: 6.2 g/dL (ref 6.0–8.5)
eGFR: 62 mL/min/{1.73_m2} (ref >59)

## 2023-04-19 LAB — LIPID PANEL
Chol/HDL Ratio: 2.2 ratio (ref 0.0–4.4)
Cholesterol, Total: 130 mg/dL (ref 100–199)
HDL: 60 mg/dL (ref >39)
LDL Chol Calc (NIH): 56 mg/dL (ref 0–99)
Triglycerides: 71 mg/dL (ref 0–149)
VLDL Cholesterol Cal: 14 mg/dL (ref 5–40)

## 2023-04-19 LAB — HEMOGLOBIN A1C
Est. average glucose Bld gHb Est-mCnc: 137 mg/dL
Hgb A1c MFr Bld: 6.4 % — ABNORMAL HIGH (ref 4.8–5.6)

## 2023-04-19 LAB — VITAMIN B12: Vitamin B-12: 410 pg/mL (ref 232–1245)

## 2023-04-30 ENCOUNTER — Other Ambulatory Visit: Payer: Self-pay | Admitting: Internal Medicine

## 2023-05-04 ENCOUNTER — Ambulatory Visit (INDEPENDENT_AMBULATORY_CARE_PROVIDER_SITE_OTHER): Payer: Medicare Other | Admitting: Podiatry

## 2023-05-04 ENCOUNTER — Encounter: Payer: Self-pay | Admitting: Podiatry

## 2023-05-04 VITALS — BP 115/86

## 2023-05-04 DIAGNOSIS — M79675 Pain in left toe(s): Secondary | ICD-10-CM | POA: Diagnosis not present

## 2023-05-04 DIAGNOSIS — L600 Ingrowing nail: Secondary | ICD-10-CM | POA: Diagnosis not present

## 2023-05-04 DIAGNOSIS — M79674 Pain in right toe(s): Secondary | ICD-10-CM

## 2023-05-04 DIAGNOSIS — E1142 Type 2 diabetes mellitus with diabetic polyneuropathy: Secondary | ICD-10-CM

## 2023-05-04 DIAGNOSIS — B351 Tinea unguium: Secondary | ICD-10-CM | POA: Diagnosis not present

## 2023-05-04 NOTE — Patient Instructions (Signed)
EPSOM SALT FOOT SOAK INSTRUCTIONS    Shopping List:  A. Plain epsom salt (not scented) B. Neosporin Cream   Place 1/4 cup of epsom salts in 2 quarts of warm tap water. IF YOU ARE DIABETIC, OR HAVE NEUROPATHY, CHECK THE TEMPERATURE OF THE WATER WITH YOUR ELBOW.   Submerge your foot/feet in the solution and soak for 10-15 minutes.      3.  Next, remove your foot/feet from solution, blot dry the affected area.    4.  Apply light amount of antibiotic cream/ointment and cover with fabric band-aid .  5.  This soak should be done once a day for 1-2 days.   6.  Monitor for any signs/symptoms of infection such as redness, swelling, odor, drainage, increased pain, or non-healing of digit.   7.  Please do not hesitate to call the office and speak to a Nurse or Doctor if you have questions.   8.  If you experience fever, chills, nightsweats, nausea or vomiting with worsening of digit/foot, please go to the emergency room.

## 2023-05-09 NOTE — Progress Notes (Signed)
  Subjective:  Patient ID: Molly Meyer, female    DOB: Feb 06, 1924,  MRN: [id number]  Molly Meyer presents to clinic today for at risk foot care with history of diabetic neuropathy and painful, discolored, thick toenails which interfere with daily activities  Chief Complaint  Patient presents with   Nail Problem    Rfc,Sanders, Robyn, Md,a1c-6.4,does not take B/S daily,LPCPOV:04/24   New problem(s): ingrown toenail to the bilateral great toes; patient states toes are sore. Denies any redness, drainage or swelling.  PCP is [name], MD.  Allergies  Allergen Reactions   Penicillins Rash    Thinks she may be able to take some forms now.Has patient had a PCN reaction causing immediate rash, facial/tongue/throat swelling, SOB or lightheadedness with hypotension: No Has patient had a PCN reaction causing severe rash involving mucus membranes or skin necrosis: No Has patient had a PCN reaction that required hospitalization No Has patient had a PCN reaction occurring within the last 10 years: No If all of the above answers are "NO", then may proceed with Cephalosporin use.    Review of Systems: Negative except as noted in the HPI.  Objective: No changes noted in today's physical examination. Vitals:   05/04/23 1624  BP: 115/86   Molly Meyer is a pleasant 89 y.o. female WD, WN in NAD. AAO x 3.  Vascular Examination: Capillary refill time immediate b/l. Vascular status intact b/l with palpable pedal pulses. Pedal hair absent b/l. No edema. No pain with calf compression b/l. Skin temperature gradient WNL b/l. No cyanosis or clubbing noted b/l LE.  Neurological Examination: Vibratory sensation intact b/l. Protective sensation diminished with 10g monofilament b/l.  Dermatological Examination: Pedal skin with normal turgor, texture and tone b/l.  No open wounds. No interdigital macerations.   Toenails 2-5 b/l thick, discolored, elongated with subungual debris and pain on  dorsal palpation.   Incurvated nailplate both borders of left hallux and both borders of right hallux.  Nail border hypertrophy absent. There is tenderness to palpation. Sign(s) of infection: no clinical signs of infection noted on examination today.  Musculoskeletal Examination: Muscle strength 5/5 to all lower extremity muscle groups bilaterally. HAV with bunion deformity noted b/l LE. Hammertoe deformity noted 1-5 b/l. Pes planus deformity noted bilateral LE. Utilizes rollator for ambulation assistance.  Radiographs: None Assessment/Plan: 1. Pain due to onychomycosis of toenails of both feet   2. Ingrown toenail without infection   3. Diabetic peripheral neuropathy associated with type 2 diabetes mellitus (HCC)     -Patient was evaluated and treated. All patient's and/or POA's questions/concerns answered on today's visit. -Toenails were debrided in length and girth 2-5 bilaterally with sterile nail nippers and dremel without iatrogenic bleeding.  -No invasive procedure(s) performed. Offending nail border debrided and curretaged bilateral great toes utilizing sterile nail nipper and currette. Border(s) cleansed with alcohol and triple antibiotic ointment applied. Dispensed written instructions for once daily epsom salt soaks for 1-2 days. Call office if there are any concerns. -Patient/POA to call should there be question/concern in the interim.   Return in about 3 months (around 08/04/2023).  Molly Meyer, DPM

## 2023-05-30 ENCOUNTER — Other Ambulatory Visit: Payer: Self-pay | Admitting: Cardiology

## 2023-05-30 DIAGNOSIS — I4891 Unspecified atrial fibrillation: Secondary | ICD-10-CM

## 2023-05-30 NOTE — Telephone Encounter (Signed)
Prescription refill request for Eliquis received. Indication:afib Last office visit:10/23 Scr:0.96  12/23 Age: 89 Weight:69.8  kg  Prescription refilled

## 2023-06-20 ENCOUNTER — Other Ambulatory Visit: Payer: Self-pay | Admitting: Cardiology

## 2023-06-20 DIAGNOSIS — I4821 Permanent atrial fibrillation: Secondary | ICD-10-CM

## 2023-07-11 NOTE — Progress Notes (Signed)
HPI: Follow-up atrial fibrillation. Abdominal ultrasound June 2020 showed 5.3 x 5.65 cm abdominal aortic aneurysm.  Patient has elected to not pursue any intervention and therefore follow-up studies not performed.  Patient recently admitted with lower extremity cellulitis and noted to be in new onset atrial fibrillation.  She was treated with Cardizem and apixaban.  Echocardiogram 1/23 showed normal LV function, mild biatrial enlargement, mild mitral regurgitation, mild aortic insufficiency and abdominal aortic aneurysm measuring 63 mm.  Patient also with history of diabetes mellitus, hypertension and hyperlipidemia.  Also note she is in no CODE BLUE.  Since she was last seen, she denies dyspnea, chest pain, palpitations or syncope.  No bleeding.  She has not fallen.  Current Outpatient Medications  Medication Sig Dispense Refill   acetaminophen (TYLENOL) 500 MG tablet Take 500 mg by mouth every 6 (six) hours as needed for moderate pain.     ELIQUIS 5 MG TABS tablet TAKE 1 TABLET(5 MG) BY MOUTH TWICE DAILY 60 tablet 5   linaclotide (LINZESS) 72 MCG capsule Take 1 capsule (72 mcg total) by mouth daily before breakfast. 30 capsule 5   lisinopril-hydrochlorothiazide (ZESTORETIC) 10-12.5 MG tablet TAKE 1 TABLET BY MOUTH EVERY DAY 90 tablet 2   Propylene Glycol (SYSTANE COMPLETE OP) Place 1 drop into both eyes daily as needed (dry eyes).     rosuvastatin (CRESTOR) 20 MG tablet TAKE 1 TABLET(20 MG) BY MOUTH DAILY 90 tablet 3   simvastatin (ZOCOR) 40 MG tablet Take 40 mg by mouth at bedtime.     Cholecalciferol (VITAMIN D) 50 MCG (2000 UT) tablet Take 2,000 Units by mouth daily. (Patient not taking: Reported on 07/19/2023)     cyanocobalamin (,VITAMIN B-12,) 1000 MCG/ML injection Inject 1,000 mcg into the muscle See admin instructions. Every 6 or 8 week (Patient not taking: Reported on 07/19/2023)     diltiazem (CARDIZEM CD) 180 MG 24 hr capsule TAKE 1 CAPSULE BY MOUTH EVERY DAY 30 capsule 0   No  current facility-administered medications for this visit.     Past Medical History:  Diagnosis Date   Degenerative disc disease, lumbar    Diabetes mellitus without complication (HCC)    Hypertension     Past Surgical History:  Procedure Laterality Date   KNEE SURGERY      Social History   Socioeconomic History   Marital status: Widowed    Spouse name: Not on file   Number of children: Not on file   Years of education: Not on file   Highest education level: Not on file  Occupational History   Occupation: retired  Tobacco Use   Smoking status: Never   Smokeless tobacco: Never  Vaping Use   Vaping status: Never Used  Substance and Sexual Activity   Alcohol use: No    Alcohol/week: 0.0 standard drinks of alcohol   Drug use: Never   Sexual activity: Not Currently  Other Topics Concern   Not on file  Social History Narrative   Not on file   Social Determinants of Health   Financial Resource Strain: Low Risk  (12/23/2022)   Overall Financial Resource Strain (CARDIA)    Difficulty of Paying Living Expenses: Not hard at all  Food Insecurity: No Food Insecurity (12/23/2022)   Hunger Vital Sign    Worried About Running Out of Food in the Last Year: Never true    Ran Out of Food in the Last Year: Never true  Transportation Needs: No Transportation Needs (12/23/2022)  PRAPARE - Administrator, Civil Service (Medical): No    Lack of Transportation (Non-Medical): No  Physical Activity: Inactive (12/23/2022)   Exercise Vital Sign    Days of Exercise per Week: 0 days    Minutes of Exercise per Session: 0 min  Stress: No Stress Concern Present (12/23/2022)   [employer] of Occupational Health - Occupational Stress Questionnaire    Feeling of Stress : Not at all  Social Connections: Not on file  Intimate Partner Violence: Not At Risk (09/28/2018)   Humiliation, Afraid, Rape, and Kick questionnaire    Fear of Current or Ex-Partner: No    Emotionally  Abused: No    Physically Abused: No    Sexually Abused: No    Family History  Problem Relation Age of Onset   Hyperlipidemia Father     ROS: no fevers or chills, productive cough, hemoptysis, dysphasia, odynophagia, melena, hematochezia, dysuria, hematuria, rash, seizure activity, orthopnea, PND, pedal edema, claudication. Remaining systems are negative.  Physical Exam: Well-developed well-nourished in no acute distress.  Skin is warm and dry.  HEENT is normal.  Neck is supple.  Chest is clear to auscultation with normal expansion.  Cardiovascular exam is irregular Abdominal exam nontender or distended. No masses palpated. Extremities show no edema. neuro grossly intact   A/P  1 permanent atrial fibrillation-continue Cardizem for rate control.  We will continue with anticoagulation.  We previously discussed the risk of rupture of the large abdominal aortic aneurysm but she is more concerned about the potential for CVA in the setting of atrial fibrillation. Check CBC and renal function.  2 abdominal aortic aneurysm-we have elected not to pursue follow-up imaging as she is 89 years old and would not be agreeable to any procedures to correct the aneurysm.  Management would therefore not be changed.  3 hypertension-blood pressure controlled.  Continue present medical regimen.  4 no CODE BLUE  5 hyperlipidemia-continue statin.  Olga Millers, MD

## 2023-07-19 ENCOUNTER — Encounter: Payer: Self-pay | Admitting: Cardiology

## 2023-07-19 ENCOUNTER — Ambulatory Visit: Payer: Medicare Other | Attending: Cardiology | Admitting: Cardiology

## 2023-07-19 VITALS — BP 100/70 | HR 75 | Ht 68.0 in | Wt 150.8 lb

## 2023-07-19 DIAGNOSIS — I4821 Permanent atrial fibrillation: Secondary | ICD-10-CM | POA: Insufficient documentation

## 2023-07-19 DIAGNOSIS — I714 Abdominal aortic aneurysm, without rupture, unspecified: Secondary | ICD-10-CM | POA: Insufficient documentation

## 2023-07-19 DIAGNOSIS — I10 Essential (primary) hypertension: Secondary | ICD-10-CM | POA: Diagnosis not present

## 2023-07-19 DIAGNOSIS — E785 Hyperlipidemia, unspecified: Secondary | ICD-10-CM | POA: Insufficient documentation

## 2023-07-19 NOTE — Patient Instructions (Signed)

## 2023-07-20 LAB — CBC
Hematocrit: 40.7 % (ref 34.0–46.6)
Hemoglobin: 13.7 g/dL (ref 11.1–15.9)
MCH: 30 pg (ref 26.6–33.0)
MCHC: 33.7 g/dL (ref 31.5–35.7)
MCV: 89 fL (ref 79–97)
Platelets: 163 10*3/uL (ref 150–450)
RBC: 4.57 x10E6/uL (ref 3.77–5.28)
RDW: 13 % (ref 11.7–15.4)
WBC: 4.6 10*3/uL (ref 3.4–10.8)

## 2023-07-20 LAB — BASIC METABOLIC PANEL
BUN/Creatinine Ratio: 18 (ref 12–28)
BUN: 16 mg/dL (ref 10–36)
CO2: 25 mmol/L (ref 20–29)
Calcium: 10 mg/dL (ref 8.7–10.3)
Chloride: 103 mmol/L (ref 96–106)
Creatinine, Ser: 0.89 mg/dL (ref 0.57–1.00)
Glucose: 119 mg/dL — ABNORMAL HIGH (ref 70–99)
Potassium: 4.4 mmol/L (ref 3.5–5.2)
Sodium: 141 mmol/L (ref 134–144)
eGFR: 59 mL/min/{1.73_m2} — ABNORMAL LOW (ref >59)

## 2023-08-10 ENCOUNTER — Encounter: Payer: Self-pay | Admitting: Podiatry

## 2023-08-10 ENCOUNTER — Ambulatory Visit: Payer: Medicare Other | Admitting: Podiatry

## 2023-08-10 DIAGNOSIS — B351 Tinea unguium: Secondary | ICD-10-CM | POA: Diagnosis not present

## 2023-08-10 DIAGNOSIS — M79675 Pain in left toe(s): Secondary | ICD-10-CM | POA: Diagnosis not present

## 2023-08-10 DIAGNOSIS — E1142 Type 2 diabetes mellitus with diabetic polyneuropathy: Secondary | ICD-10-CM | POA: Diagnosis not present

## 2023-08-10 DIAGNOSIS — M79674 Pain in right toe(s): Secondary | ICD-10-CM

## 2023-08-10 NOTE — Progress Notes (Signed)
  Subjective:  Patient ID: Molly Meyer, female    DOB: 1924-11-11,   MRN: [id number]  Chief Complaint  Patient presents with   Nail Problem    DFC    89 y.o. female presents for concern of thickened elongated and painful nails that are difficult to trim. Requesting to have them trimmed today. Relates burning and tingling in their feet. Patient is diabetic and last A1c was  Lab Results  Component Value Date   HGBA1C 6.4 (H) 04/18/2023   .   PCP:  [name], MD    . Denies any other pedal complaints. Denies n/v/f/c.   Past Medical History:  Diagnosis Date   Degenerative disc disease, lumbar    Diabetes mellitus without complication (HCC)    Hypertension     Objective:  Physical Exam: Vascular: DP/PT pulses 2/4 bilateral. CFT <3 seconds. Absent hair growth on digits. Edema noted to bilateral lower extremities. Xerosis noted bilaterally.  Skin. No lacerations or abrasions bilateral feet. Nails 1-5 bilateral  are thickened discolored and elongated with subungual debris.  Musculoskeletal: MMT 5/5 bilateral lower extremities in DF, PF, Inversion and Eversion. Deceased ROM in DF of ankle joint.  Neurological: Sensation intact to light touch. Protective sensation diminished bilateral.    Assessment:   1. Pain due to onychomycosis of toenails of both feet   2. Diabetic peripheral neuropathy associated with type 2 diabetes mellitus (HCC)      Plan:  Patient was evaluated and treated and all questions answered. -Discussed and educated patient on diabetic foot care, especially with  regards to the vascular, neurological and musculoskeletal systems.  -Stressed the importance of good glycemic control and the detriment of not  controlling glucose levels in relation to the foot. -Discussed supportive shoes at all times and checking feet regularly.  -Mechanically debrided all nails 1-5 bilateral using sterile nail nipper and filed with dremel without incident  -Answered all  patient questions -Patient to return  in 3 months for at risk foot care -Patient advised to call the office if any problems or questions arise in the meantime.   Louann Sjogren, DPM

## 2023-08-17 NOTE — Patient Instructions (Addendum)
HOLD SIMVASTATIN X 10 DAYS  REDUCE ELIQUIS 5MG  1/2 TAB TWICE DAILY X 6 DAYS  TAKE LORATADINE ONCE DAILY X 7-10 DAYS (PRESCRIPTION SENT TO PHARMACY)  PAXLOVID SENT TO PHARMACY - TREATMENT FOR COVID  Type 2 Diabetes Mellitus, Diagnosis, Adult Type 2 diabetes (type 2 diabetes mellitus) is a long-term, or chronic, disease. In type 2 diabetes, one or both of these problems may be present: The pancreas does not make enough of a hormone called insulin. Cells in the body do not respond properly to the insulin that the body makes (insulin resistance). Normally, insulin allows blood sugar (glucose) to enter cells in the body. The cells use glucose for energy. Insulin resistance or lack of insulin causes excess glucose to build up in the blood instead of going into cells. This causes high blood glucose (hyperglycemia).  What are the causes? The exact cause of type 2 diabetes is not known. What increases the risk? The following factors may make you more likely to develop this condition: Having a family member with type 2 diabetes. Being overweight or obese. Being inactive (sedentary). Having been diagnosed with insulin resistance. Having a history of prediabetes, diabetes when you were pregnant (gestational diabetes), or polycystic ovary syndrome (PCOS). What are the signs or symptoms? In the early stage of this condition, you may not have symptoms. Symptoms develop slowly and may include: Increased thirst or hunger. Increased urination. Unexplained weight loss. Tiredness (fatigue) or weakness. Vision changes, such as blurry vision. Dark patches on the skin. How is this diagnosed? This condition is diagnosed based on your symptoms, your medical history, a physical exam, and your blood glucose level. Your blood glucose may be checked with one or more of the following blood tests: A fasting blood glucose (FBG) test. You will not be allowed to eat (you will fast) for 8 hours or longer before a blood  sample is taken. A random blood glucose test. This test checks blood glucose at any time of day regardless of when you ate. An A1C (hemoglobin A1C) blood test. This test provides information about blood glucose levels over the previous 2-3 months. An oral glucose tolerance test (OGTT). This test measures your blood glucose at two times: After fasting. This is your baseline blood glucose level. Two hours after drinking a beverage that contains glucose. You may be diagnosed with type 2 diabetes if: Your fasting blood glucose level is 126 mg/dL (7.0 mmol/L) or higher. Your random blood glucose level is 200 mg/dL (16.1 mmol/L) or higher. Your A1C level is 6.5% or higher. Your oral glucose tolerance test result is higher than 200 mg/dL (09.6 mmol/L). These blood tests may be repeated to confirm your diagnosis. How is this treated? Your treatment may be managed by a specialist called an endocrinologist. Type 2 diabetes may be treated by following instructions from your health care provider about: Making dietary and lifestyle changes. These may include: Following a personalized nutrition plan that is developed by a registered dietitian. Exercising regularly. Finding ways to manage stress. Checking your blood glucose level as often as told. Taking diabetes medicines or insulin daily. This helps to keep your blood glucose levels in the healthy range. Taking medicines to help prevent complications from diabetes. Medicines may include: Aspirin. Medicine to lower cholesterol. Medicine to control blood pressure. Your health care provider will set treatment goals for you. Your goals will be based on your age, other medical conditions you have, and how you respond to diabetes treatment. Generally, the goal of treatment  is to maintain the following blood glucose levels: Before meals: 80-130 mg/dL (2.9-5.2 mmol/L). After meals: below 180 mg/dL (10 mmol/L). A1C level: less than 7%. Follow these  instructions at home: Questions to ask your health care provider Consider asking the following questions: Should I meet with a certified diabetes care and education specialist? What diabetes medicines do I need, and when should I take them? What equipment will I need to manage my diabetes at home? How often do I need to check my blood glucose? Where can I find a support group for people with diabetes? What number can I call if I have questions? When is my next appointment? General instructions Take over-the-counter and prescription medicines only as told by your health care provider. Keep all follow-up visits. This is important. Where to find more information For help and guidance and for more information about diabetes, please visit: American Diabetes Association (ADA): www.diabetes.org American Association of Diabetes Care and Education Specialists (ADCES): www.diabeteseducator.org International Diabetes Federation (IDF): DCOnly.dk Contact a health care provider if: Your blood glucose is at or above 240 mg/dL (84.1 mmol/L) for 2 days in a row. You have been sick or have had a fever for 2 days or longer, and you are not getting better. You have any of the following problems for more than 6 hours: You cannot eat or drink. You have nausea and vomiting. You have diarrhea. Get help right away if: You have severe hypoglycemia. This means your blood glucose is lower than 54 mg/dL (3.0 mmol/L). You become confused or you have trouble thinking clearly. You have difficulty breathing. You have moderate or large ketone levels in your urine. These symptoms may represent a serious problem that is an emergency. Do not wait to see if the symptoms will go away. Get medical help right away. Call your local emergency services (911 in the [place] Do not drive yourself to the hospital. Summary Type 2 diabetes mellitus is a long-term, or chronic, disease. In type 2 diabetes, the pancreas does not make  enough of a hormone called insulin, or cells in the body do not respond properly to insulin that the body makes. This condition is treated by making dietary and lifestyle changes and taking diabetes medicines or insulin. Your health care provider will set treatment goals for you. Your goals will be based on your age, other medical conditions you have, and how you respond to diabetes treatment. Keep all follow-up visits. This is important. This information is not intended to replace advice given to you by your health care provider. Make sure you discuss any questions you have with your health care provider. Document Revised: 03/09/2021 Document Reviewed: 03/09/2021 Elsevier Patient Education  2024 [employer].

## 2023-08-18 ENCOUNTER — Encounter: Payer: Self-pay | Admitting: Internal Medicine

## 2023-08-18 ENCOUNTER — Ambulatory Visit (INDEPENDENT_AMBULATORY_CARE_PROVIDER_SITE_OTHER): Payer: Medicare Other | Admitting: Internal Medicine

## 2023-08-18 VITALS — BP 100/70 | HR 76 | Temp 98.2°F | Ht 68.0 in | Wt 148.2 lb

## 2023-08-18 DIAGNOSIS — L989 Disorder of the skin and subcutaneous tissue, unspecified: Secondary | ICD-10-CM

## 2023-08-18 DIAGNOSIS — E1122 Type 2 diabetes mellitus with diabetic chronic kidney disease: Secondary | ICD-10-CM

## 2023-08-18 DIAGNOSIS — I4821 Permanent atrial fibrillation: Secondary | ICD-10-CM

## 2023-08-18 DIAGNOSIS — Z79899 Other long term (current) drug therapy: Secondary | ICD-10-CM

## 2023-08-18 DIAGNOSIS — U071 COVID-19: Secondary | ICD-10-CM

## 2023-08-18 DIAGNOSIS — I1310 Hypertensive heart and chronic kidney disease without heart failure, with stage 1 through stage 4 chronic kidney disease, or unspecified chronic kidney disease: Secondary | ICD-10-CM

## 2023-08-18 DIAGNOSIS — N182 Chronic kidney disease, stage 2 (mild): Secondary | ICD-10-CM | POA: Diagnosis not present

## 2023-08-18 DIAGNOSIS — D6869 Other thrombophilia: Secondary | ICD-10-CM | POA: Diagnosis not present

## 2023-08-18 DIAGNOSIS — I714 Abdominal aortic aneurysm, without rupture, unspecified: Secondary | ICD-10-CM | POA: Diagnosis not present

## 2023-08-18 LAB — POC COVID19 BINAXNOW: SARS Coronavirus 2 Ag: POSITIVE — AB

## 2023-08-18 MED ORDER — NIRMATRELVIR/RITONAVIR (PAXLOVID)TABLET: 3.0000 | ORAL_TABLET | Freq: Two times a day (BID) | ORAL | 0 refills | Status: AC

## 2023-08-18 MED ORDER — LORATADINE 10 MG PO TABS: 10.0000 mg | ORAL_TABLET | Freq: Every day | ORAL | 0 refills | Status: DC | PRN

## 2023-08-18 NOTE — Progress Notes (Signed)
I,Victoria T Deloria Lair, CMA,acting as a Neurosurgeon for Gwynneth Aliment, MD.,have documented all relevant documentation on the behalf of Gwynneth Aliment, MD,as directed by  Gwynneth Aliment, MD while in the presence of Gwynneth Aliment, MD.  Subjective:  Patient ID: Molly Meyer , female    DOB: 1924/03/11 , 89 y.o.   MRN: [id number]  Chief Complaint  Patient presents with   Diabetes   Hypertension    HPI  She presents today for DM/HTN check. She reports compliance with meds. She is alone today. Denies chest pain and shortness of breath.  Today, she is most concerned about nasal congestion. She admits to having a runny nose, cough, headache, stomach and body aches. Her symptoms started yesterday, denies known ill contacts. She has taken Tylenol tablets at home to help with discomfort. Her symptoms have not improved with Tylenol.       Diabetes She presents for her follow-up diabetic visit. She has type 2 diabetes mellitus. Her disease course has been stable. There are no hypoglycemic associated symptoms. Pertinent negatives for hypoglycemia include no dizziness. Associated symptoms include fatigue. Pertinent negatives for diabetes include no blurred vision and no chest pain. There are no hypoglycemic complications. Diabetic complications include nephropathy. Risk factors for coronary artery disease include diabetes mellitus, dyslipidemia, hypertension, post-menopausal and sedentary lifestyle. Her weight is stable. Her breakfast blood glucose is taken between 8-9 am. Her breakfast blood glucose range is generally 90-110 mg/dl. An ACE inhibitor/angiotensin II receptor blocker is being taken. She sees a podiatrist.Eye exam is current.  Hypertension This is a chronic problem. The current episode started more than 1 year ago. The problem has been gradually improving since onset. The problem is controlled. Pertinent negatives include no blurred vision, chest pain, palpitations or shortness of breath.      Past Medical History:  Diagnosis Date   Degenerative disc disease, lumbar    Diabetes mellitus without complication (HCC)    Hypertension      Family History  Problem Relation Age of Onset   Hyperlipidemia Father      Current Outpatient Medications:    acetaminophen (TYLENOL) 500 MG tablet, Take 500 mg by mouth every 6 (six) hours as needed for moderate pain., Disp: , Rfl:    cyanocobalamin (,VITAMIN B-12,) 1000 MCG/ML injection, Inject 1,000 mcg into the muscle See admin instructions. Every 6 or 8 week, Disp: , Rfl:    ELIQUIS 5 MG TABS tablet, TAKE 1 TABLET(5 MG) BY MOUTH TWICE DAILY, Disp: 60 tablet, Rfl: 5   linaclotide (LINZESS) 72 MCG capsule, Take 1 capsule (72 mcg total) by mouth daily before breakfast., Disp: 30 capsule, Rfl: 5   lisinopril-hydrochlorothiazide (ZESTORETIC) 10-12.5 MG tablet, TAKE 1 TABLET BY MOUTH EVERY DAY, Disp: 90 tablet, Rfl: 2   Propylene Glycol (SYSTANE COMPLETE OP), Place 1 drop into both eyes daily as needed (dry eyes)., Disp: , Rfl:    rosuvastatin (CRESTOR) 20 MG tablet, TAKE 1 TABLET(20 MG) BY MOUTH DAILY, Disp: 90 tablet, Rfl: 3   Cholecalciferol (VITAMIN D) 50 MCG (2000 UT) tablet, Take 2,000 Units by mouth daily., Disp: , Rfl:    diltiazem (CARDIZEM CD) 180 MG 24 hr capsule, TAKE 1 CAPSULE BY MOUTH EVERY DAY, Disp: 90 capsule, Rfl: 3   loratadine (CLARITIN) 10 MG tablet, TAKE 1 TABLET BY MOUTH EVERY DAY AS NEEDED FOR ALLERGY, Disp: 30 tablet, Rfl: 0   simvastatin (ZOCOR) 40 MG tablet, Take 40 mg by mouth at bedtime. (Patient not taking: Reported  on 08/18/2023), Disp: , Rfl:    Allergies  Allergen Reactions   Penicillins Rash    Thinks she may be able to take some forms now.Has patient had a PCN reaction causing immediate rash, facial/tongue/throat swelling, SOB or lightheadedness with hypotension: No Has patient had a PCN reaction causing severe rash involving mucus membranes or skin necrosis: No Has patient had a PCN reaction that required  hospitalization No Has patient had a PCN reaction occurring within the last 10 years: No If all of the above answers are "NO", then may proceed with Cephalosporin use.     Review of Systems  Constitutional:  Positive for fatigue.  HENT:  Positive for congestion, postnasal drip and rhinorrhea.   Eyes:  Negative for blurred vision.  Respiratory:  Positive for cough. Negative for shortness of breath.   Cardiovascular: Negative.  Negative for chest pain and palpitations.  Neurological: Negative.  Negative for dizziness.  Psychiatric/Behavioral: Negative.       Today's Vitals   08/18/23 1114  BP: 100/70  Pulse: 76  Temp: 98.2 F (36.8 C)  SpO2: 98%  Weight: 148 lb 3.2 oz (67.2 kg)  Height: 5\' 8"  (1.727 m)   Body mass index is 22.53 kg/m.  Wt Readings from Last 3 Encounters:  08/18/23 148 lb 3.2 oz (67.2 kg)  07/19/23 150 lb 12.8 oz (68.4 kg)  04/18/23 153 lb 12.8 oz (69.8 kg)     Objective:  Physical Exam Vitals and nursing note reviewed.  Constitutional:      Appearance: Normal appearance. She is ill-appearing.  HENT:     Head: Normocephalic and atraumatic.  Eyes:     Extraocular Movements: Extraocular movements intact.  Cardiovascular:     Rate and Rhythm: Normal rate. Rhythm irregular.     Heart sounds: Normal heart sounds.  Pulmonary:     Effort: Pulmonary effort is normal.     Breath sounds: Normal breath sounds.  Musculoskeletal:     Cervical back: Normal range of motion.  Skin:    General: Skin is warm.  Neurological:     General: No focal deficit present.     Mental Status: She is alert.  Psychiatric:        Mood and Affect: Mood normal.        Behavior: Behavior normal.         Assessment And Plan:  Type 2 diabetes mellitus with stage 2 chronic kidney disease, without long-term current use of insulin (HCC) Assessment & Plan: Chronic, will check labs as below. She has been able to control her diabetes with lifestyle changes. She will f/u in four  months for re-evaluation.   Orders: -     Hemoglobin A1c -     TSH  Hypertensive heart and renal disease with renal failure, stage 1 through stage 4 or unspecified chronic kidney disease, without heart failure Assessment & Plan: Chronic, well controlled.  She will continue with lisinopril/hct 10/12.5mg  daily. Advised to follow a low sodium diet.    Permanent atrial fibrillation [facility]) Assessment & Plan: Chronic, she is rate controlled and properly anticoaguated. Cardiology input is appreciated. Most recent Cardiology notes reviewed in full detail.    Abdominal aortic aneurysm (AAA) without rupture, unspecified part [facility]) Assessment & Plan: Chronic, Cardiology and Vascular Surgery input appreciated. She is currently under observation. She is reminded of the importance of optimal blood pressure control.    Acquired thrombophilia (HCC) Assessment & Plan: Chronic, currently on Eliquis 5mg  twice daily as per Cardiology  due to underlying atrial fibrillation. Should consider dose decrease if her creatinine increases.    COVID Assessment & Plan:  She would like treatment, rx paxlovid sent to her pharmacy. Advised to take full course, possible side effects d/w patient. I will also refer her for home monitoring/temperature monitoring program. Both patient and her daughter are aware she has to hold simvastatin for TEN days.   She is also advised to take HALF TAB Eliquis twice daily for 5 days.  She is encouraged to go to ER should she develop worsening SOB. She should mask for 10 days. She is also advised to stay well hydrated, move periodically throughout the day and to have a hot beverage daily.  She verbalizes understanding of her treatment plan. All questions were answered to her satisfaction. She understands that she needs to continue to self quarantine    Orders: -     POC COVID-19 BinaxNow  Other orders -     nirmatrelvir/ritonavir; Take 3 tablets by mouth 2 (two) times daily for 5 days.  Patient GFR is 59.  Dispense: 30 tablet; Refill: 0     Return in about 4 months (around 12/18/2023).  Patient was given opportunity to ask questions. Patient verbalized understanding of the plan and was able to repeat key elements of the plan. All questions were answered to their satisfaction.    I, Gwynneth Aliment, MD, have reviewed all documentation for this visit. The documentation on 08/18/23 for the exam, diagnosis, procedures, and orders are all accurate and complete.   IF YOU HAVE BEEN REFERRED TO A SPECIALIST, IT MAY TAKE 1-2 WEEKS TO SCHEDULE/PROCESS THE REFERRAL. IF YOU HAVE NOT HEARD FROM US/SPECIALIST IN TWO WEEKS, PLEASE GIVE [place] A CALL AT [phone] X [phone].   THE PATIENT IS ENCOURAGED TO PRACTICE SOCIAL DISTANCING DUE TO THE COVID-19 PANDEMIC.

## 2023-08-19 ENCOUNTER — Other Ambulatory Visit: Payer: Self-pay | Admitting: Cardiology

## 2023-08-19 DIAGNOSIS — I4821 Permanent atrial fibrillation: Secondary | ICD-10-CM

## 2023-08-19 LAB — HEMOGLOBIN A1C
Est. average glucose Bld gHb Est-mCnc: 134 mg/dL
Hgb A1c MFr Bld: 6.3 % — ABNORMAL HIGH (ref 4.8–5.6)

## 2023-08-19 LAB — TSH: TSH: 1.32 u[IU]/mL (ref 0.450–4.500)

## 2023-08-24 ENCOUNTER — Other Ambulatory Visit: Payer: Self-pay | Admitting: Internal Medicine

## 2023-08-29 ENCOUNTER — Encounter: Payer: Self-pay | Admitting: Internal Medicine

## 2023-08-29 DIAGNOSIS — I4821 Permanent atrial fibrillation: Secondary | ICD-10-CM | POA: Insufficient documentation

## 2023-08-29 DIAGNOSIS — U071 COVID-19: Secondary | ICD-10-CM | POA: Insufficient documentation

## 2023-08-29 NOTE — Assessment & Plan Note (Signed)
Chronic, well controlled.  She will continue with lisinopril/hct 10/12.5mg  daily. Advised to follow a low sodium diet.

## 2023-08-29 NOTE — Assessment & Plan Note (Addendum)
Chronic, she is rate controlled and properly anticoaguated. Cardiology input is appreciated. Most recent Cardiology notes reviewed in full detail.

## 2023-08-29 NOTE — Assessment & Plan Note (Addendum)
She would like treatment, rx paxlovid sent to her pharmacy. Advised to take full course, possible side effects d/w patient. I will also refer her for home monitoring/temperature monitoring program. Both patient and her daughter are aware she has to hold simvastatin for TEN days.   She is also advised to take HALF TAB Eliquis twice daily for 5 days.  She is encouraged to go to ER should she develop worsening SOB. She should mask for 10 days. She is also advised to stay well hydrated, move periodically throughout the day and to have a hot beverage daily.  She verbalizes understanding of her treatment plan. All questions were answered to her satisfaction. She understands that she needs to continue to self quarantine

## 2023-08-29 NOTE — Assessment & Plan Note (Signed)
Chronic, will check labs as below. She has been able to control her diabetes with lifestyle changes. She will f/u in four months for re-evaluation.

## 2023-08-29 NOTE — Assessment & Plan Note (Signed)
Chronic, currently on Eliquis 5mg  twice daily as per Cardiology due to underlying atrial fibrillation. Should consider dose decrease if her creatinine increases.

## 2023-08-29 NOTE — Assessment & Plan Note (Signed)
Chronic, Cardiology and Vascular Surgery input appreciated. She is currently under observation. She is reminded of the importance of optimal blood pressure control.

## 2023-09-30 ENCOUNTER — Other Ambulatory Visit: Payer: Self-pay | Admitting: Internal Medicine

## 2023-10-17 ENCOUNTER — Encounter: Payer: Self-pay | Admitting: Internal Medicine

## 2023-10-17 ENCOUNTER — Ambulatory Visit (INDEPENDENT_AMBULATORY_CARE_PROVIDER_SITE_OTHER): Payer: Medicare Other | Admitting: Internal Medicine

## 2023-10-17 VITALS — BP 108/70 | HR 98 | Temp 97.5°F | Ht 68.0 in | Wt 148.0 lb

## 2023-10-17 DIAGNOSIS — D6869 Other thrombophilia: Secondary | ICD-10-CM | POA: Diagnosis not present

## 2023-10-17 DIAGNOSIS — H6122 Impacted cerumen, left ear: Secondary | ICD-10-CM | POA: Diagnosis not present

## 2023-10-17 DIAGNOSIS — K5909 Other constipation: Secondary | ICD-10-CM | POA: Diagnosis not present

## 2023-10-17 DIAGNOSIS — N182 Chronic kidney disease, stage 2 (mild): Secondary | ICD-10-CM | POA: Diagnosis not present

## 2023-10-17 DIAGNOSIS — I4821 Permanent atrial fibrillation: Secondary | ICD-10-CM | POA: Diagnosis not present

## 2023-10-17 DIAGNOSIS — Z23 Encounter for immunization: Secondary | ICD-10-CM | POA: Diagnosis not present

## 2023-10-17 DIAGNOSIS — I1310 Hypertensive heart and chronic kidney disease without heart failure, with stage 1 through stage 4 chronic kidney disease, or unspecified chronic kidney disease: Secondary | ICD-10-CM

## 2023-10-17 MED ORDER — LINACLOTIDE 72 MCG PO CAPS: 72.0000 ug | ORAL_CAPSULE | Freq: Every day | ORAL | 0 refills | Status: DC

## 2023-10-17 NOTE — Patient Instructions (Signed)

## 2023-10-17 NOTE — Assessment & Plan Note (Addendum)
Chronic, currently on diltiazem CD and Eliquis. Cardiology input appreciated. Patient advised of elevated heart rate - reminded to stay well hydrated and decrease caffeine intake.

## 2023-10-17 NOTE — Assessment & Plan Note (Signed)
Chronic, well controlled.  She will continue with lisinopril/hct 10/12.5mg  daily. Advised to follow a low sodium diet.

## 2023-10-17 NOTE — Assessment & Plan Note (Signed)
Chronic, currently on Eliquis 5mg  twice daily as per Cardiology due to underlying atrial fibrillation. Should consider dose decrease if her creatinine increases.

## 2023-10-17 NOTE — Progress Notes (Signed)
I,Victoria T Deloria Lair, CMA,acting as a Neurosurgeon for Gwynneth Aliment, MD.,have documented all relevant documentation on the behalf of Gwynneth Aliment, MD,as directed by  Gwynneth Aliment, MD while in the presence of Gwynneth Aliment, MD.  Subjective:  Patient ID: Molly Meyer , female    DOB: 1924-05-20 , 89 y.o.   MRN: [id number]  Chief Complaint  Patient presents with   Ear Flush    HPI  She presents today for ear flush. She feels it is time to have them flushed. She wears hearing aids bilaterally.  She feels the right ear needs it, but she would like both ears checked. She has no other concerns or complaints.        Hypertension This is a chronic problem. The current episode started more than 1 year ago. The problem has been gradually improving since onset. The problem is controlled. Pertinent negatives include no palpitations or shortness of breath.     Past Medical History:  Diagnosis Date   Degenerative disc disease, lumbar    Diabetes mellitus without complication (HCC)    Hypertension      Family History  Problem Relation Age of Onset   Hyperlipidemia Father      Current Outpatient Medications:    acetaminophen (TYLENOL) 500 MG tablet, Take 500 mg by mouth every 6 (six) hours as needed for moderate pain., Disp: , Rfl:    Cholecalciferol (VITAMIN D) 50 MCG (2000 UT) tablet, Take 2,000 Units by mouth daily., Disp: , Rfl:    cyanocobalamin (,VITAMIN B-12,) 1000 MCG/ML injection, Inject 1,000 mcg into the muscle See admin instructions. Every 6 or 8 week, Disp: , Rfl:    diltiazem (CARDIZEM CD) 180 MG 24 hr capsule, TAKE 1 CAPSULE BY MOUTH EVERY DAY, Disp: 90 capsule, Rfl: 3   ELIQUIS 5 MG TABS tablet, TAKE 1 TABLET(5 MG) BY MOUTH TWICE DAILY, Disp: 60 tablet, Rfl: 5   lisinopril-hydrochlorothiazide (ZESTORETIC) 10-12.5 MG tablet, TAKE 1 TABLET BY MOUTH EVERY DAY, Disp: 90 tablet, Rfl: 2   loratadine (CLARITIN) 10 MG tablet, TAKE 1 TABLET BY MOUTH EVERY DAY AS NEEDED FOR  ALLERGY, Disp: 90 tablet, Rfl: 1   Propylene Glycol (SYSTANE COMPLETE OP), Place 1 drop into both eyes daily as needed (dry eyes)., Disp: , Rfl:    rosuvastatin (CRESTOR) 20 MG tablet, TAKE 1 TABLET(20 MG) BY MOUTH DAILY, Disp: 90 tablet, Rfl: 3   linaclotide (LINZESS) 72 MCG capsule, Take 1 capsule (72 mcg total) by mouth daily before breakfast., Disp: 90 capsule, Rfl: 0   simvastatin (ZOCOR) 40 MG tablet, Take 40 mg by mouth at bedtime. (Patient not taking: Reported on 08/18/2023), Disp: , Rfl:    Allergies  Allergen Reactions   Penicillins Rash    Thinks she may be able to take some forms now.Has patient had a PCN reaction causing immediate rash, facial/tongue/throat swelling, SOB or lightheadedness with hypotension: No Has patient had a PCN reaction causing severe rash involving mucus membranes or skin necrosis: No Has patient had a PCN reaction that required hospitalization No Has patient had a PCN reaction occurring within the last 10 years: No If all of the above answers are "NO", then may proceed with Cephalosporin use.     Review of Systems  Constitutional: Negative.   HENT:  Positive for hearing loss.   Eyes: Negative.   Respiratory: Negative.  Negative for shortness of breath.   Cardiovascular: Negative.  Negative for palpitations.  Gastrointestinal:  Positive for constipation.  She has had recent bouts of constipation. Linzess 72mg  daily has worked in the past. Would like samples.   Musculoskeletal: Negative.   Skin: Negative.   Psychiatric/Behavioral: Negative.       Today's Vitals   10/17/23 0925  BP: 108/70  Pulse: 98  Temp: (!) 97.5 F (36.4 C)  SpO2: 98%  Weight: 148 lb (67.1 kg)  Height: 5\' 8"  (1.727 m)   Body mass index is 22.5 kg/m.  Wt Readings from Last 3 Encounters:  10/17/23 148 lb (67.1 kg)  08/18/23 148 lb 3.2 oz (67.2 kg)  07/19/23 150 lb 12.8 oz (68.4 kg)     Objective:  Physical Exam Vitals and nursing note reviewed.  Constitutional:       Appearance: Normal appearance.  HENT:     Head: Normocephalic and atraumatic.     Right Ear: Tympanic membrane, ear canal and external ear normal. There is no impacted cerumen.     Left Ear: Ear canal and external ear normal. There is impacted cerumen.  Eyes:     Extraocular Movements: Extraocular movements intact.  Cardiovascular:     Rate and Rhythm: Normal rate. Rhythm irregular.     Heart sounds: Normal heart sounds.  Pulmonary:     Effort: Pulmonary effort is normal.     Breath sounds: Normal breath sounds.  Musculoskeletal:     Cervical back: Normal range of motion.  Skin:    General: Skin is warm.  Neurological:     General: No focal deficit present.     Mental Status: She is alert.  Psychiatric:        Mood and Affect: Mood normal.        Behavior: Behavior normal.         Assessment And Plan:  Left ear impacted cerumen Assessment & Plan: AFTER OBTAINING VERBAL CONSENT, LEFT EAR WAS FLUSHED BY IRRIGATION. SHE TOLERATED PROCEDURE WELL WITHOUT ANY COMPLICATIONS. NO TM ABNORMALITIES WERE NOTED.   Orders: -     Ear Lavage  Chronic constipation Assessment & Plan: Chronic, sx are improved with Linzess 72mg  daily prn. She has never filled rx. She was given more samples and rx sent to the pharmacy.    Hypertensive heart and renal disease with renal failure, stage 1 through stage 4 or unspecified chronic kidney disease, without heart failure Assessment & Plan: Chronic, well controlled.  She will continue with lisinopril/hct 10/12.5mg  daily. Advised to follow a low sodium diet.    Permanent atrial fibrillation [facility]) Assessment & Plan: Chronic, currently on diltiazem CD and Eliquis. Cardiology input appreciated. Patient advised of elevated heart rate - reminded to stay well hydrated and decrease caffeine intake.    Chronic renal disease, stage II Assessment & Plan: Chronic, she is encouraged to keep BP well controlled and to stay well hydrated to decrease risk  of CKD progression.     Acquired thrombophilia (HCC) Assessment & Plan: Chronic, currently on Eliquis 5mg  twice daily as per Cardiology due to underlying atrial fibrillation. Should consider dose decrease if her creatinine increases.    Immunization due -     Flu Vaccine Trivalent High Dose (Fluad)  Other orders -     linaCLOtide; Take 1 capsule (72 mcg total) by mouth daily before breakfast.  Dispense: 90 capsule; Refill: 0     Return if symptoms worsen or fail to improve.  Patient was given opportunity to ask questions. Patient verbalized understanding of the plan and was able to repeat key elements of the plan.  All questions were answered to their satisfaction.    I, Gwynneth Aliment, MD, have reviewed all documentation for this visit. The documentation on 10/17/23 for the exam, diagnosis, procedures, and orders are all accurate and complete.   IF YOU HAVE BEEN REFERRED TO A SPECIALIST, IT MAY TAKE 1-2 WEEKS TO SCHEDULE/PROCESS THE REFERRAL. IF YOU HAVE NOT HEARD FROM US/SPECIALIST IN TWO WEEKS, PLEASE GIVE [place] A CALL AT [phone].   THE PATIENT IS ENCOURAGED TO PRACTICE SOCIAL DISTANCING DUE TO THE COVID-19 PANDEMIC.

## 2023-10-17 NOTE — Assessment & Plan Note (Signed)
Chronic, she is encouraged to keep BP well controlled and to stay well hydrated to decrease risk of CKD progression.

## 2023-10-17 NOTE — Assessment & Plan Note (Signed)
AFTER OBTAINING VERBAL CONSENT, LEFT EAR WAS FLUSHED BY IRRIGATION. SHE TOLERATED PROCEDURE WELL WITHOUT ANY COMPLICATIONS. NO TM ABNORMALITIES WERE NOTED.

## 2023-10-17 NOTE — Assessment & Plan Note (Signed)
Chronic, sx are improved with Linzess 72mg  daily prn. She has never filled rx. She was given more samples and rx sent to the pharmacy.

## 2023-11-14 ENCOUNTER — Ambulatory Visit: Payer: Medicare Other | Admitting: Podiatry

## 2023-11-15 ENCOUNTER — Ambulatory Visit (INDEPENDENT_AMBULATORY_CARE_PROVIDER_SITE_OTHER): Payer: Medicare Other | Admitting: Podiatry

## 2023-11-15 ENCOUNTER — Encounter: Payer: Self-pay | Admitting: Podiatry

## 2023-11-15 DIAGNOSIS — M79675 Pain in left toe(s): Secondary | ICD-10-CM

## 2023-11-15 DIAGNOSIS — B351 Tinea unguium: Secondary | ICD-10-CM | POA: Diagnosis not present

## 2023-11-15 DIAGNOSIS — E1142 Type 2 diabetes mellitus with diabetic polyneuropathy: Secondary | ICD-10-CM

## 2023-11-15 DIAGNOSIS — M79674 Pain in right toe(s): Secondary | ICD-10-CM | POA: Diagnosis not present

## 2023-11-20 NOTE — Progress Notes (Signed)
  Subjective:  Patient ID: Molly Meyer, female    DOB: May 30, 1924,  MRN: [id number]  89 y.o. female presents at risk foot care with history of diabetic neuropathy and painful elongated mycotic toenails 1-5 bilaterally which are tender when wearing enclosed shoe gear. Pain is relieved with periodic professional debridement.  Chief Complaint  Patient presents with   Nail Problem    DFC.   New problem(s): None   PCP is [name], MD , and last visit was October 17, 2023.  Allergies  Allergen Reactions   Penicillins Rash    Thinks she may be able to take some forms now.Has patient had a PCN reaction causing immediate rash, facial/tongue/throat swelling, SOB or lightheadedness with hypotension: No Has patient had a PCN reaction causing severe rash involving mucus membranes or skin necrosis: No Has patient had a PCN reaction that required hospitalization No Has patient had a PCN reaction occurring within the last 10 years: No If all of the above answers are "NO", then may proceed with Cephalosporin use.    Review of Systems: Negative except as noted in the HPI.   Objective:  Molly Meyer is a pleasant 89 y.o. female in NAD. AAO x 3.  Vascular Examination: Capillary refill time immediate b/l. Vascular status intact b/l with palpable pedal pulses. Pedal hair absent b/l. No edema. No pain with calf compression b/l. Skin temperature gradient WNL b/l. No cyanosis or clubbing noted b/l LE.  Neurological Examination: Vibratory sensation intact b/l. Protective sensation diminished with 10g monofilament b/l.  Dermatological Examination: Pedal skin with normal turgor, texture and tone b/l.  No open wounds. No interdigital macerations.   Toenails 2-5 b/l thick, discolored, elongated with subungual debris and pain on dorsal palpation.   Incurvated nailplate both borders of left hallux and both borders of right hallux.  Nail border hypertrophy absent. There is tenderness to  palpation. Sign(s) of infection: no clinical signs of infection noted on examination today.  Musculoskeletal Examination: Muscle strength 5/5 to all lower extremity muscle groups bilaterally. HAV with bunion deformity noted b/l LE. Hammertoe deformity noted 1-5 b/l. Pes planus deformity noted bilateral LE. Utilizes rollator for ambulation assistance.  Radiographs: None  Last A1c:      Latest Ref Rng & Units 08/18/2023   11:57 AM 04/18/2023   12:05 PM 12/15/2022    3:27 PM  Hemoglobin A1C  Hemoglobin-A1c 4.8 - 5.6 % 6.3  6.4  6.4    Assessment:   1. Pain due to onychomycosis of toenails of both feet   2. Diabetic peripheral neuropathy associated with type 2 diabetes mellitus (HCC)    Plan:  -Consent given for treatment as described below: -Examined patient. -Patient to continue soft, supportive shoe gear daily. -Patient/POA to call should there be question/concern in the interim.  Return in about 3 months (around 02/15/2024).  Freddie Breech, DPM      [place] LOCATION: [address], [place] [address]                   Office [phone]   [facility] LOCATION: [address] [place], [place] [address] Office [phone]

## 2023-11-27 IMAGING — DX DG CHEST 2V
2 series · 2 of 2 positions shown · non-contrast
Comparison: Chest x-ray 12/23/2013.

CLINICAL DATA: Suspected sepsis.

EXAM:
CHEST - 2 VIEW

[chest pa]
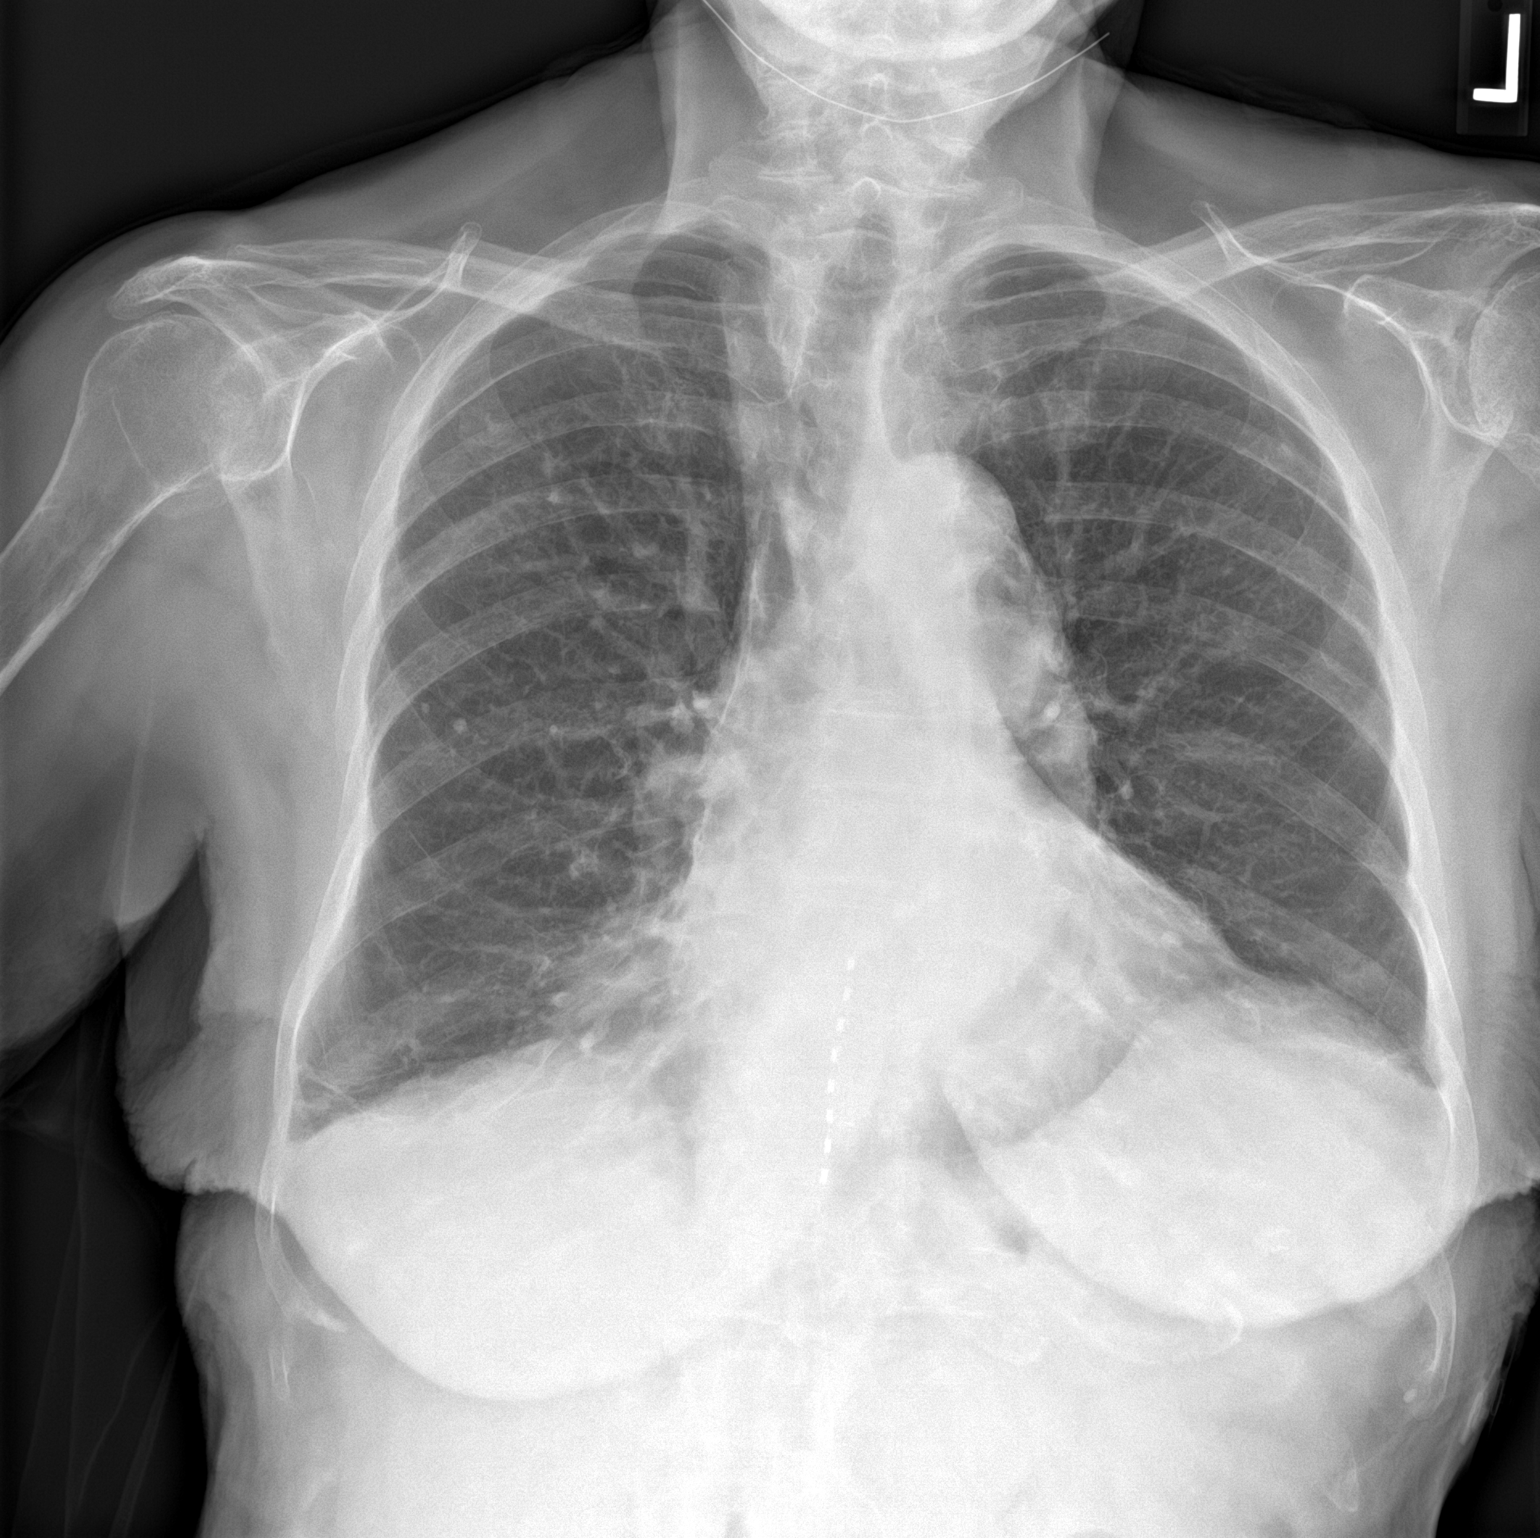

[chest lat]
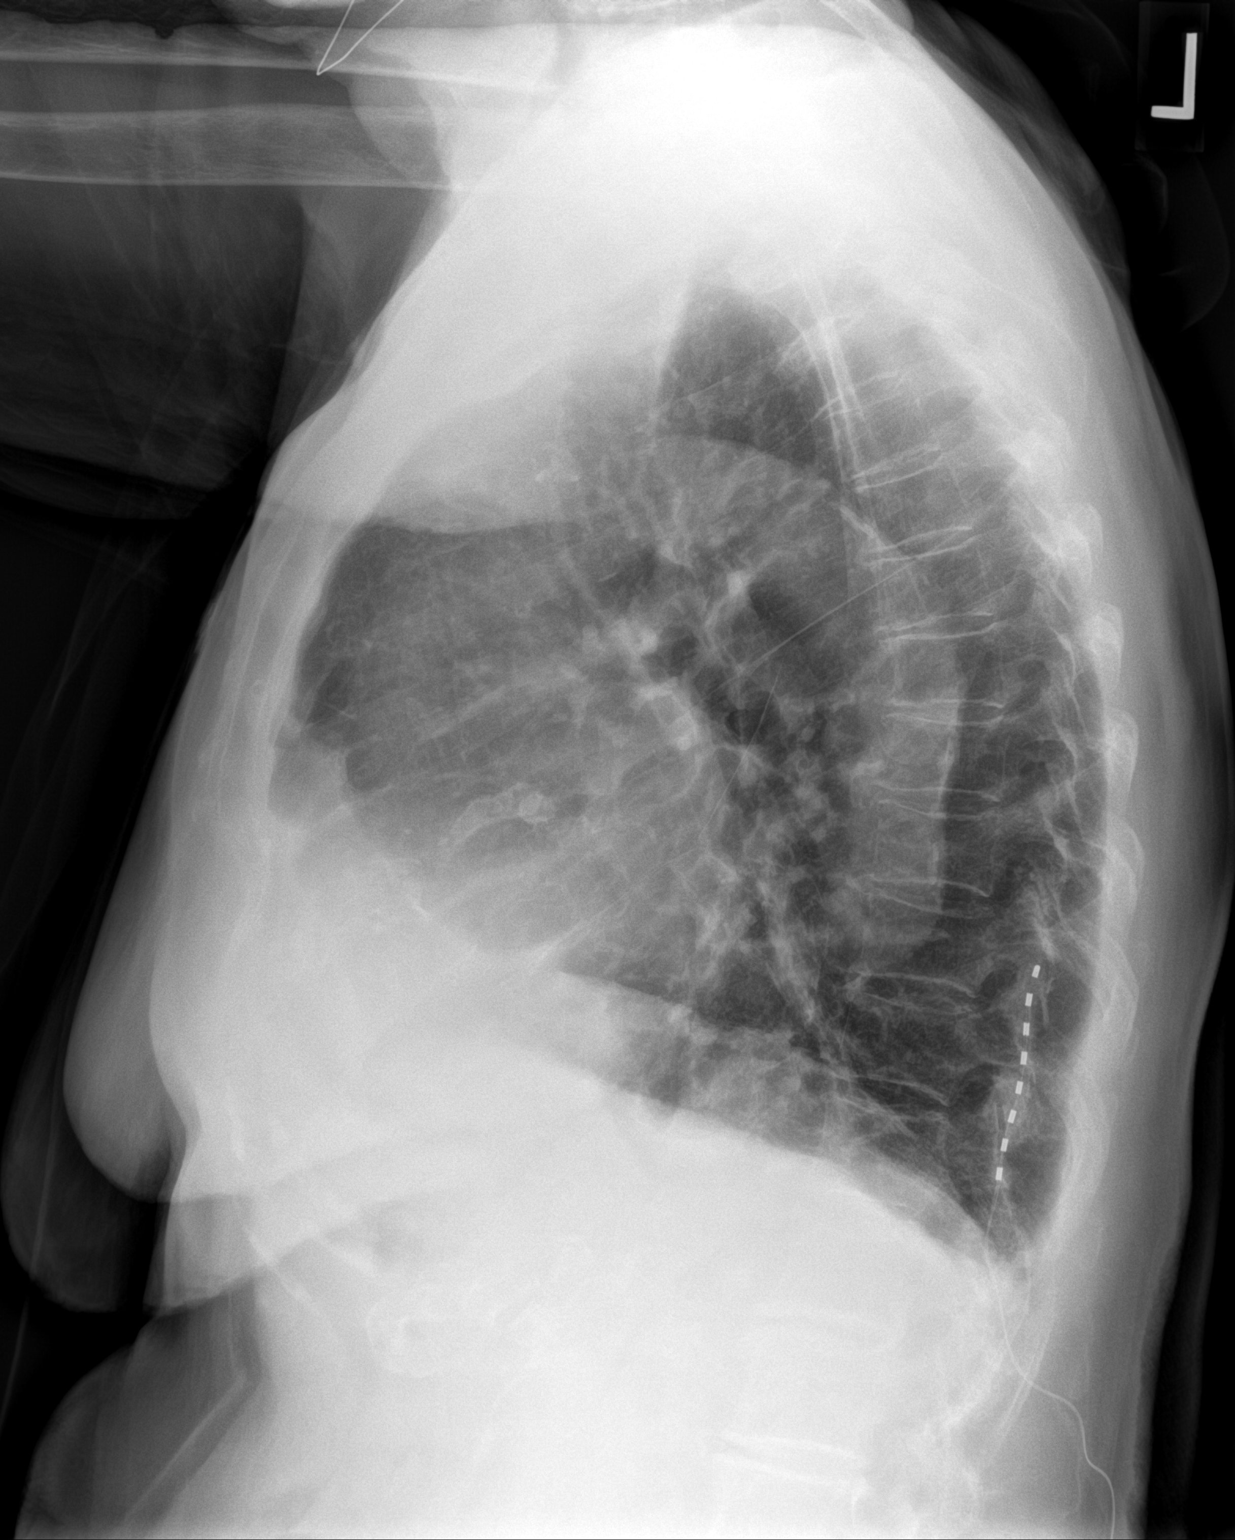

[2 of 2 positions shown; findings below may reference images not displayed]

FINDINGS: The aorta is tortuous. Cardiac silhouette is within normal limits.
There is some strandy opacities in the bilateral lung bases, right
greater than left. Findings are similar to the prior study. There
are small bilateral pleural effusions. There is no evidence for
pneumothorax. Calcified nodules in the right lung are stable. No
acute fractures are seen. Thoracic spinal cord stimulator device is
again noted.
IMPRESSION: 1. Strandy bibasilar opacities may represent scarring or mild
infection/inflammation. There is no focal lung infiltrate.

## 2023-11-28 ENCOUNTER — Other Ambulatory Visit: Payer: Self-pay | Admitting: Cardiology

## 2023-11-28 DIAGNOSIS — I4891 Unspecified atrial fibrillation: Secondary | ICD-10-CM

## 2023-11-28 NOTE — Telephone Encounter (Signed)
Prescription refill request for Eliquis received. Indication:AFIB Last office visit:9/24 Scr:0.89  7/24 Age: 89 Weight:67.1  kg  Prescription refilled

## 2023-12-01 ENCOUNTER — Ambulatory Visit: Payer: Medicare Other | Admitting: Dermatology

## 2023-12-01 ENCOUNTER — Encounter: Payer: Self-pay | Admitting: Dermatology

## 2023-12-01 VITALS — BP 119/75 | HR 81

## 2023-12-01 DIAGNOSIS — D492 Neoplasm of unspecified behavior of bone, soft tissue, and skin: Secondary | ICD-10-CM

## 2023-12-01 DIAGNOSIS — L821 Other seborrheic keratosis: Secondary | ICD-10-CM | POA: Diagnosis not present

## 2023-12-01 DIAGNOSIS — L570 Actinic keratosis: Secondary | ICD-10-CM | POA: Diagnosis not present

## 2023-12-01 DIAGNOSIS — C44311 Basal cell carcinoma of skin of nose: Secondary | ICD-10-CM | POA: Diagnosis not present

## 2023-12-01 DIAGNOSIS — D485 Neoplasm of uncertain behavior of skin: Secondary | ICD-10-CM

## 2023-12-01 NOTE — Patient Instructions (Addendum)
Dear [name],  Thank you for visiting our clinic today. We appreciate your proactive approach in addressing your health concerns.  Here is a summary of the key instructions and next steps from today's appointment with Dr. Langston Reusing:  - Biopsy Procedures: Shave biopsies were performed on the spots located on your nose and scalp to test for skin cancer.   - Nose: The area is suspected to be basal cell carcinoma.   - Scalp: The spot is suspected to be either a dysplastic nevus or melanoma.  - Post-Procedure Care:   - Application: Apply ointment as instructed.   - Protection: Cover the biopsy sites with Band-Aids.  - Lab Results:   - Notification: We will inform you of the biopsy results either by phone or through a message on MyChart.  - Follow-Up:   - Next Steps: Depending on the results, a follow-up appointment may be necessary to discuss further treatment options, which could include surgical intervention.  Please ensure to monitor the biopsy sites for any signs of infection or unusual changes. Do not hesitate to contact Molly Meyer if you have any concerns or questions.  Wishing you a peaceful recovery and a joyful holiday season with your family.  Warm regards,  Dr. Langston Reusing Dermatology    Patient Handout: Wound Care for Skin Biopsy Site  Taking Care of Your Skin Biopsy Site  Proper care of the biopsy site is essential for promoting healing and minimizing scarring. This handout provides instructions on how to care for your biopsy site to ensure optimal recovery.  1. Cleaning the Wound:  Clean the biopsy site daily with gentle soap and water. Gently pat the area dry with a clean, soft towel. Avoid harsh scrubbing or rubbing the area, as this can irritate the skin and delay healing.  2. Applying Aquaphor and Bandage:  After cleaning the wound, apply a thin layer of Aquaphor ointment to the biopsy site. Cover the area with a sterile bandage to protect it from  dirt, bacteria, and friction. Change the bandage daily or as needed if it becomes soiled or wet.  3. Continued Care for One Week:  Repeat the cleaning, Aquaphor application, and bandaging process daily for one week following the biopsy procedure. Keeping the wound clean and moist during this initial healing period will help prevent infection and promote optimal healing.  4. Massaging Aquaphor into the Area:  ---After one week, discontinue the use of bandages but continue to apply Aquaphor to the biopsy site. ----Gently massage the Aquaphor into the area using circular motions. ---Massaging the skin helps to promote circulation and prevent the formation of scar tissue.   Additional Tips:  Avoid exposing the biopsy site to direct sunlight during the healing process, as this can cause hyperpigmentation or worsen scarring. If you experience any signs of infection, such as increased redness, swelling, warmth, or drainage from the wound, contact your healthcare provider immediately. Follow any additional instructions provided by your healthcare provider for caring for the biopsy site and managing any discomfort. Conclusion:  Taking proper care of your skin biopsy site is crucial for ensuring optimal healing and minimizing scarring. By following these instructions for cleaning, applying Aquaphor, and massaging the area, you can promote a smooth and successful recovery. If you have any questions or concerns about caring for your biopsy site, don't hesitate to contact your healthcare provider for guidance.    Important Information   Due to recent changes in healthcare laws, you may see results of  your pathology and/or laboratory studies on MyChart before the doctors have had a chance to review them. We understand that in some cases there may be results that are confusing or concerning to you. Please understand that not all results are received at the same time and often the doctors may need to  interpret multiple results in order to provide you with the best plan of care or course of treatment. Therefore, we ask that you please give Molly Meyer 2 business days to thoroughly review all your results before contacting the office for clarification. Should we see a critical lab result, you will be contacted sooner.     If You Need Anything After Your Visit   If you have any questions or concerns for your doctor, please call our main line at [phone]. If no one answers, please leave a voicemail as directed and we will return your call as soon as possible. Messages left after 4 pm will be answered the following business day.    You may also send Molly Meyer a message via MyChart. We typically respond to MyChart messages within 1-2 business days.  For prescription refills, please ask your pharmacy to contact our office. Our fax number is [phone].  If you have an urgent issue when the clinic is closed that cannot wait until the next business day, you can page your doctor at the number below.     Please note that while we do our best to be available for urgent issues outside of office hours, we are not available 24/7.    If you have an urgent issue and are unable to reach Molly Meyer, you may choose to seek medical care at your doctor's office, retail clinic, urgent care center, or emergency room.   If you have a medical emergency, please immediately call 911 or go to the emergency department. In the event of inclement weather, please call our main line at [phone] for an update on the status of any delays or closures.  Dermatology Medication Tips: Please keep the boxes that topical medications come in in order to help keep track of the instructions about where and how to use these. Pharmacies typically print the medication instructions only on the boxes and not directly on the medication tubes.   If your medication is too expensive, please contact our office at [phone] or send Molly Meyer a message through  MyChart.    We are unable to tell what your co-pay for medications will be in advance as this is different depending on your insurance coverage. However, we may be able to find a substitute medication at lower cost or fill out paperwork to get insurance to cover a needed medication.    If a prior authorization is required to get your medication covered by your insurance company, please allow Molly Meyer 1-2 business days to complete this process.   Drug prices often vary depending on where the prescription is filled and some pharmacies may offer cheaper prices.   The website www.goodrx.com contains coupons for medications through different pharmacies. The prices here do not account for what the cost may be with help from insurance (it may be cheaper with your insurance), but the website can give you the price if you did not use any insurance.  - You can print the associated coupon and take it with your prescription to the pharmacy.  - You may also stop by our office during regular business hours and pick up a GoodRx coupon card.  - If you need your  prescription sent electronically to a different pharmacy, notify our office through [facility] or by phone at [phone]

## 2023-12-01 NOTE — Progress Notes (Signed)
New Patient Visit   Subjective  Molly Meyer is a 89 y.o. female who presents for the following: Skin Lesion  Patient states she has skin lesion located at the nose that she would like to have examined. Patient reports the areas have been there for 2 years. She reports the areas are not bothersome. Patient rates irritation 0 out of 10. She states that the areas have not spread. Patient reports she has not previously been treated for these areas. Patient denies Hx of bx. Patient denies family history of skin cancer(s).  The patient has spots, moles and lesions to be evaluated, some may be new or changing and the patient may have concern these could be cancer.  The following portions of the chart were reviewed this encounter and updated as appropriate: medications, allergies, medical history  Review of Systems:  No other skin or systemic complaints except as noted in HPI or Assessment and Plan.  Objective  Well appearing patient in no apparent distress; mood and affect are within normal limits.  A focused examination was performed of the following areas: Nose  Relevant exam findings are noted in the Assessment and Plan.  Right Nasal Sidewall 5mm pearly papule  Mid Occipital Scalp 7mm brown papule  Left Nasal Sidewall Pink keratotic papule                    Assessment & Plan   Suspicious Skin Lesions - Assessment: Patient presents with three suspicious skin lesions: two on the nose and one on the scalp. The lesion on the right dorsal nose is a 5-millimeter papule, suspected to be basal cell carcinoma or precancerous. The lesion on the posterior scalp is an 8-millimeter irregular brown papule, suspected to be dysplastic nevus or melanoma. Patient reports a history of significant sun exposure from golfing. - Plan:   - Perform shave biopsy of the 5-millimeter papule on the right dorsal nose to rule out basal cell carcinoma.   - Perform shave biopsy of the 8-millimeter  irregular brown papule on the posterior scalp to rule out dysplastic nevus versus melanoma.   - Provide patient with instructions for post-biopsy care, including application of ointment and use of Band-Aids.   - Communicate biopsy results to patient via phone call or MyChart message.   - If lesions are confirmed as skin cancers, refer patient to surgeon for excision and suturing.   PROCEDURE NOTEs Neoplasm of uncertain behavior of skin (2) Right Nasal Sidewall  Skin / nail biopsy Type of biopsy: tangential   Informed consent: discussed and consent obtained   Timeout: patient name, date of birth, surgical site, and procedure verified   Procedure prep:  Patient was prepped and draped in usual sterile fashion Prep type:  Isopropyl alcohol Anesthesia: the lesion was anesthetized in a standard fashion   Anesthetic:  1% lidocaine w/ epinephrine 1-100,000 buffered w/ 8.4% NaHCO3 Instrument used: DermaBlade   Hemostasis achieved with: aluminum chloride   Outcome: patient tolerated procedure well   Post-procedure details: sterile dressing applied and wound care instructions given   Dressing type: petrolatum gauze and bandage    Specimen 1 - Surgical pathology Differential Diagnosis: r/o BCC  Check Margins:Yes  Mid Occipital Scalp  Skin / nail biopsy Type of biopsy: tangential   Informed consent: discussed and consent obtained   Timeout: patient name, date of birth, surgical site, and procedure verified   Procedure prep:  Patient was prepped and draped in usual sterile fashion Prep type:  Isopropyl alcohol Anesthesia: the lesion was anesthetized in a standard fashion   Anesthetic:  1% lidocaine w/ epinephrine 1-100,000 buffered w/ 8.4% NaHCO3 Instrument used: DermaBlade   Hemostasis achieved with: aluminum chloride   Outcome: patient tolerated procedure well   Post-procedure details: sterile dressing applied and wound care instructions given   Dressing type: petrolatum gauze and  bandage    Specimen 2 - Surgical pathology Differential Diagnosis: r./o DN  Check Margins: Yes  AK (actinic keratosis) Left Nasal Sidewall  Destruction of lesion - Left Nasal Sidewall Complexity: simple   Destruction method: cryotherapy   Informed consent: discussed and consent obtained   Timeout:  patient name, date of birth, surgical site, and procedure verified Lesion destroyed using liquid nitrogen: Yes   Region frozen until ice ball extended beyond lesion: Yes   Outcome: patient tolerated procedure well with no complications   Post-procedure details: wound care instructions given      No follow-ups on file.    Documentation: I have reviewed the above documentation for accuracy and completeness, and I agree with the above.  Stasia Cavalier, am acting as scribe for Langston Reusing, DO.  Langston Reusing, DO

## 2023-12-05 LAB — SURGICAL PATHOLOGY

## 2023-12-06 ENCOUNTER — Telehealth: Payer: Self-pay

## 2023-12-06 ENCOUNTER — Other Ambulatory Visit: Payer: Self-pay | Admitting: Internal Medicine

## 2023-12-06 DIAGNOSIS — C4491 Basal cell carcinoma of skin, unspecified: Secondary | ICD-10-CM | POA: Insufficient documentation

## 2023-12-06 HISTORY — DX: Basal cell carcinoma of skin, unspecified: C44.91

## 2023-12-06 NOTE — Telephone Encounter (Signed)
Pt has been informed of results and expressed understanding.  °

## 2023-12-06 NOTE — Progress Notes (Signed)
Hi Shiron,  Please call patient and let her know that the bx on her nose was positive for a skin cancer.  Please help her schedule a follow up for sometime in January so we can discuss treatment options.  (LN2 and topical chemotherapy creams versus Mohs Surgery)  Thanks!  Diagnosis 1. Skin , right nasal sidewall BASAL CELL CARCINOMA, NODULAR PATTERN, DEEP MARGIN INVOLVED 2. Skin , mid occipital scalp PIGMENTED SEBORRHEIC KERATOSIS, PERIPHERAL MARGIN INVOLVED

## 2023-12-06 NOTE — Telephone Encounter (Signed)
-----   Message from Langston Reusing sent at 12/06/2023 11:30 AM EST ----- Hi Shiron,  Please call patient and let her know that the bx on her nose was positive for a skin cancer.  Please help her schedule a follow up for sometime in January so we can discuss treatment options.  (LN2 and topical chemotherapy creams versus Mohs Surgery)  Thanks!  Diagnosis 1. Skin , right nasal sidewall BASAL CELL CARCINOMA, NODULAR PATTERN, DEEP MARGIN INVOLVED 2. Skin , mid occipital scalp PIGMENTED SEBORRHEIC KERATOSIS, PERIPHERAL MARGIN INVOLVED

## 2023-12-08 ENCOUNTER — Other Ambulatory Visit: Payer: Self-pay | Admitting: Internal Medicine

## 2023-12-12 NOTE — Telephone Encounter (Signed)
Pt scheduled for 01/03/24 with JD

## 2023-12-30 ENCOUNTER — Other Ambulatory Visit: Payer: Self-pay

## 2023-12-30 MED ORDER — ROSUVASTATIN CALCIUM 20 MG PO TABS: 20.0000 mg | ORAL_TABLET | Freq: Every day | ORAL | 1 refills | Status: DC

## 2024-01-03 ENCOUNTER — Encounter: Payer: Self-pay | Admitting: Internal Medicine

## 2024-01-03 ENCOUNTER — Ambulatory Visit: Payer: Medicare Other | Admitting: Internal Medicine

## 2024-01-03 VITALS — BP 122/84 | HR 74 | Temp 97.4°F | Ht 68.0 in | Wt 148.2 lb

## 2024-01-03 DIAGNOSIS — E1122 Type 2 diabetes mellitus with diabetic chronic kidney disease: Secondary | ICD-10-CM

## 2024-01-03 DIAGNOSIS — I1310 Hypertensive heart and chronic kidney disease without heart failure, with stage 1 through stage 4 chronic kidney disease, or unspecified chronic kidney disease: Secondary | ICD-10-CM | POA: Diagnosis not present

## 2024-01-03 DIAGNOSIS — N182 Chronic kidney disease, stage 2 (mild): Secondary | ICD-10-CM

## 2024-01-03 DIAGNOSIS — Z9989 Dependence on other enabling machines and devices: Secondary | ICD-10-CM | POA: Diagnosis not present

## 2024-01-03 DIAGNOSIS — D6869 Other thrombophilia: Secondary | ICD-10-CM | POA: Diagnosis not present

## 2024-01-03 DIAGNOSIS — I4821 Permanent atrial fibrillation: Secondary | ICD-10-CM

## 2024-01-03 DIAGNOSIS — K5909 Other constipation: Secondary | ICD-10-CM

## 2024-01-03 NOTE — Progress Notes (Signed)
 I,Molly Meyer, CMA,acting as a neurosurgeon for Molly LOISE Slocumb, MD.,have documented all relevant documentation on the behalf of Molly LOISE Slocumb, MD,as directed by  Molly LOISE Slocumb, MD while in the presence of Molly LOISE Slocumb, MD.  Subjective:  Patient ID: Molly Meyer , female    DOB: August 14, 1924 , 89 y.o.   MRN: [id number]  Chief Complaint  Patient presents with   Diabetes   Hypertension    HPI  She presents today for DM/HTN check. She reports compliance with meds. She is alone today. Denies chest pain and shortness of breath. She has no specific questions or concerns.     Diabetes She presents for her follow-up diabetic visit. She has type 2 diabetes mellitus. Her disease course has been stable. There are no hypoglycemic associated symptoms. Pertinent negatives for hypoglycemia include no dizziness. Pertinent negatives for diabetes include no blurred vision and no chest pain. There are no hypoglycemic complications. Diabetic complications include nephropathy. Risk factors for coronary artery disease include diabetes mellitus, dyslipidemia, hypertension, post-menopausal and sedentary lifestyle. Her weight is stable. Her breakfast blood glucose is taken between 8-9 am. Her breakfast blood glucose range is generally 90-110 mg/dl. An ACE inhibitor/angiotensin II receptor blocker is being taken. She sees a podiatrist.Eye exam is current.  Hypertension This is a chronic problem. The current episode started more than 1 year ago. The problem has been gradually improving since onset. The problem is controlled. Pertinent negatives include no blurred vision, chest pain, palpitations or shortness of breath.     Past Medical History:  Diagnosis Date   BCC (basal cell carcinoma) 12/06/2023   Right nasal sidewall - clinic f/u needed to discuss Tx     Degenerative disc disease, lumbar    Diabetes mellitus without complication (HCC)    Hypertension      Family History  Problem Relation Age of  Onset   Hyperlipidemia Father      Current Outpatient Medications:    acetaminophen  (TYLENOL ) 500 MG tablet, Take 500 mg by mouth every 6 (six) hours as needed for moderate pain., Disp: , Rfl:    Cholecalciferol (VITAMIN D ) 50 MCG (2000 UT) tablet, Take 2,000 Units by mouth daily., Disp: , Rfl:    cyanocobalamin  (,VITAMIN B-12,) 1000 MCG/ML injection, Inject 1,000 mcg into the muscle See admin instructions. Every 6 or 8 week, Disp: , Rfl:    diltiazem  (CARDIZEM  CD) 180 MG 24 hr capsule, TAKE 1 CAPSULE BY MOUTH EVERY DAY, Disp: 90 capsule, Rfl: 3   ELIQUIS  5 MG TABS tablet, TAKE 1 TABLET(5 MG) BY MOUTH TWICE DAILY, Disp: 60 tablet, Rfl: 5   LINZESS  72 MCG capsule, TAKE 1 CAPSULE BY MOUTH DAILY BEFORE BREAKFAST., Disp: 90 capsule, Rfl: 0   lisinopril -hydrochlorothiazide  (ZESTORETIC ) 10-12.5 MG tablet, TAKE 1 TABLET BY MOUTH EVERY DAY, Disp: 90 tablet, Rfl: 2   loratadine  (CLARITIN ) 10 MG tablet, TAKE 1 TABLET BY MOUTH EVERY DAY AS NEEDED FOR ALLERGY, Disp: 90 tablet, Rfl: 1   Propylene Glycol (SYSTANE COMPLETE OP), Place 1 drop into both eyes daily as needed (dry eyes)., Disp: , Rfl:    rosuvastatin  (CRESTOR ) 20 MG tablet, Take 1 tablet (20 mg total) by mouth daily., Disp: 90 tablet, Rfl: 1   simvastatin  (ZOCOR ) 40 MG tablet, Take 40 mg by mouth at bedtime., Disp: , Rfl:    Allergies  Allergen Reactions   Penicillins Rash    Thinks she may be able to take some forms now.Has patient had a PCN reaction  causing immediate rash, facial/tongue/throat swelling, SOB or lightheadedness with hypotension: No Has patient had a PCN reaction causing severe rash involving mucus membranes or skin necrosis: No Has patient had a PCN reaction that required hospitalization No Has patient had a PCN reaction occurring within the last 10 years: No If all of the above answers are NO, then may proceed with Cephalosporin use.     Review of Systems  Constitutional: Negative.   Eyes:  Negative for blurred vision.   Respiratory: Negative.  Negative for shortness of breath.   Cardiovascular: Negative.  Negative for chest pain and palpitations.  Gastrointestinal:  Positive for constipation.  Neurological: Negative.  Negative for dizziness.  Psychiatric/Behavioral: Negative.       Today's Vitals   01/03/24 1145  BP: 122/84  Pulse: 74  Temp: (!) 97.4 F (36.3 C)  SpO2: 98%  Weight: 148 lb 3.2 oz (67.2 kg)  Height: 5' 8 (1.727 m)   Body mass index is 22.53 kg/m.  Wt Readings from Last 3 Encounters:  01/03/24 148 lb 3.2 oz (67.2 kg)  10/17/23 148 lb (67.1 kg)  08/18/23 148 lb 3.2 oz (67.2 kg)     Objective:  Physical Exam Vitals and nursing note reviewed.  Constitutional:      Appearance: Normal appearance.  HENT:     Head: Normocephalic and atraumatic.  Eyes:     Extraocular Movements: Extraocular movements intact.  Cardiovascular:     Rate and Rhythm: Normal rate and regular rhythm.     Heart sounds: Normal heart sounds.  Pulmonary:     Effort: Pulmonary effort is normal.     Breath sounds: Normal breath sounds.  Musculoskeletal:     Comments: Ambulatory with walker  Skin:    General: Skin is warm.  Neurological:     General: No focal deficit present.     Mental Status: She is alert.  Psychiatric:        Mood and Affect: Mood normal.        Behavior: Behavior normal.         Assessment And Plan:  Type 2 diabetes mellitus with stage 2 chronic kidney disease, without long-term current use of insulin (HCC) Assessment & Plan: Chronic, this has been controlled without meds.  I will check labs as below. She has been able to control her diabetes with lifestyle changes. She will f/u in four months for re-evaluation.   Orders: -     CBC -     CMP14+EGFR -     Lipid panel -     Hemoglobin A1c  Hypertensive heart and renal disease with renal failure, stage 1 through stage 4 or unspecified chronic kidney disease, without heart failure Assessment & Plan: Chronic, well  controlled.  She will continue with lisinopril /hct 10/12.5mg  daily. Advised to follow a low sodium diet.   Orders: -     CBC -     CMP14+EGFR -     Lipid panel -     Hemoglobin A1c  Permanent atrial fibrillation (HCC) Assessment & Plan: Chronic, currently on diltiazem  CD and Eliquis . Cardiology input appreciated. She is encouraged to stay well hydrated and to decrease caffeine intake.     Acquired thrombophilia (HCC) Assessment & Plan: Chronic, currently on Eliquis  5mg  twice daily as per Cardiology due to underlying atrial fibrillation. Should consider dose decrease if her creatinine increases.    Chronic constipation Assessment & Plan: Chronic, sx are improved with Linzess  72mg  daily prn. She was given more samples  and rx sent to the pharmacy.       Return for 4 month bp & dm.  Patient was given opportunity to ask questions. Patient verbalized understanding of the plan and was able to repeat key elements of the plan. All questions were answered to their satisfaction.    I, Molly LOISE Slocumb, MD, have reviewed all documentation for this visit. The documentation on 01/03/24 for the exam, diagnosis, procedures, and orders are all accurate and complete.   IF YOU HAVE BEEN REFERRED TO A SPECIALIST, IT MAY TAKE 1-2 WEEKS TO SCHEDULE/PROCESS THE REFERRAL. IF YOU HAVE NOT HEARD FROM US /SPECIALIST IN TWO WEEKS, PLEASE GIVE US  A CALL AT [phone].   THE PATIENT IS ENCOURAGED TO PRACTICE SOCIAL DISTANCING DUE TO THE COVID-19 PANDEMIC.

## 2024-01-03 NOTE — Patient Instructions (Signed)

## 2024-01-04 LAB — CBC
Hematocrit: 40.7 % (ref 34.0–46.6)
Hemoglobin: 14 g/dL (ref 11.1–15.9)
MCH: 31.5 pg (ref 26.6–33.0)
MCHC: 34.4 g/dL (ref 31.5–35.7)
MCV: 92 fL (ref 79–97)
Platelets: 150 10*3/uL (ref 150–450)
RBC: 4.45 x10E6/uL (ref 3.77–5.28)
RDW: 13 % (ref 11.7–15.4)
WBC: 4.3 10*3/uL (ref 3.4–10.8)

## 2024-01-04 LAB — CMP14+EGFR
ALT: 12 [IU]/L (ref 0–32)
AST: 13 [IU]/L (ref 0–40)
Albumin: 4 g/dL (ref 3.6–4.6)
Alkaline Phosphatase: 47 [IU]/L (ref 44–121)
BUN/Creatinine Ratio: 17 (ref 12–28)
BUN: 14 mg/dL (ref 10–36)
Bilirubin Total: 0.7 mg/dL (ref 0.0–1.2)
CO2: 22 mmol/L (ref 20–29)
Calcium: 9.9 mg/dL (ref 8.7–10.3)
Chloride: 106 mmol/L (ref 96–106)
Creatinine, Ser: 0.81 mg/dL (ref 0.57–1.00)
Globulin, Total: 2.1 g/dL (ref 1.5–4.5)
Glucose: 135 mg/dL — ABNORMAL HIGH (ref 70–99)
Potassium: 3.9 mmol/L (ref 3.5–5.2)
Sodium: 142 mmol/L (ref 134–144)
Total Protein: 6.1 g/dL (ref 6.0–8.5)
eGFR: 66 mL/min/{1.73_m2} (ref >59)

## 2024-01-04 LAB — LIPID PANEL
Chol/HDL Ratio: 2.3 {ratio} (ref 0.0–4.4)
Cholesterol, Total: 134 mg/dL (ref 100–199)
HDL: 59 mg/dL (ref >39)
LDL Chol Calc (NIH): 61 mg/dL (ref 0–99)
Triglycerides: 68 mg/dL (ref 0–149)
VLDL Cholesterol Cal: 14 mg/dL (ref 5–40)

## 2024-01-04 LAB — HEMOGLOBIN A1C
Est. average glucose Bld gHb Est-mCnc: 131 mg/dL
Hgb A1c MFr Bld: 6.2 % — ABNORMAL HIGH (ref 4.8–5.6)

## 2024-01-06 NOTE — Assessment & Plan Note (Signed)
 Chronic, sx are improved with Linzess 72mg  daily prn. She was given more samples and rx sent to the pharmacy.

## 2024-01-06 NOTE — Assessment & Plan Note (Addendum)
 Chronic, currently on diltiazem CD and Eliquis. Cardiology input appreciated. She is encouraged to stay well hydrated and to decrease caffeine intake.

## 2024-01-06 NOTE — Assessment & Plan Note (Signed)
Chronic, well controlled.  She will continue with lisinopril/hct 10/12.5mg  daily. Advised to follow a low sodium diet.

## 2024-01-06 NOTE — Assessment & Plan Note (Signed)
Chronic, currently on Eliquis 5mg  twice daily as per Cardiology due to underlying atrial fibrillation. Should consider dose decrease if her creatinine increases.

## 2024-01-06 NOTE — Assessment & Plan Note (Signed)
 Chronic, this has been controlled without meds.  I will check labs as below. She has been able to control her diabetes with lifestyle changes. She will f/u in four months for re-evaluation.

## 2024-01-13 ENCOUNTER — Ambulatory Visit (INDEPENDENT_AMBULATORY_CARE_PROVIDER_SITE_OTHER): Payer: Medicare Other

## 2024-01-13 DIAGNOSIS — Z0000 Encounter for general adult medical examination without abnormal findings: Secondary | ICD-10-CM | POA: Diagnosis not present

## 2024-01-13 NOTE — Patient Instructions (Signed)

## 2024-01-13 NOTE — Progress Notes (Signed)
Subjective:   Molly Meyer is a 89 y.o. female who presents for Medicare Annual (Subsequent) preventive examination.  Visit Complete: Virtual I connected with  Molly Meyer on 01/13/24 by a audio enabled telemedicine application and verified that I am speaking with the correct person using two identifiers.  Patient Location: Home  Provider Location: Home Office  I discussed the limitations of evaluation and management by telemedicine. The patient expressed understanding and agreed to proceed.  Vital Signs: Because this visit was a virtual/telehealth visit, some criteria may be missing or patient reported. Any vitals not documented were not able to be obtained and vitals that have been documented are patient reported.  Patient Medicare AWV questionnaire was completed by the patient on 01/13/2024; I have confirmed that all information answered by patient is correct and no changes since this date.  Cardiac Risk Factors include: hypertension;diabetes mellitus     Objective:    There were no vitals filed for this visit. There is no height or weight on file to calculate BMI.     12/23/2022   11:42 AM 12/31/2021    5:55 PM 12/10/2021    2:02 PM 12/03/2020    3:32 PM 11/14/2019    2:11 PM 06/05/2019   11:09 AM 09/28/2018   11:14 AM  Advanced Directives  Does Patient Have a Medical Advance Directive? Yes Yes Yes Yes Yes Yes Yes  Type of Estate agent of [place];Living will Healthcare Power of [place];Living will Healthcare Power of [place];Living will Healthcare Power of [place];Living will Healthcare Power of [place];Living will Healthcare Power of [place];Living will Healthcare Power of [place];Living will  Does patient want to make changes to medical advance directive?      No - Patient declined No - Patient declined  Copy of Healthcare Power of Attorney in Chart? Yes - validated most recent copy scanned in chart (See row information) Yes - validated  most recent copy scanned in chart (See row information) Yes - validated most recent copy scanned in chart (See row information) Yes - validated most recent copy scanned in chart (See row information) No - copy requested  No - copy requested    Current Medications (verified) Outpatient Encounter Medications as of 01/13/2024  Medication Sig   acetaminophen (TYLENOL) 500 MG tablet Take 500 mg by mouth every 6 (six) hours as needed for moderate pain.   Cholecalciferol (VITAMIN D) 50 MCG (2000 UT) tablet Take 2,000 Units by mouth daily.   cyanocobalamin (,VITAMIN B-12,) 1000 MCG/ML injection Inject 1,000 mcg into the muscle See admin instructions. Every 6 or 8 week   diltiazem (CARDIZEM CD) 180 MG 24 hr capsule TAKE 1 CAPSULE BY MOUTH EVERY DAY   ELIQUIS 5 MG TABS tablet TAKE 1 TABLET(5 MG) BY MOUTH TWICE DAILY   LINZESS 72 MCG capsule TAKE 1 CAPSULE BY MOUTH DAILY BEFORE BREAKFAST.   lisinopril-hydrochlorothiazide (ZESTORETIC) 10-12.5 MG tablet TAKE 1 TABLET BY MOUTH EVERY DAY   loratadine (CLARITIN) 10 MG tablet TAKE 1 TABLET BY MOUTH EVERY DAY AS NEEDED FOR ALLERGY   Propylene Glycol (SYSTANE COMPLETE OP) Place 1 drop into both eyes daily as needed (dry eyes).   rosuvastatin (CRESTOR) 20 MG tablet Take 1 tablet (20 mg total) by mouth daily.   simvastatin (ZOCOR) 40 MG tablet Take 40 mg by mouth at bedtime.   No facility-administered encounter medications on file as of 01/13/2024.    Allergies (verified) Penicillins   History: Past Medical History:  Diagnosis Date  BCC (basal cell carcinoma) 12/06/2023   Right nasal sidewall - clinic f/u needed to discuss Tx     Degenerative disc disease, lumbar    Diabetes mellitus without complication (HCC)    Hypertension    Past Surgical History:  Procedure Laterality Date   KNEE SURGERY     Family History  Problem Relation Age of Onset   Hyperlipidemia Father    Social History   Socioeconomic History   Marital status: Widowed    Spouse  name: Not on file   Number of children: Not on file   Years of education: Not on file   Highest education level: Not on file  Occupational History   Occupation: retired  Tobacco Use   Smoking status: Never    Passive exposure: Never   Smokeless tobacco: Never  Vaping Use   Vaping status: Never Used  Substance and Sexual Activity   Alcohol use: No    Alcohol/week: 0.0 standard drinks of alcohol   Drug use: Never   Sexual activity: Not Currently  Other Topics Concern   Not on file  Social History Narrative   Not on file   Social Drivers of Health   Financial Resource Strain: Low Risk  (01/13/2024)   Overall Financial Resource Strain (CARDIA)    Difficulty of Paying Living Expenses: Not very hard  Food Insecurity: No Food Insecurity (01/13/2024)   Hunger Vital Sign    Worried About Running Out of Food in the Last Year: Never true    Ran Out of Food in the Last Year: Never true  Transportation Needs: No Transportation Needs (01/13/2024)   PRAPARE - Administrator, Civil Service (Medical): No    Lack of Transportation (Non-Medical): No  Physical Activity: Inactive (01/13/2024)   Exercise Vital Sign    Days of Exercise per Week: 0 days    Minutes of Exercise per Session: 0 min  Stress: No Stress Concern Present (01/13/2024)   [employer] of Occupational Health - Occupational Stress Questionnaire    Feeling of Stress : Not at all  Social Connections: Moderately Isolated (01/13/2024)   Social Connection and Isolation Panel [NHANES]    Frequency of Communication with Friends and Family: Twice a week    Frequency of Social Gatherings with Friends and Family: Three times a week    Attends Religious Services: More than 4 times per year    Active Member of Clubs or Organizations: No    Attends Banker Meetings: Never    Marital Status: Never married    Tobacco Counseling Counseling given: Not Answered   Clinical Intake:  Pre-visit preparation  completed: Yes  Pain : No/denies pain     Nutritional Risks: None Diabetes: Yes CBG done?: No Did pt. bring in CBG monitor from home?: No  How often do you need to have someone help you when you read instructions, pamphlets, or other written materials from your doctor or pharmacy?: 1 - Never What is the last grade level you completed in school?: unable to answer.  Interpreter Needed?: No      Activities of Daily Living    01/13/2024    9:16 AM  In your present state of health, do you have any difficulty performing the following activities:  Hearing? 1  Vision? 0  Difficulty concentrating or making decisions? 0  Walking or climbing stairs? 1  Dressing or bathing? 0  Doing errands, shopping? 0  Preparing Food and eating ? N  Using the Toilet?  N  In the past six months, have you accidently leaked urine? N  Do you have problems with loss of bowel control? N  Managing your Medications? N  Managing your Finances? N  Housekeeping or managing your Housekeeping? N    Patient Care Team: [name], MD as PCP - General (Internal Medicine) Jens Som Madolyn Frieze, MD as PCP - Cardiology (Cardiology) Sheard, Joline Maxcy, DPM (Inactive) as Consulting Physician (Podiatry)  Indicate any recent Medical Services you may have received from other than [facility] providers in the past year (date may be approximate).     Assessment:   This is a routine wellness examination for [place].  Hearing/Vision screen No results found.   Goals Addressed               This Visit's Progress     Staying Healthy (pt-stated)        She wants to stay active & moving.       Depression Screen    01/13/2024    9:23 AM 01/03/2024   11:46 AM 10/17/2023    9:27 AM 08/18/2023   11:11 AM 04/18/2023   11:42 AM 12/23/2022   11:43 AM 12/15/2022    2:52 PM  PHQ 2/9 Scores  PHQ - 2 Score 0 0 0 0 0 0 0  PHQ- 9 Score 0 0 0 0 0      Fall Risk    01/13/2024    9:22 AM 01/03/2024   11:46 AM 10/17/2023     9:28 AM 10/17/2023    9:27 AM 08/18/2023   11:10 AM  Fall Risk   Falls in the past year? 0 0 0 0 0  Number falls in past yr: 0 0  0 0  Injury with Fall? 0 0  0 0  Risk for fall due to : No Fall Risks No Fall Risks  No Fall Risks No Fall Risks  Follow up Falls evaluation completed Falls evaluation completed  Falls evaluation completed Falls evaluation completed    MEDICARE RISK AT HOME:    TIMED UP AND GO:  Was the test performed?  No    Cognitive Function:        01/13/2024    9:23 AM 12/23/2022   11:46 AM 12/10/2021    2:06 PM 12/03/2020    3:35 PM 11/14/2019    2:15 PM  6CIT Screen  What Year? 0 points 0 points 0 points 0 points 0 points  What month? 0 points 0 points 0 points 0 points 0 points  What time? 0 points 0 points 0 points 0 points 0 points  Count back from 20 0 points 0 points 0 points 0 points 0 points  Months in reverse 0 points 0 points 0 points 0 points 0 points  Repeat phrase 0 points 4 points 4 points 0 points 0 points  Total Score 0 points 4 points 4 points 0 points 0 points    Immunizations Immunization History  Administered Date(s) Administered   Fluad Quad(high Dose 65+) 10/04/2021, 09/07/2022   Fluad Trivalent(High Dose 65+) 10/17/2023   Influenza, High Dose Seasonal PF 10/01/2015, 10/09/2016, 10/07/2017, 09/28/2018, 10/03/2019, 10/20/2020   PFIZER(Purple Top)SARS-COV-2 Vaccination 01/15/2020, 02/05/2020, 10/20/2020   PNEUMOCOCCAL CONJUGATE-20 06/10/2022   Pneumococcal Polysaccharide-23 07/03/2019   Tdap 11/15/2021    TDAP status: Up to date  Flu Vaccine status: Up to date  Pneumococcal vaccine status: Up to date  Covid-19 vaccine status: Completed vaccines  Qualifies for Shingles Vaccine? Yes   Zostavax  completed No   Shingrix Completed?: No.    Education has been provided regarding the importance of this vaccine. Patient has been advised to call insurance company to determine out of pocket expense if they have not yet received this  vaccine. Advised may also receive vaccine at local pharmacy or Health Dept. Verbalized acceptance and understanding.  Screening Tests Health Maintenance  Topic Date Due   COVID-19 Vaccine (4 - 2024-25 season) 01/19/2024 (Originally 08/28/2023)   Zoster Vaccines- Shingrix (1 of 2) 04/02/2024 (Originally 03/03/1943)   FOOT EXAM  01/27/2024   OPHTHALMOLOGY EXAM  03/31/2024   HEMOGLOBIN A1C  07/02/2024   Medicare Annual Wellness (AWV)  01/12/2025   DTaP/Tdap/Td (2 - Td or Tdap) 11/16/2031   Pneumonia Vaccine 48+ Years old  Completed   INFLUENZA VACCINE  Completed   DEXA SCAN  Completed   HPV VACCINES  Aged Out    Health Maintenance  There are no preventive care reminders to display for this patient.   Lung Cancer Screening: (Low Dose CT Chest recommended if Age 20-80 years, 20 pack-year currently smoking OR have quit w/in 15years.) does not qualify.   Lung Cancer Screening Referral: N/A  Additional Screening:  Dental Screening: Recommended annual dental exams for proper oral hygiene  Diabetic Foot Exam: Diabetic Foot Exam: Completed 01/26/2023  Community Resource Referral / Chronic Care Management: CRR required this visit?  No   CCM required this visit?  No     Plan:     I have personally reviewed and noted the following in the patient's chart:   Medical and social history Use of alcohol, tobacco or illicit drugs  Current medications and supplements including opioid prescriptions. Patient is not currently taking opioid prescriptions. Functional ability and status Nutritional status Physical activity Advanced directives List of other physicians Hospitalizations, surgeries, and ER visits in previous 12 months Vitals Screenings to include cognitive, depression, and falls Referrals and appointments  In addition, I have reviewed and discussed with patient certain preventive protocols, quality metrics, and best practice recommendations. A written personalized care plan for  preventive services as well as general preventive health recommendations were provided to patient.     Coolidge Breeze, CMA   01/13/2024   After Visit Summary: (MyChart) Due to this being a telephonic visit, the after visit summary with patients personalized plan was offered to patient via MyChart   Nurse Notes: Completed.

## 2024-01-23 NOTE — Progress Notes (Signed)
 HPI: Follow-up atrial fibrillation. Abdominal ultrasound June 2020 showed 5.3 x 5.65 cm abdominal aortic aneurysm.  Patient has elected to not pursue any intervention and therefore follow-up studies not performed.  Patient previously admitted with lower extremity cellulitis and noted to be in new onset atrial fibrillation.  She was treated with Cardizem  and apixaban .  Echocardiogram 1/23 showed normal LV function, mild biatrial enlargement, mild mitral regurgitation, mild aortic insufficiency and abdominal aortic aneurysm measuring 63 mm.  Patient also with history of diabetes mellitus, hypertension and hyperlipidemia.  Also note she is in no CODE BLUE.  Since she was last seen, she denies dyspnea, chest pain, palpitations, syncope or bleeding.  Current Outpatient Medications  Medication Sig Dispense Refill   acetaminophen  (TYLENOL ) 500 MG tablet Take 500 mg by mouth every 6 (six) hours as needed for moderate pain.     Cholecalciferol (VITAMIN D ) 50 MCG (2000 UT) tablet Take 2,000 Units by mouth daily.     diltiazem  (CARDIZEM  CD) 180 MG 24 hr capsule TAKE 1 CAPSULE BY MOUTH EVERY DAY 90 capsule 3   ELIQUIS  5 MG TABS tablet TAKE 1 TABLET(5 MG) BY MOUTH TWICE DAILY 60 tablet 5   LINZESS  72 MCG capsule TAKE 1 CAPSULE BY MOUTH DAILY BEFORE BREAKFAST. 90 capsule 0   lisinopril -hydrochlorothiazide  (ZESTORETIC ) 10-12.5 MG tablet TAKE 1 TABLET BY MOUTH EVERY DAY 90 tablet 2   loratadine  (CLARITIN ) 10 MG tablet TAKE 1 TABLET BY MOUTH EVERY DAY AS NEEDED FOR ALLERGY 90 tablet 1   Propylene Glycol (SYSTANE COMPLETE OP) Place 1 drop into both eyes daily as needed (dry eyes).     rosuvastatin  (CRESTOR ) 20 MG tablet Take 1 tablet (20 mg total) by mouth daily. 90 tablet 1   cyanocobalamin  (,VITAMIN B-12,) 1000 MCG/ML injection Inject 1,000 mcg into the muscle See admin instructions. Every 6 or 8 week (Patient not taking: Reported on 02/06/2024)     simvastatin  (ZOCOR ) 40 MG tablet Take 40 mg by mouth at  bedtime. (Patient not taking: Reported on 02/06/2024)     No current facility-administered medications for this visit.     Past Medical History:  Diagnosis Date   BCC (basal cell carcinoma) 12/06/2023   Right nasal sidewall - clinic f/u needed to discuss Tx     Degenerative disc disease, lumbar    Diabetes mellitus without complication (HCC)    Hypertension     Past Surgical History:  Procedure Laterality Date   KNEE SURGERY      Social History   Socioeconomic History   Marital status: Widowed    Spouse name: Not on file   Number of children: Not on file   Years of education: Not on file   Highest education level: Not on file  Occupational History   Occupation: retired  Tobacco Use   Smoking status: Never    Passive exposure: Never   Smokeless tobacco: Never  Vaping Use   Vaping status: Never Used  Substance and Sexual Activity   Alcohol use: No    Alcohol/week: 0.0 standard drinks of alcohol   Drug use: Never   Sexual activity: Not Currently  Other Topics Concern   Not on file  Social History Narrative   Not on file   Social Drivers of Health   Financial Resource Strain: Low Risk  (01/13/2024)   Overall Financial Resource Strain (CARDIA)    Difficulty of Paying Living Expenses: Not very hard  Food Insecurity: No Food Insecurity (01/13/2024)   Hunger Vital  Sign    Worried About Programme researcher, broadcasting/film/video in the Last Year: Never true    Ran Out of Food in the Last Year: Never true  Transportation Needs: No Transportation Needs (01/13/2024)   PRAPARE - Administrator, Civil Service (Medical): No    Lack of Transportation (Non-Medical): No  Physical Activity: Inactive (01/13/2024)   Exercise Vital Sign    Days of Exercise per Week: 0 days    Minutes of Exercise per Session: 0 min  Stress: No Stress Concern Present (01/13/2024)   [employer] of Occupational Health - Occupational Stress Questionnaire    Feeling of Stress : Not at all  Social  Connections: Moderately Isolated (01/13/2024)   Social Connection and Isolation Panel [NHANES]    Frequency of Communication with Friends and Family: Twice a week    Frequency of Social Gatherings with Friends and Family: Three times a week    Attends Religious Services: More than 4 times per year    Active Member of Clubs or Organizations: No    Attends Banker Meetings: Never    Marital Status: Never married  Intimate Partner Violence: Not At Risk (01/13/2024)   Humiliation, Afraid, Rape, and Kick questionnaire    Fear of Current or Ex-Partner: No    Emotionally Abused: No    Physically Abused: No    Sexually Abused: No    Family History  Problem Relation Age of Onset   Hyperlipidemia Father     ROS: no fevers or chills, productive cough, hemoptysis, dysphasia, odynophagia, melena, hematochezia, dysuria, hematuria, rash, seizure activity, orthopnea, PND, pedal edema, claudication. Remaining systems are negative.  Physical Exam: Well-developed well-nourished in no acute distress.  Skin is warm and dry.  HEENT is normal.  Neck is supple.  Chest is clear to auscultation with normal expansion.  Cardiovascular exam is irregular Abdominal exam nontender or distended. No masses palpated. Extremities show no edema. neuro grossly intact   A/P  1 permanent atrial fibrillation-continue Cardizem .  As outlined in previous notes we had a long discussions concerning anticoagulation.  She has a large abdominal aortic aneurysm but is more concerned about the potential for CVA.  Will continue apixaban .  2 abdominal aortic aneurysm-patient is now 89 years old (soon-to-be 70).  She has elected not to pursue any additional imaging which I think is appropriate.  She would never agree to surgical intervention.  3 hypertension-patient's blood pressure is controlled.  Continue present medications.  4 hyperlipidemia-continue statin.  5 no CODE BLUE  Alexandria Angel, MD

## 2024-02-06 ENCOUNTER — Ambulatory Visit: Payer: Medicare Other | Attending: Cardiology | Admitting: Cardiology

## 2024-02-06 ENCOUNTER — Encounter: Payer: Self-pay | Admitting: Cardiology

## 2024-02-06 VITALS — BP 102/70 | HR 105 | Ht 67.0 in | Wt 151.0 lb

## 2024-02-06 DIAGNOSIS — I714 Abdominal aortic aneurysm, without rupture, unspecified: Secondary | ICD-10-CM | POA: Insufficient documentation

## 2024-02-06 DIAGNOSIS — I4821 Permanent atrial fibrillation: Secondary | ICD-10-CM | POA: Insufficient documentation

## 2024-02-06 DIAGNOSIS — I10 Essential (primary) hypertension: Secondary | ICD-10-CM | POA: Diagnosis not present

## 2024-02-06 NOTE — Patient Instructions (Signed)

## 2024-02-20 ENCOUNTER — Ambulatory Visit: Payer: Self-pay | Admitting: Internal Medicine

## 2024-02-20 NOTE — Telephone Encounter (Signed)
  Chief Complaint: L foot/ankle swelling Symptoms: swelling Frequency: about a week ago this started Pertinent Negatives: Patient denies fever, SOB, pain, injury/trauma,  Disposition: [] ED /[] Urgent Care (no appt availability in office) / [x] Appointment(In office/virtual)/ []  [facility] [place] [facility]/ [] Home Care/ [] Refused Recommended Disposition /[] [facility]/ []  Follow-up with PCP Additional Notes: Pt states that this started about a week ago. Denies injury and pain. Denies SOB. States that she is elevating the ankle but it doesn't seem to be helping. Pt sched, advised to call back per Epic care instructions, pt understands.   Copied from CRM [id number]. Topic: Clinical - Red Word Triage >> Feb 20, 2024  8:04 AM Dondra Prader A wrote: Red Word that prompted transfer to Nurse Triage: left foot and ankle swelling. Reason for Disposition . MILD or MODERATE ankle swelling (e.g., can't move joint normally, can't do usual activities) (Exceptions: Itchy, localized swelling; swelling is chronic.)  Answer Assessment - Initial Assessment Questions 1. LOCATION: "Which ankle is swollen?" "Where is the swelling?"     L 2. ONSET: "When did the swelling start?"     About a week ago 3. SWELLING: "How bad is the swelling?" Or, "How large is it?" (e.g., mild, moderate, severe; size of localized swelling)    - NONE: No joint swelling.   - LOCALIZED: Localized; small area of puffy or swollen skin (e.g., insect bite, skin irritation).   - MILD: Joint looks or feels mildly swollen or puffy.   - MODERATE: Swollen; interferes with normal activities (e.g., work or school); decreased range of movement; may be limping.   - SEVERE: Very swollen; can't move swollen joint at all; limping a lot or unable to walk.     Impacting the way my shoe that was specifically made for my shoe fits 4. PAIN: "Is there any pain?" If Yes, ask: "How bad is it?" (Scale 1-10; or mild, moderate, severe)   - NONE (0): no pain.   -  MILD (1-3): doesn't interfere with normal activities.    - MODERATE (4-7): interferes with normal activities (e.g., work or school) or awakens from sleep, limping.    - SEVERE (8-10): excruciating pain, unable to do any normal activities, unable to walk.      denies 5. CAUSE: "What do you think caused the ankle swelling?"     unsure 6. OTHER SYMPTOMS: "Do you have any other symptoms?" (e.g., fever, chest pain, difficulty breathing, calf pain)     denies  Protocols used: Ankle Swelling-A-AH

## 2024-02-22 ENCOUNTER — Encounter: Payer: Self-pay | Admitting: Family Medicine

## 2024-02-22 ENCOUNTER — Ambulatory Visit (INDEPENDENT_AMBULATORY_CARE_PROVIDER_SITE_OTHER): Payer: Self-pay | Admitting: Family Medicine

## 2024-02-22 VITALS — BP 116/70 | HR 74 | Temp 97.7°F | Ht 67.0 in | Wt 150.0 lb

## 2024-02-22 DIAGNOSIS — L819 Disorder of pigmentation, unspecified: Secondary | ICD-10-CM | POA: Diagnosis not present

## 2024-02-22 DIAGNOSIS — R600 Localized edema: Secondary | ICD-10-CM | POA: Diagnosis not present

## 2024-02-22 DIAGNOSIS — I8390 Asymptomatic varicose veins of unspecified lower extremity: Secondary | ICD-10-CM | POA: Insufficient documentation

## 2024-02-22 DIAGNOSIS — D6869 Other thrombophilia: Secondary | ICD-10-CM | POA: Diagnosis not present

## 2024-02-22 NOTE — Progress Notes (Signed)
 I,Jameka J Llittleton, CMA,acting as a Neurosurgeon for [employer], NP.,have documented all relevant documentation on the behalf of Ellender Hose, NP,as directed by  Ellender Hose, NP while in the presence of Ellender Hose, NP.  Subjective:  Patient ID: Molly Meyer , female    DOB: July 04, 1924 , 89 y.o.   MRN: [id number]  Chief Complaint  Patient presents with   Edema    HPI  Patient is a 89 year old female who presents today for swelling in her left ankle and foot. She states that she noticed it last weekend and it  bothered her and she has since been elevating it but today she states the swelling is all gone. Patient reports she noticed some discoloration on the 4th toe of her right foot.She denies any pain to her feet, cannot recall whether she hit her foot. She is on Eliquis 5mg  BID for anticoagulation, patient also has varicose veins on both feet but she denies any pain.  Advised patient that she will be referred to vascular surgery. She voiced understanding.     Past Medical History:  Diagnosis Date   BCC (basal cell carcinoma) 12/06/2023   Right nasal sidewall - clinic f/u needed to discuss Tx     Degenerative disc disease, lumbar    Diabetes mellitus without complication (HCC)    Hypertension      Family History  Problem Relation Age of Onset   Hyperlipidemia Father      Current Outpatient Medications:    acetaminophen (TYLENOL) 500 MG tablet, Take 500 mg by mouth every 6 (six) hours as needed for moderate pain., Disp: , Rfl:    Cholecalciferol (VITAMIN D) 50 MCG (2000 UT) tablet, Take 2,000 Units by mouth daily., Disp: , Rfl:    cyanocobalamin (,VITAMIN B-12,) 1000 MCG/ML injection, Inject 1,000 mcg into the muscle See admin instructions. Every 6 or 8 week, Disp: , Rfl:    diltiazem (CARDIZEM CD) 180 MG 24 hr capsule, TAKE 1 CAPSULE BY MOUTH EVERY DAY, Disp: 90 capsule, Rfl: 3   ELIQUIS 5 MG TABS tablet, TAKE 1 TABLET(5 MG) BY MOUTH TWICE DAILY, Disp: 60 tablet, Rfl: 5   LINZESS  72 MCG capsule, TAKE 1 CAPSULE BY MOUTH DAILY BEFORE BREAKFAST., Disp: 90 capsule, Rfl: 0   lisinopril-hydrochlorothiazide (ZESTORETIC) 10-12.5 MG tablet, TAKE 1 TABLET BY MOUTH EVERY DAY, Disp: 90 tablet, Rfl: 2   loratadine (CLARITIN) 10 MG tablet, TAKE 1 TABLET BY MOUTH EVERY DAY AS NEEDED FOR ALLERGY, Disp: 90 tablet, Rfl: 1   Propylene Glycol (SYSTANE COMPLETE OP), Place 1 drop into both eyes daily as needed (dry eyes)., Disp: , Rfl:    rosuvastatin (CRESTOR) 20 MG tablet, Take 1 tablet (20 mg total) by mouth daily., Disp: 90 tablet, Rfl: 1   simvastatin (ZOCOR) 40 MG tablet, Take 40 mg by mouth at bedtime., Disp: , Rfl:    Allergies  Allergen Reactions   Penicillins Rash    Thinks she may be able to take some forms now.Has patient had a PCN reaction causing immediate rash, facial/tongue/throat swelling, SOB or lightheadedness with hypotension: No Has patient had a PCN reaction causing severe rash involving mucus membranes or skin necrosis: No Has patient had a PCN reaction that required hospitalization No Has patient had a PCN reaction occurring within the last 10 years: No If all of the above answers are "NO", then may proceed with Cephalosporin use.     Review of Systems  Constitutional: Negative.   Eyes: Negative.  Cardiovascular:  Negative for chest pain, palpitations and leg swelling.  Musculoskeletal:  Positive for gait problem.       Uses a walker  Skin:  Positive for color change.  Psychiatric/Behavioral: Negative.       Today's Vitals   02/22/24 1627  BP: 116/70  Pulse: 74  Temp: 97.7 F (36.5 C)  TempSrc: Oral  Weight: 150 lb (68 kg)  Height: 5\' 7"  (1.702 m)  PainSc: 0-No pain   Body mass index is 23.49 kg/m.  Wt Readings from Last 3 Encounters:  02/22/24 150 lb (68 kg)  02/06/24 151 lb (68.5 kg)  01/03/24 148 lb 3.2 oz (67.2 kg)    The ASCVD Risk score (Arnett DK, et al., 2019) failed to calculate for the following reasons:   The 2019 ASCVD risk score  is only valid for ages 52 to 36  Objective:  Physical Exam HENT:     Head: Normocephalic.  Cardiovascular:     Rate and Rhythm: Normal rate. Rhythm irregular.  Pulmonary:     Effort: Pulmonary effort is normal.     Breath sounds: Normal breath sounds.  Neurological:     Mental Status: She is alert.         Assessment And Plan:  Discoloration of skin of toe Assessment & Plan: Noticed today. Denies pain, refused getting xray   Acquired thrombophilia (HCC) Assessment & Plan: On Eliquis 5 mg BID d/t PAF per cardiology   Varicose veins of foot -     Ambulatory referral to Vascular Surgery  Edema of left lower leg Assessment & Plan: Resolved. Advised to also elevate feet above heart level.     Return if symptoms worsen or fail to improve, for keep appt.  Patient was given opportunity to ask questions. Patient verbalized understanding of the plan and was able to repeat key elements of the plan. All questions were answered to their satisfaction.    I, Ellender Hose, NP, have reviewed all documentation for this visit. The documentation on 02/22/2024 for the exam, diagnosis, procedures, and orders are all accurate and complete.   IF YOU HAVE BEEN REFERRED TO A SPECIALIST, IT MAY TAKE 1-2 WEEKS TO SCHEDULE/PROCESS THE REFERRAL. IF YOU HAVE NOT HEARD FROM US/SPECIALIST IN TWO WEEKS, PLEASE GIVE [place] A CALL AT [phone].

## 2024-02-22 NOTE — Assessment & Plan Note (Signed)
 Noticed today. Denies pain, refused getting xray

## 2024-02-22 NOTE — Assessment & Plan Note (Addendum)
 On Eliquis 5 mg BID d/t PAF per cardiology

## 2024-02-22 NOTE — Assessment & Plan Note (Signed)
 Resolved. Advised to also elevate feet above heart level.

## 2024-02-23 ENCOUNTER — Encounter: Payer: Self-pay | Admitting: Dermatology

## 2024-02-23 ENCOUNTER — Ambulatory Visit: Payer: Medicare Other | Admitting: Dermatology

## 2024-02-23 VITALS — BP 98/70 | HR 94

## 2024-02-23 DIAGNOSIS — Z5111 Encounter for antineoplastic chemotherapy: Secondary | ICD-10-CM | POA: Diagnosis not present

## 2024-02-23 DIAGNOSIS — C44311 Basal cell carcinoma of skin of nose: Secondary | ICD-10-CM

## 2024-02-23 DIAGNOSIS — C4491 Basal cell carcinoma of skin, unspecified: Secondary | ICD-10-CM

## 2024-02-23 DIAGNOSIS — L821 Other seborrheic keratosis: Secondary | ICD-10-CM

## 2024-02-23 MED ORDER — IMIQUIMOD 5 % EX CREA: TOPICAL_CREAM | Freq: Every day | CUTANEOUS | 1 refills | Status: DC

## 2024-02-23 NOTE — Patient Instructions (Addendum)
 Hello  Molly Meyer,  Thank you for visiting [place] today.   Here is a summary of the key instructions from today's consultation:  Diagnosis: Basal Cell Carcinoma of the nose  We discussed treatment options:  Considering the patient's advanced age (turning 45 next week) and preference, a conservative treatment approach has been chosen  Medication: Imiquimod 5% cream   Usage: Apply once daily at night using a Q-tip. Begin the day after your birthday and continue for the 2 months.   Pick-up Location: [facility] [address].  Follow-Up: We have scheduled a follow-up appointment in 2 months to evaluate your progress and initiate further treatment options, which will include freezing the affected area if necessary.  Please remember to use a new Q-tip for each application, and note that the packet can be reused for each application. Should you have any questions or encounter any issues during your treatment, please do not hesitate to contact our office.  Wishing you a very happy birthday next week, and we look forward to seeing the positive changes at your next visit.  Best regards,  Dr. Lang[facility] Re[facility]    Important Information   Due to recent changes in healthcare laws, you may see results of your pathology and/or laboratory studies on MyChart before the doctors have had a chance to review them. We understand that in some cases there may be results that are confusing or concerning to you. Please understand that not all results are received at the same time and often the doctors may need to interpret multiple results in order to provide you with the best plan of care or course of treatment. Therefore, we ask that you please give [place] 2 business days to thoroughly review all your results before contacting the office for clarification. Should we see a critical lab result, you will be contacted sooner.     If You Need Anything After Your Visit   If you have any questions or concerns  for your doctor, please call our main line at [phone]. If no one answers, please leave a voicemail as directed and we will return your call as soon as possible. Messages left after 4 pm will be answered the following business day.    You may also send [place] a message via MyChart. We typically respond to MyChart messages within 1-2 business days.  For prescription refills, please ask your pharmacy to contact our office. Our fax number is [phone].  If you have an urgent issue when the clinic is closed that cannot wait until the next business day, you can page your doctor at the number below.     Please note that while we do our best to be available for urgent issues outside of office hours, we are not available 24/7.    If you have an urgent issue and are unable to reach [place], you may choose to seek medical care at your doctor's office, retail clinic, urgent care center, or emergency room.   If you have a medical emergency, please immediately call 911 or go to the emergency department. In the event of inclement weather, please call our main line at [phone] for an update on the status of any delays or closures.  Dermatology Medication Tips: Please keep the boxes that topical medications come in in order to help keep track of the instructions about where and how to use these. Pharmacies typically print the medication instructions only on the boxes and not directly on the medication tubes.   If  your medication is too expensive, please contact our office at [phone] or send [place] a message through MyChart.    We are unable to tell what your co-pay for medications will be in advance as this is different depending on your insurance coverage. However, we may be able to find a substitute medication at lower cost or fill out paperwork to get insurance to cover a needed medication.    If a prior authorization is required to get your medication covered by your insurance company, please allow [place]  1-2 business days to complete this process.   Drug prices often vary depending on where the prescription is filled and some pharmacies may offer cheaper prices.   The website www.goodrx.com contains coupons for medications through different pharmacies. The prices here do not account for what the cost may be with help from insurance (it may be cheaper with your insurance), but the website can give you the price if you did not use any insurance.  - You can print the associated coupon and take it with your prescription to the pharmacy.  - You may also stop by our office during regular business hours and pick up a GoodRx coupon card.  - If you need your prescription sent electronically to a different pharmacy, notify our office through [facility] or by phone at [phone]

## 2024-02-23 NOTE — Progress Notes (Signed)
   Follow-Up Visit   Subjective  Molly Meyer is a 89 y.o. female who presents for the following: BCC Treatment Option Conversation  Patient present today for follow up visit for  [facility] Treatment Option Conversation. Patient was last evaluated on 12/01/23. At this visit a bx was completed. Patient denies medication changes.  The following portions of the chart were reviewed this encounter and updated as appropriate: medications, allergies, medical history  Review of Systems:  No other skin or systemic complaints except as noted in HPI or Assessment and Plan.  Objective  Well appearing patient in no apparent distress; mood and affect are within normal limits.  A focused examination was performed of the following areas: Right Nasal Wall  Relevant exam findings are noted in the Assessment and Plan.  Pathology Results Diagnosis 1. Skin , right nasal sidewall BASAL CELL CARCINOMA, NODULAR PATTERN, DEEP MARGIN INVOLVED 2. Skin , mid occipital scalp PIGMENTED SEBORRHEIC KERATOSIS, PERIPHERAL MARGIN INVOLVED    Assessment & Plan   BASAL CELL CARCINOMA OF THE SKIN Assessment: Newly diagnosed basal cell carcinoma on the nose with deep margins involved, confirmed by biopsy. Considering the patient's advanced age (turning 77 next week) and her personal preference, a conservative treatment approach has been chosen.  Plan:  Reviewed bx results and treatment options in detial with patient (SE vs ED&C, vs Cryosurgery & Topical chemo cream)   Initiate topical imiquimod 5% cream treatment. Apply once daily at night using a Q-tip, starting the day after the patient's birthday. Continue for 1 month.    Follow-up appointment in 2 months where cryotherapy will be added.    [facility] [address]    Instruct patient to call if any questions or issues arise.  Bx Proven Seborrheic Keratosis Scalp Lesion Assessment: Biopsy of a scalp lesion  confirmed it to be benign.  Plan:  Reviewed bx results in detail with patient Reassured this is a benign lesion No further intervention is required for this lesion. BASAL CELL CARCINOMA (BCC), UNSPECIFIED SITE   Related Medications imiquimod (ALDARA) 5 % cream Apply topically at bedtime.  Return in about 2 months (around 04/22/2024) for [facility] F/U .  Documentation: I have reviewed the above documentation for accuracy and completeness, and I agree with the above.  Stasia Cavalier, am acting as scribe for Langston Reusing, DO.  Langston Reusing, DO

## 2024-03-23 ENCOUNTER — Other Ambulatory Visit: Payer: Self-pay

## 2024-03-23 DIAGNOSIS — L309 Dermatitis, unspecified: Secondary | ICD-10-CM

## 2024-03-23 MED ORDER — TRIAMCINOLONE ACETONIDE 0.1 % EX OINT: 1.0000 | TOPICAL_OINTMENT | Freq: Two times a day (BID) | CUTANEOUS | 1 refills | Status: DC

## 2024-03-27 ENCOUNTER — Encounter: Payer: Self-pay | Admitting: Podiatry

## 2024-03-27 ENCOUNTER — Ambulatory Visit (INDEPENDENT_AMBULATORY_CARE_PROVIDER_SITE_OTHER): Payer: Medicare Other | Admitting: Podiatry

## 2024-03-27 VITALS — Ht 67.0 in | Wt 150.0 lb

## 2024-03-27 DIAGNOSIS — M79674 Pain in right toe(s): Secondary | ICD-10-CM

## 2024-03-27 DIAGNOSIS — B351 Tinea unguium: Secondary | ICD-10-CM

## 2024-03-27 DIAGNOSIS — E1142 Type 2 diabetes mellitus with diabetic polyneuropathy: Secondary | ICD-10-CM | POA: Diagnosis not present

## 2024-03-27 DIAGNOSIS — M79675 Pain in left toe(s): Secondary | ICD-10-CM

## 2024-03-30 ENCOUNTER — Encounter: Payer: Self-pay | Admitting: Podiatry

## 2024-03-30 NOTE — Progress Notes (Signed)
 ANNUAL DIABETIC FOOT EXAM  Subjective: Molly Meyer presents today for annual diabetic foot exam. Chief Complaint  Patient presents with   dfc    "Well they tell me I am stable on my diabetes, PCP is  [name] seen  months, and I am needing my nails trimmed"   Patient confirms h/o diabetes.  Patient denies any h/o foot wounds.  [name], MD is patient's PCP. LOV 10/17/2023.  Past Medical History:  Diagnosis Date   BCC (basal cell carcinoma) 12/06/2023   Right nasal sidewall - clinic f/u needed to discuss Tx     Degenerative disc disease, lumbar    Diabetes mellitus without complication (HCC)    Hypertension    Patient Active Problem List   Diagnosis Date Noted   Discoloration of skin of toe 02/22/2024   Varicose veins of foot 02/22/2024   Edema of left lower leg 02/22/2024   BCC (basal cell carcinoma) 12/06/2023   Left ear impacted cerumen 10/17/2023   Chronic constipation 10/17/2023   Permanent atrial fibrillation (HCC) 08/29/2023   COVID 08/29/2023   Acquired thrombophilia (HCC) 04/18/2023   Family history of colon cancer 10/13/2022   Hypertensive heart and renal disease 06/30/2022   Cellulitis 01/01/2022   Atrial fibrillation with rapid ventricular response (HCC) 12/31/2021   Vitamin B12 deficiency 12/28/2021   Abnormal gait due to peripheral sensory disorder 08/21/2021   Lumbar radiculopathy 08/21/2021   Hypertension 08/06/2021   Diabetes mellitus without complication (HCC) 08/06/2021   Degenerative disc disease, lumbar 08/06/2021   Screening for malignant neoplasm of colon 03/20/2021   Diverticular disease of colon 12/16/2020   Family history of malignant neoplasm of gastrointestinal tract 12/16/2020   Urge incontinence of urine 06/16/2020   Dyslipidemia 09/14/2019   AAA (abdominal aortic aneurysm) without rupture 10/16/2018   Type 2 diabetes mellitus with chronic kidney disease (HCC) 09/15/2018   Benign hypertension with CKD (chronic kidney  disease), stage II 09/15/2018   Nephropathy 09/15/2018   Onychomycosis 01/01/2015   Subluxation of foot, acquired 01/01/2015   Pain in lower limb 01/01/2015   Diabetic neuropathy associated with diabetes mellitus due to underlying condition (HCC) 01/01/2015   Diabetic neuropathy (HCC) 01/01/2015   Disorder of kidney 07/21/2014   Kidney disease 07/21/2014   Malaise and fatigue 05/05/2014   Disorder of nasal cavity 04/28/2014   Acute upper respiratory infection 04/28/2014   Seborrheic keratosis 10/18/2013   Diabetic kidney (HCC) 10/18/2013   Benign hypertensive kidney disease 10/18/2013   Chronic kidney disease (CKD), stage II (mild) 10/18/2013   Hypercholesterolemia without hypertriglyceridemia 10/18/2013   Disorder of bone and articular cartilage 10/18/2013   Arthralgia of multiple joints 10/18/2013   Frank hematuria 10/18/2013   Chronic renal disease, stage II 10/18/2013   Overweight (BMI 25.0-29.9) 10/17/2013   Past Surgical History:  Procedure Laterality Date   KNEE SURGERY     Current Outpatient Medications on File Prior to Visit  Medication Sig Dispense Refill   acetaminophen (TYLENOL) 500 MG tablet Take 500 mg by mouth every 6 (six) hours as needed for moderate pain.     Cholecalciferol (VITAMIN D) 50 MCG (2000 UT) tablet Take 2,000 Units by mouth daily.     cyanocobalamin (,VITAMIN B-12,) 1000 MCG/ML injection Inject 1,000 mcg into the muscle See admin instructions. Every 6 or 8 week     diltiazem (CARDIZEM CD) 180 MG 24 hr capsule TAKE 1 CAPSULE BY MOUTH EVERY DAY 90 capsule 3   ELIQUIS 5 MG TABS tablet TAKE  1 TABLET(5 MG) BY MOUTH TWICE DAILY 60 tablet 5   imiquimod (ALDARA) 5 % cream Apply topically at bedtime. 24 each 1   LINZESS 72 MCG capsule TAKE 1 CAPSULE BY MOUTH DAILY BEFORE BREAKFAST. 90 capsule 0   lisinopril-hydrochlorothiazide (ZESTORETIC) 10-12.5 MG tablet TAKE 1 TABLET BY MOUTH EVERY DAY 90 tablet 2   loratadine (CLARITIN) 10 MG tablet TAKE 1 TABLET BY  MOUTH EVERY DAY AS NEEDED FOR ALLERGY 90 tablet 1   Propylene Glycol (SYSTANE COMPLETE OP) Place 1 drop into both eyes daily as needed (dry eyes).     rosuvastatin (CRESTOR) 20 MG tablet Take 1 tablet (20 mg total) by mouth daily. 90 tablet 1   simvastatin (ZOCOR) 40 MG tablet Take 40 mg by mouth at bedtime.     triamcinolone ointment (KENALOG) 0.1 % Apply 1 Application topically 2 (two) times daily. APPLY 2 TIMES DAILY TO NOSE FOR 10 DAYS THEN STOP 80 g 1   No current facility-administered medications on file prior to visit.    Allergies  Allergen Reactions   Penicillins Rash    Thinks she may be able to take some forms now.Has patient had a PCN reaction causing immediate rash, facial/tongue/throat swelling, SOB or lightheadedness with hypotension: No Has patient had a PCN reaction causing severe rash involving mucus membranes or skin necrosis: No Has patient had a PCN reaction that required hospitalization No Has patient had a PCN reaction occurring within the last 10 years: No If all of the above answers are "NO", then may proceed with Cephalosporin use.   Social History   Occupational History   Occupation: retired  Tobacco Use   Smoking status: Never    Passive exposure: Never   Smokeless tobacco: Never  Vaping Use   Vaping status: Never Used  Substance and Sexual Activity   Alcohol use: No    Alcohol/week: 0.0 standard drinks of alcohol   Drug use: Never   Sexual activity: Not Currently   Family History  Problem Relation Age of Onset   Hyperlipidemia Father    Immunization History  Administered Date(s) Administered   Fluad Quad(high Dose 65+) 10/04/2021, 09/07/2022   Fluad Trivalent(High Dose 65+) 10/17/2023   Influenza, High Dose Seasonal PF 10/01/2015, 10/09/2016, 10/07/2017, 09/28/2018, 10/03/2019, 10/20/2020   PFIZER(Purple Top)SARS-COV-2 Vaccination 01/15/2020, 02/05/2020, 10/20/2020   PNEUMOCOCCAL CONJUGATE-20 06/10/2022   Pneumococcal Polysaccharide-23  07/03/2019   Tdap 11/15/2021     Review of Systems: Negative except as noted in the HPI.   Objective: There were no vitals filed for this visit.  Molly Meyer is a pleasant 89 y.o. female in NAD. AAO X 3.  Diabetic foot exam was performed with the following findings:   Vascular Examination: Capillary refill time immediate b/l. Vascular status intact b/l with palpable pedal pulses. Pedal hair present b/l. No pain with calf compression b/l. Skin temperature gradient WNL b/l. No cyanosis or clubbing b/l. No ischemia or gangrene noted b/l.   Neurological Examination: Vibratory sensation intact b/l. Protective sensation decreased with 10 gram monofilament b/l.  Dermatological Examination: Pedal skin with normal turgor, texture and tone b/l.  No open wounds. No interdigital macerations.   Toenails 1-5 b/l thick, discolored, elongated with subungual debris and pain on dorsal palpation.   No corns, calluses nor porokeratotic lesions noted.  Musculoskeletal Examination: Muscle strength 5/5 to all lower extremity muscle groups bilaterally. HAV with bunion deformity noted b/l LE. Hammertoe deformity noted 2-5 b/l. Pes planus deformity noted bilateral LE.  Radiographs: None  Lab Results  Component Value Date   HGBA1C 6.2 (H) 01/03/2024   ADA Risk Categorization: High Risk  Patient has one or more of the following: Loss of protective sensation Absent pedal pulses Severe Foot deformity History of foot ulcer  Assessment: 1. Pain due to onychomycosis of toenails of both feet   2. Diabetic peripheral neuropathy associated with type 2 diabetes mellitus (HCC)     Plan: Diabetic foot examination performed today. All patient's and/or POA's questions/concerns addressed on today's visit. Toenails 1-5 debrided in length and girth without incident. Continue foot and shoe inspections daily. Monitor blood glucose per PCP/Endocrinologist's recommendations. Continue soft, supportive shoe  gear daily. Report any pedal injuries to medical professional. Call office if there are any questions/concerns. -Patient/POA to call should there be question/concern in the interim. Return in about 3 months (around 06/26/2024).  Freddie Breech, DPM      [facility] LOCATION: [address], [place] [address]                   Office [phone]   [facility] LOCATION: [address] [place], [place] [address] Office [phone]

## 2024-05-01 ENCOUNTER — Ambulatory Visit (INDEPENDENT_AMBULATORY_CARE_PROVIDER_SITE_OTHER): Payer: Medicare Other | Admitting: Dermatology

## 2024-05-01 VITALS — BP 105/69

## 2024-05-01 DIAGNOSIS — D485 Neoplasm of uncertain behavior of skin: Secondary | ICD-10-CM

## 2024-05-01 DIAGNOSIS — L988 Other specified disorders of the skin and subcutaneous tissue: Secondary | ICD-10-CM

## 2024-05-01 DIAGNOSIS — L989 Disorder of the skin and subcutaneous tissue, unspecified: Secondary | ICD-10-CM | POA: Diagnosis not present

## 2024-05-01 DIAGNOSIS — Z85828 Personal history of other malignant neoplasm of skin: Secondary | ICD-10-CM | POA: Diagnosis not present

## 2024-05-01 DIAGNOSIS — D492 Neoplasm of unspecified behavior of bone, soft tissue, and skin: Secondary | ICD-10-CM

## 2024-05-01 NOTE — Progress Notes (Unsigned)
   Follow-Up Visit   Subjective  Molly Meyer is a 89 y.o. female who presents for the following: Recheck biopsy proven BCC of right nasal sidewall - She treated with Imiquimod  cream for 1 month. She finished about one month ago. She got blistered   The following portions of the chart were reviewed this encounter and updated as appropriate: medications, allergies, medical history  Review of Systems:  No other skin or systemic complaints except as noted in HPI or Assessment and Plan.  Objective  Well appearing patient in no apparent distress; mood and affect are within normal limits.  A focused examination was performed of the following areas: Nose  Relevant exam findings are noted in the Assessment and Plan.  Right Ala Nasi 0.6 cm white pearly papule   Assessment & Plan   HISTORY OF BASAL CELL CARCINOMA OF RIGHT NASAL SIDEWALL S/P DEBULKING WITH BIOPSY AND IMIQUIMOD  CREAM X 1 MONTH - No evidence of recurrence today - Recommend regular full body skin exams - Recommend daily broad spectrum sunscreen SPF 30+ to sun-exposed areas, reapply every 2 hours as needed.  - Call if any new or changing lesions are noted between office visits   NEOPLASM OF UNCERTAIN BEHAVIOR OF SKIN Right Ala Nasi Skin / nail biopsy Type of biopsy: tangential   Informed consent: discussed and consent obtained   Timeout: patient name, date of birth, surgical site, and procedure verified   Procedure prep:  Patient was prepped and draped in usual sterile fashion Prep type:  Isopropyl alcohol Anesthesia: the lesion was anesthetized in a standard fashion   Anesthetic:  1% lidocaine  w/ epinephrine 1-100,000 buffered w/ 8.4% NaHCO3 Instrument used: flexible razor blade   Hemostasis achieved with: pressure, aluminum chloride and electrodesiccation   Outcome: patient tolerated procedure well   Post-procedure details: sterile dressing applied and wound care instructions given   Dressing type: bandage and  petrolatum   Specimen 1 - Surgical pathology Differential Diagnosis: R/O BCC vs sebaceous hyperplasia vs cyst vs other  Check Margins: No  Return in about 6 months (around 11/01/2024) for TBSE.  I, Eliot Guernsey, CMA, am acting as scribe for [employer], DO .   Documentation: I have reviewed the above documentation for accuracy and completeness, and I agree with the above.  Louana Roup, DO

## 2024-05-01 NOTE — Patient Instructions (Addendum)
 Hello  Molly Meyer,  Thank you for visiting today. Here is a summary of the key instructions:  The basal cell carcinoma on the nose we treated with topical imiquimod  has fully resolved   - Procedure: A shave biopsy was performed on a 6-millimeter suspicious,white pearly papule on the nasal crease  - Wound Care:   - Apply alcohol to the biopsy site twice a day for 2 weeks   - Use Aquaphor (a Vaseline-like salve) on the biopsy site to help it heal  - Follow-up:   - We will contact you with biopsy results via MyChart message or phone call   - Further steps will be discussed if the biopsy shows skin cancer  Please reach out if you have any questions or concerns.  Warm regards,  Dr. Louana Roup, Dermatology   Patient Handout: Wound Care for Skin Biopsy Site  Taking Care of Your Skin Biopsy Site  Proper care of the biopsy site is essential for promoting healing and minimizing scarring. This handout provides instructions on how to care for your biopsy site to ensure optimal recovery.  1. Cleaning the Wound:  Clean the biopsy site daily with gentle soap and water. Gently pat the area dry with a clean, soft towel. Avoid harsh scrubbing or rubbing the area, as this can irritate the skin and delay healing.  2. Applying Aquaphor and Bandage:  After cleaning the wound, apply a thin layer of Aquaphor ointment to the biopsy site. Cover the area with a sterile bandage to protect it from dirt, bacteria, and friction. Change the bandage daily or as needed if it becomes soiled or wet.  3. Continued Care for One Week:  Repeat the cleaning, Aquaphor application, and bandaging process daily for one week following the biopsy procedure. Keeping the wound clean and moist during this initial healing period will help prevent infection and promote optimal healing.  4. Massaging Aquaphor into the Area:  ---After one week, discontinue the use of bandages but continue to apply Aquaphor to the biopsy  site. ----Gently massage the Aquaphor into the area using circular motions. ---Massaging the skin helps to promote circulation and prevent the formation of scar tissue.   Additional Tips:  Avoid exposing the biopsy site to direct sunlight during the healing process, as this can cause hyperpigmentation or worsen scarring. If you experience any signs of infection, such as increased redness, swelling, warmth, or drainage from the wound, contact your healthcare provider immediately. Follow any additional instructions provided by your healthcare provider for caring for the biopsy site and managing any discomfort. Conclusion:  Taking proper care of your skin biopsy site is crucial for ensuring optimal healing and minimizing scarring. By following these instructions for cleaning, applying Aquaphor, and massaging the area, you can promote a smooth and successful recovery. If you have any questions or concerns about caring for your biopsy site, don't hesitate to contact your healthcare provider for guidance.   Important Information  Due to recent changes in healthcare laws, you may see results of your pathology and/or laboratory studies on MyChart before the doctors have had a chance to review them. We understand that in some cases there may be results that are confusing or concerning to you. Please understand that not all results are received at the same time and often the doctors may need to interpret multiple results in order to provide you with the best plan of care or course of treatment. Therefore, we ask that you please give us  2 business days  to thoroughly review all your results before contacting the office for clarification. Should we see a critical lab result, you will be contacted sooner.   If You Need Anything After Your Visit  If you have any questions or concerns for your doctor, please call our main line at [phone] If no one answers, please leave a voicemail as directed and we will  return your call as soon as possible. Messages left after 4 pm will be answered the following business day.   You may also send us  a message via MyChart. We typically respond to MyChart messages within 1-2 business days.  For prescription refills, please ask your pharmacy to contact our office. Our fax number is [phone].  If you have an urgent issue when the clinic is closed that cannot wait until the next business day, you can page your doctor at the number below.    Please note that while we do our best to be available for urgent issues outside of office hours, we are not available 24/7.   If you have an urgent issue and are unable to reach us , you may choose to seek medical care at your doctor's office, retail clinic, urgent care center, or emergency room.  If you have a medical emergency, please immediately call 911 or go to the emergency department. In the event of inclement weather, please call our main line at [phone] for an update on the status of any delays or closures.  Dermatology Medication Tips: Please keep the boxes that topical medications come in in order to help keep track of the instructions about where and how to use these. Pharmacies typically print the medication instructions only on the boxes and not directly on the medication tubes.   If your medication is too expensive, please contact our office at [phone] or send us  a message through MyChart.   We are unable to tell what your co-pay for medications will be in advance as this is different depending on your insurance coverage. However, we may be able to find a substitute medication at lower cost or fill out paperwork to get insurance to cover a needed medication.   If a prior authorization is required to get your medication covered by your insurance company, please allow us  1-2 business days to complete this process.  Drug prices often vary depending on where the prescription is filled and some pharmacies  may offer cheaper prices.  The website www.goodrx.com contains coupons for medications through different pharmacies. The prices here do not account for what the cost may be with help from insurance (it may be cheaper with your insurance), but the website can give you the price if you did not use any insurance.  - You can print the associated coupon and take it with your prescription to the pharmacy.  - You may also stop by our office during regular business hours and pick up a GoodRx coupon card.  - If you need your prescription sent electronically to a different pharmacy, notify our office through [facility] or by phone at [phone]

## 2024-05-03 ENCOUNTER — Ambulatory Visit: Payer: Medicare Other | Admitting: Internal Medicine

## 2024-05-04 LAB — SURGICAL PATHOLOGY

## 2024-05-14 ENCOUNTER — Ambulatory Visit: Payer: Self-pay | Admitting: Dermatology

## 2024-05-24 ENCOUNTER — Ambulatory Visit (INDEPENDENT_AMBULATORY_CARE_PROVIDER_SITE_OTHER): Admitting: Internal Medicine

## 2024-05-24 ENCOUNTER — Encounter: Payer: Self-pay | Admitting: Internal Medicine

## 2024-05-24 VITALS — BP 110/62 | HR 100 | Temp 97.6°F | Ht 67.0 in | Wt 148.2 lb

## 2024-05-24 DIAGNOSIS — I4821 Permanent atrial fibrillation: Secondary | ICD-10-CM | POA: Diagnosis not present

## 2024-05-24 DIAGNOSIS — R269 Unspecified abnormalities of gait and mobility: Secondary | ICD-10-CM

## 2024-05-24 DIAGNOSIS — E7800 Pure hypercholesterolemia, unspecified: Secondary | ICD-10-CM | POA: Diagnosis not present

## 2024-05-24 DIAGNOSIS — N182 Chronic kidney disease, stage 2 (mild): Secondary | ICD-10-CM | POA: Diagnosis not present

## 2024-05-24 DIAGNOSIS — I1310 Hypertensive heart and chronic kidney disease without heart failure, with stage 1 through stage 4 chronic kidney disease, or unspecified chronic kidney disease: Secondary | ICD-10-CM | POA: Diagnosis not present

## 2024-05-24 DIAGNOSIS — E1122 Type 2 diabetes mellitus with diabetic chronic kidney disease: Secondary | ICD-10-CM | POA: Diagnosis not present

## 2024-05-24 DIAGNOSIS — Z9989 Dependence on other enabling machines and devices: Secondary | ICD-10-CM | POA: Diagnosis not present

## 2024-05-24 MED ORDER — LISINOPRIL-HYDROCHLOROTHIAZIDE 10-12.5 MG PO TABS: 1.0000 | ORAL_TABLET | Freq: Every day | ORAL | 2 refills | Status: DC

## 2024-05-24 NOTE — Progress Notes (Signed)
 I,Molly J Llittleton, CMA,acting as a Neurosurgeon for Molly Dung, MD.,have documented all relevant documentation on the behalf of Molly Dung, MD,as directed by  Molly Dung, MD while in the presence of Molly Dung, MD.  Subjective:  Patient ID: Molly Meyer , female    DOB: May 06, 1924 , 89 y.o.   MRN: [id number]  Chief Complaint  Patient presents with   Diabetes    Patient presents today for a dm check. Patient doesn't have any specific questions or concerns. Denies headaches, chest pain & sob     HPI Discussed the use of AI scribe software for clinical note transcription with the patient, who gave verbal consent to proceed.  History of Present Illness Molly Meyer is a 89 year old female with diabetes and hypertension who presents for a routine check-up.  She experiences difficulty with walking, describing her steps as slow and expressing a need for motivation to increase her physical activity. She lives alone in a condo and acknowledges the importance of staying active to reduce her risk of falls.  She recalls previous physical therapy sessions for her spine, which she found beneficial, although she has not consistently followed the recommended exercises.  Her current medication regimen includes rosuvastatin , for hypertension, she takes lisinopril , hydrochlorothiazide , and diltiazem  180 mg daily. Additionally, she is on Eliquis  5 mg twice daily. She is not currently on any medication for diabetes.   Diabetes She presents for her follow-up diabetic visit. She has type 2 diabetes mellitus. Her disease course has been stable. There are no hypoglycemic associated symptoms. Pertinent negatives for hypoglycemia include no dizziness. Pertinent negatives for diabetes include no blurred vision and no chest pain. There are no hypoglycemic complications. Diabetic complications include nephropathy. Risk factors for coronary artery disease include diabetes mellitus, dyslipidemia,  hypertension, post-menopausal and sedentary lifestyle. Her weight is stable. Her breakfast blood glucose is taken between 8-9 am. Her breakfast blood glucose range is generally 90-110 mg/dl. An ACE inhibitor/angiotensin II receptor blocker is being taken. She sees a podiatrist.Eye exam is current.  Hypertension This is a chronic problem. The current episode started more than 1 year ago. The problem has been gradually improving since onset. The problem is controlled. Pertinent negatives include no blurred vision, chest pain, palpitations or shortness of breath.     Past Medical History:  Diagnosis Date   BCC (basal cell carcinoma) 12/06/2023   Right nasal sidewall - clinic f/u needed to discuss Tx     Degenerative disc disease, lumbar    Diabetes mellitus without complication (HCC)    Hypertension      Family History  Problem Relation Age of Onset   Hyperlipidemia Father      Current Outpatient Medications:    acetaminophen  (TYLENOL ) 500 MG tablet, Take 500 mg by mouth every 6 (six) hours as needed for moderate pain., Disp: , Rfl:    Cholecalciferol (VITAMIN D ) 50 MCG (2000 UT) tablet, Take 2,000 Units by mouth daily., Disp: , Rfl:    cyanocobalamin  (,VITAMIN B-12,) 1000 MCG/ML injection, Inject 1,000 mcg into the muscle See admin instructions. Every 6 or 8 week, Disp: , Rfl:    diltiazem  (CARDIZEM  CD) 180 MG 24 hr capsule, TAKE 1 CAPSULE BY MOUTH EVERY DAY, Disp: 90 capsule, Rfl: 3   ELIQUIS  5 MG TABS tablet, TAKE 1 TABLET(5 MG) BY MOUTH TWICE DAILY, Disp: 60 tablet, Rfl: 5   LINZESS  72 MCG capsule, TAKE 1 CAPSULE BY MOUTH DAILY BEFORE BREAKFAST., Disp: 90  capsule, Rfl: 0   loratadine  (CLARITIN ) 10 MG tablet, TAKE 1 TABLET BY MOUTH EVERY DAY AS NEEDED FOR ALLERGY, Disp: 90 tablet, Rfl: 1   Propylene Glycol (SYSTANE COMPLETE OP), Place 1 drop into both eyes daily as needed (dry eyes)., Disp: , Rfl:    triamcinolone  ointment (KENALOG ) 0.1 %, Apply 1 Application topically 2 (two) times  daily. APPLY 2 TIMES DAILY TO NOSE FOR 10 DAYS THEN STOP, Disp: 80 g, Rfl: 1   imiquimod  (ALDARA ) 5 % cream, Apply topically at bedtime. (Patient not taking: Reported on 05/24/2024), Disp: 24 each, Rfl: 1   lisinopril -hydrochlorothiazide  (ZESTORETIC ) 10-12.5 MG tablet, Take 1 tablet by mouth daily., Disp: 90 tablet, Rfl: 2   rosuvastatin  (CRESTOR ) 20 MG tablet, Take 1 tablet (20 mg total) by mouth daily. (Patient not taking: Reported on 05/24/2024), Disp: 90 tablet, Rfl: 1   Allergies  Allergen Reactions   Penicillins Rash    Thinks she may be able to take some forms now.Has patient had a PCN reaction causing immediate rash, facial/tongue/throat swelling, SOB or lightheadedness with hypotension: No Has patient had a PCN reaction causing severe rash involving mucus membranes or skin necrosis: No Has patient had a PCN reaction that required hospitalization No Has patient had a PCN reaction occurring within the last 10 years: No If all of the above answers are "NO", then may proceed with Cephalosporin use.     Review of Systems  Constitutional: Negative.   Eyes: Negative.  Negative for blurred vision.  Respiratory: Negative.  Negative for shortness of breath.   Cardiovascular: Negative.  Negative for chest pain and palpitations.  Gastrointestinal: Negative.   Neurological:  Negative for dizziness.     Today's Vitals   05/24/24 1613  BP: 110/62  Pulse: 100  Temp: 97.6 F (36.4 C)  SpO2: 98%  Weight: 148 lb 3.2 oz (67.2 kg)  Height: 5\' 7"  (1.702 m)   Body mass index is 23.21 kg/m.  Wt Readings from Last 3 Encounters:  05/24/24 148 lb 3.2 oz (67.2 kg)  03/27/24 150 lb (68 kg)  02/22/24 150 lb (68 kg)     Objective:  Physical Exam Vitals and nursing note reviewed.  Constitutional:      Appearance: Normal appearance.  HENT:     Head: Normocephalic and atraumatic.  Eyes:     Extraocular Movements: Extraocular movements intact.  Cardiovascular:     Rate and Rhythm: Normal  rate. Rhythm irregular.     Heart sounds: Normal heart sounds.  Pulmonary:     Effort: Pulmonary effort is normal.     Breath sounds: Normal breath sounds.  Musculoskeletal:     Cervical back: Normal range of motion.     Comments: Ambulatory with walker  Skin:    General: Skin is warm.  Neurological:     General: No focal deficit present.     Mental Status: She is alert.  Psychiatric:        Mood and Affect: Mood normal.        Behavior: Behavior normal.         Assessment And Plan:  Type 2 diabetes mellitus with stage 2 chronic kidney disease, without long-term current use of insulin (HCC) Assessment & Plan: Chronic, this has been controlled without meds.  I will check labs as below. She has been able to control her diabetes with lifestyle changes. She will f/u in four to six months for re-evaluation.   Orders: -     BMP8+EGFR -  Hemoglobin A1c  Abnormality of gait Assessment & Plan: Decreased mobility and difficulty walking increase fall risk. Lives alone, requiring fall prevention measures. Agreed to start home physical therapy. - Arrange home physical therapy to improve lower body strength and reduce fall risk. Discuss scheduling with provider.  Orders: -     Ambulatory referral to Home Health  Hypertensive heart and renal disease with renal failure, stage 1 through stage 4 or unspecified chronic kidney disease, without heart failure Assessment & Plan: Chronic, well controlled.  She will continue with lisinopril /hct 10/12.5mg  and diltiaze CD 180mg  daily. Advised to follow a low sodium diet.   Orders: -     BMP8+EGFR  Permanent atrial fibrillation (HCC) Assessment & Plan: Chronic, currently on diltiazem  CD and Eliquis . Cardiology input appreciated. She is encouraged to stay well hydrated and to decrease caffeine intake.     Walker as ambulation aid -     Ambulatory referral to Home Health  Other orders -     Lisinopril -hydroCHLOROthiazide ; Take 1 tablet by  mouth daily.  Dispense: 90 tablet; Refill: 2   Return for 4 mon f/u for dm check.  Patient was given opportunity to ask questions. Patient verbalized understanding of the plan and was able to repeat key elements of the plan. All questions were answered to their satisfaction.    I, Molly Dung, MD, have reviewed all documentation for this visit. The documentation on 05/24/24 for the exam, diagnosis, procedures, and orders are all accurate and complete.   IF YOU HAVE BEEN REFERRED TO A SPECIALIST, IT MAY TAKE 1-2 WEEKS TO SCHEDULE/PROCESS THE REFERRAL. IF YOU HAVE NOT HEARD FROM US /SPECIALIST IN TWO WEEKS, PLEASE GIVE US  A CALL AT [phone] X [phone].

## 2024-05-25 LAB — BMP8+EGFR
BUN/Creatinine Ratio: 26 (ref 12–28)
BUN: 24 mg/dL (ref 10–36)
CO2: 21 mmol/L (ref 20–29)
Calcium: 9.7 mg/dL (ref 8.7–10.3)
Chloride: 103 mmol/L (ref 96–106)
Creatinine, Ser: 0.94 mg/dL (ref 0.57–1.00)
Glucose: 95 mg/dL (ref 70–99)
Potassium: 4.1 mmol/L (ref 3.5–5.2)
Sodium: 140 mmol/L (ref 134–144)
eGFR: 55 mL/min/{1.73_m2} — ABNORMAL LOW (ref >59)

## 2024-05-25 LAB — HEMOGLOBIN A1C
Est. average glucose Bld gHb Est-mCnc: 128 mg/dL
Hgb A1c MFr Bld: 6.1 % — ABNORMAL HIGH (ref 4.8–5.6)

## 2024-05-26 ENCOUNTER — Ambulatory Visit: Payer: Self-pay | Admitting: Internal Medicine

## 2024-05-26 DIAGNOSIS — R269 Unspecified abnormalities of gait and mobility: Secondary | ICD-10-CM | POA: Insufficient documentation

## 2024-05-26 DIAGNOSIS — E7800 Pure hypercholesterolemia, unspecified: Secondary | ICD-10-CM | POA: Insufficient documentation

## 2024-05-26 NOTE — Assessment & Plan Note (Signed)
 Chronic, well controlled.  She will continue with lisinopril /hct 10/12.5mg  and diltiaze CD 180mg  daily. Advised to follow a low sodium diet.

## 2024-05-26 NOTE — Assessment & Plan Note (Signed)
 Chronic, currently on diltiazem CD and Eliquis. Cardiology input appreciated. She is encouraged to stay well hydrated and to decrease caffeine intake.

## 2024-05-26 NOTE — Assessment & Plan Note (Signed)
 Chronic, on rosuvastatin  since January. No recent cholesterol check post-medication change. - Schedule cholesterol check to evaluate rosuvastatin  efficacy.

## 2024-05-26 NOTE — Assessment & Plan Note (Addendum)
 Chronic, this has been controlled without meds.  I will check labs as below. She has been able to control her diabetes with lifestyle changes. She will f/u in four to six months for re-evaluation.

## 2024-05-26 NOTE — Assessment & Plan Note (Signed)
 Decreased mobility and difficulty walking increase fall risk. Lives alone, requiring fall prevention measures. Agreed to start home physical therapy. - Arrange home physical therapy to improve lower body strength and reduce fall risk. Discuss scheduling with provider.

## 2024-05-28 ENCOUNTER — Other Ambulatory Visit: Payer: Self-pay | Admitting: Cardiology

## 2024-05-28 DIAGNOSIS — I4891 Unspecified atrial fibrillation: Secondary | ICD-10-CM

## 2024-05-28 NOTE — Telephone Encounter (Signed)
 Prescription refill request for Eliquis  received. Indication:afib Last office visit:2/25 Scr:0.94  5/25 Age: 89 Weight:67.2  kg  Prescription refilled

## 2024-05-29 ENCOUNTER — Ambulatory Visit: Admitting: Dermatology

## 2024-05-29 VITALS — BP 108/76 | HR 80

## 2024-05-29 DIAGNOSIS — Z7901 Long term (current) use of anticoagulants: Secondary | ICD-10-CM | POA: Diagnosis not present

## 2024-05-29 DIAGNOSIS — C44321 Squamous cell carcinoma of skin of nose: Secondary | ICD-10-CM

## 2024-05-29 DIAGNOSIS — I1310 Hypertensive heart and chronic kidney disease without heart failure, with stage 1 through stage 4 chronic kidney disease, or unspecified chronic kidney disease: Secondary | ICD-10-CM | POA: Diagnosis not present

## 2024-05-29 DIAGNOSIS — E1122 Type 2 diabetes mellitus with diabetic chronic kidney disease: Secondary | ICD-10-CM | POA: Diagnosis not present

## 2024-05-29 DIAGNOSIS — I4821 Permanent atrial fibrillation: Secondary | ICD-10-CM | POA: Diagnosis not present

## 2024-05-29 DIAGNOSIS — N182 Chronic kidney disease, stage 2 (mild): Secondary | ICD-10-CM | POA: Diagnosis not present

## 2024-05-29 DIAGNOSIS — Z604 Social exclusion and rejection: Secondary | ICD-10-CM | POA: Diagnosis not present

## 2024-05-29 NOTE — Progress Notes (Unsigned)
   Follow-Up Visit   Subjective  Molly Meyer is a 89 y.o. female who presents for the following: ED&C   Patient is present in office today to have ED&C of biopsy proven SCC.  The following portions of the chart were reviewed this encounter and updated as appropriate: medications, allergies, medical history  Review of Systems:  No other skin or systemic complaints except as noted in HPI or Assessment and Plan.  Objective  Well appearing patient in no apparent distress; mood and affect are within normal limits.  A focused examination was performed of the following areas: Right Nasal Crease  Relevant exam findings are noted in the Assessment and Plan.    Right Alar Crease SCC  Assessment & Plan   SQUAMOUS CELL CARCINOMA OF SKIN OF NOSE Right Alar Crease Destruction of lesion Complexity: extensive   Destruction method: electrodesiccation and curettage   Informed consent: discussed and consent obtained   Timeout:  patient name, date of birth, surgical site, and procedure verified Procedure prep:  Patient was prepped and draped in usual sterile fashion Prep type:  Isopropyl alcohol Anesthesia: the lesion was anesthetized in a standard fashion   Anesthetic:  1% lidocaine  w/ epinephrine 1-100,000 buffered w/ 8.4% NaHCO3 Curettage performed in three different directions: Yes   Electrodesiccation performed over the curetted area: Yes   Curettage cycles:  3 Hemostasis achieved with:  pressure, aluminum chloride and electrodesiccation Outcome: patient tolerated procedure well with no complications   Post-procedure details: sterile dressing applied and wound care instructions given   Dressing type: bandage and petrolatum    Return in about 6 months (around 11/28/2024) for TBSE.  I, Jetta Ager, am acting as Neurosurgeon for [employer], DO.  Documentation: I have reviewed the above documentation for accuracy and completeness, and I agree with the above.  Louana Roup, DO

## 2024-05-29 NOTE — Patient Instructions (Addendum)

## 2024-05-30 ENCOUNTER — Encounter: Payer: Self-pay | Admitting: Dermatology

## 2024-05-30 DIAGNOSIS — H04123 Dry eye syndrome of bilateral lacrimal glands: Secondary | ICD-10-CM | POA: Diagnosis not present

## 2024-05-30 DIAGNOSIS — H353132 Nonexudative age-related macular degeneration, bilateral, intermediate dry stage: Secondary | ICD-10-CM | POA: Diagnosis not present

## 2024-05-30 DIAGNOSIS — H43813 Vitreous degeneration, bilateral: Secondary | ICD-10-CM | POA: Diagnosis not present

## 2024-05-30 DIAGNOSIS — H26493 Other secondary cataract, bilateral: Secondary | ICD-10-CM | POA: Diagnosis not present

## 2024-05-30 DIAGNOSIS — E119 Type 2 diabetes mellitus without complications: Secondary | ICD-10-CM | POA: Diagnosis not present

## 2024-05-30 LAB — HM DIABETES EYE EXAM

## 2024-06-01 LAB — PROTEIN ELECTROPHORESIS
A/G Ratio: 1.3 (ref 0.7–1.7)
Albumin ELP: 3.4 g/dL (ref 2.9–4.4)
Alpha 1: 0.3 g/dL (ref 0.0–0.4)
Alpha 2: 0.6 g/dL (ref 0.4–1.0)
Beta: 0.8 g/dL (ref 0.7–1.3)
Gamma Globulin: 0.9 g/dL (ref 0.4–1.8)
Globulin, Total: 2.6 g/dL (ref 2.2–3.9)
Total Protein: 6 g/dL (ref 6.0–8.5)

## 2024-06-01 LAB — SPECIMEN STATUS REPORT

## 2024-06-08 ENCOUNTER — Encounter (HOSPITAL_COMMUNITY)

## 2024-06-08 ENCOUNTER — Encounter

## 2024-06-08 DIAGNOSIS — E1122 Type 2 diabetes mellitus with diabetic chronic kidney disease: Secondary | ICD-10-CM | POA: Diagnosis not present

## 2024-06-08 DIAGNOSIS — N182 Chronic kidney disease, stage 2 (mild): Secondary | ICD-10-CM | POA: Diagnosis not present

## 2024-06-08 DIAGNOSIS — Z7901 Long term (current) use of anticoagulants: Secondary | ICD-10-CM | POA: Diagnosis not present

## 2024-06-08 DIAGNOSIS — I1310 Hypertensive heart and chronic kidney disease without heart failure, with stage 1 through stage 4 chronic kidney disease, or unspecified chronic kidney disease: Secondary | ICD-10-CM | POA: Diagnosis not present

## 2024-06-08 DIAGNOSIS — Z604 Social exclusion and rejection: Secondary | ICD-10-CM | POA: Diagnosis not present

## 2024-06-08 DIAGNOSIS — I4821 Permanent atrial fibrillation: Secondary | ICD-10-CM | POA: Diagnosis not present

## 2024-06-13 DIAGNOSIS — E1122 Type 2 diabetes mellitus with diabetic chronic kidney disease: Secondary | ICD-10-CM | POA: Diagnosis not present

## 2024-06-13 DIAGNOSIS — Z7901 Long term (current) use of anticoagulants: Secondary | ICD-10-CM | POA: Diagnosis not present

## 2024-06-13 DIAGNOSIS — I1310 Hypertensive heart and chronic kidney disease without heart failure, with stage 1 through stage 4 chronic kidney disease, or unspecified chronic kidney disease: Secondary | ICD-10-CM | POA: Diagnosis not present

## 2024-06-13 DIAGNOSIS — Z604 Social exclusion and rejection: Secondary | ICD-10-CM | POA: Diagnosis not present

## 2024-06-13 DIAGNOSIS — N182 Chronic kidney disease, stage 2 (mild): Secondary | ICD-10-CM | POA: Diagnosis not present

## 2024-06-13 DIAGNOSIS — I4821 Permanent atrial fibrillation: Secondary | ICD-10-CM | POA: Diagnosis not present

## 2024-06-19 DIAGNOSIS — Z7901 Long term (current) use of anticoagulants: Secondary | ICD-10-CM | POA: Diagnosis not present

## 2024-06-19 DIAGNOSIS — E1122 Type 2 diabetes mellitus with diabetic chronic kidney disease: Secondary | ICD-10-CM | POA: Diagnosis not present

## 2024-06-19 DIAGNOSIS — I1310 Hypertensive heart and chronic kidney disease without heart failure, with stage 1 through stage 4 chronic kidney disease, or unspecified chronic kidney disease: Secondary | ICD-10-CM | POA: Diagnosis not present

## 2024-06-19 DIAGNOSIS — N182 Chronic kidney disease, stage 2 (mild): Secondary | ICD-10-CM | POA: Diagnosis not present

## 2024-06-19 DIAGNOSIS — I4821 Permanent atrial fibrillation: Secondary | ICD-10-CM | POA: Diagnosis not present

## 2024-06-19 DIAGNOSIS — Z604 Social exclusion and rejection: Secondary | ICD-10-CM | POA: Diagnosis not present

## 2024-06-25 DIAGNOSIS — E1122 Type 2 diabetes mellitus with diabetic chronic kidney disease: Secondary | ICD-10-CM | POA: Diagnosis not present

## 2024-06-25 DIAGNOSIS — Z604 Social exclusion and rejection: Secondary | ICD-10-CM | POA: Diagnosis not present

## 2024-06-25 DIAGNOSIS — N182 Chronic kidney disease, stage 2 (mild): Secondary | ICD-10-CM | POA: Diagnosis not present

## 2024-06-25 DIAGNOSIS — Z7901 Long term (current) use of anticoagulants: Secondary | ICD-10-CM | POA: Diagnosis not present

## 2024-06-25 DIAGNOSIS — I1310 Hypertensive heart and chronic kidney disease without heart failure, with stage 1 through stage 4 chronic kidney disease, or unspecified chronic kidney disease: Secondary | ICD-10-CM | POA: Diagnosis not present

## 2024-06-25 DIAGNOSIS — I4821 Permanent atrial fibrillation: Secondary | ICD-10-CM | POA: Diagnosis not present

## 2024-06-28 ENCOUNTER — Other Ambulatory Visit: Payer: Self-pay | Admitting: Cardiology

## 2024-06-28 DIAGNOSIS — I4821 Permanent atrial fibrillation: Secondary | ICD-10-CM | POA: Diagnosis not present

## 2024-06-28 DIAGNOSIS — I1310 Hypertensive heart and chronic kidney disease without heart failure, with stage 1 through stage 4 chronic kidney disease, or unspecified chronic kidney disease: Secondary | ICD-10-CM | POA: Diagnosis not present

## 2024-06-28 DIAGNOSIS — N182 Chronic kidney disease, stage 2 (mild): Secondary | ICD-10-CM | POA: Diagnosis not present

## 2024-06-28 DIAGNOSIS — Z7901 Long term (current) use of anticoagulants: Secondary | ICD-10-CM | POA: Diagnosis not present

## 2024-06-28 DIAGNOSIS — Z604 Social exclusion and rejection: Secondary | ICD-10-CM | POA: Diagnosis not present

## 2024-06-28 DIAGNOSIS — E1122 Type 2 diabetes mellitus with diabetic chronic kidney disease: Secondary | ICD-10-CM | POA: Diagnosis not present

## 2024-07-02 ENCOUNTER — Other Ambulatory Visit: Payer: Self-pay | Admitting: Vascular Surgery

## 2024-07-02 DIAGNOSIS — M7989 Other specified soft tissue disorders: Secondary | ICD-10-CM

## 2024-07-06 DIAGNOSIS — Z604 Social exclusion and rejection: Secondary | ICD-10-CM | POA: Diagnosis not present

## 2024-07-06 DIAGNOSIS — I4821 Permanent atrial fibrillation: Secondary | ICD-10-CM | POA: Diagnosis not present

## 2024-07-06 DIAGNOSIS — I1310 Hypertensive heart and chronic kidney disease without heart failure, with stage 1 through stage 4 chronic kidney disease, or unspecified chronic kidney disease: Secondary | ICD-10-CM | POA: Diagnosis not present

## 2024-07-06 DIAGNOSIS — N182 Chronic kidney disease, stage 2 (mild): Secondary | ICD-10-CM | POA: Diagnosis not present

## 2024-07-06 DIAGNOSIS — Z7901 Long term (current) use of anticoagulants: Secondary | ICD-10-CM | POA: Diagnosis not present

## 2024-07-06 DIAGNOSIS — E1122 Type 2 diabetes mellitus with diabetic chronic kidney disease: Secondary | ICD-10-CM | POA: Diagnosis not present

## 2024-07-10 ENCOUNTER — Encounter: Payer: Self-pay | Admitting: Podiatry

## 2024-07-10 ENCOUNTER — Ambulatory Visit (INDEPENDENT_AMBULATORY_CARE_PROVIDER_SITE_OTHER): Admitting: Podiatry

## 2024-07-10 DIAGNOSIS — M79675 Pain in left toe(s): Secondary | ICD-10-CM

## 2024-07-10 DIAGNOSIS — B351 Tinea unguium: Secondary | ICD-10-CM | POA: Diagnosis not present

## 2024-07-10 DIAGNOSIS — E1142 Type 2 diabetes mellitus with diabetic polyneuropathy: Secondary | ICD-10-CM | POA: Diagnosis not present

## 2024-07-10 DIAGNOSIS — M79674 Pain in right toe(s): Secondary | ICD-10-CM | POA: Diagnosis not present

## 2024-07-11 DIAGNOSIS — Z604 Social exclusion and rejection: Secondary | ICD-10-CM | POA: Diagnosis not present

## 2024-07-11 DIAGNOSIS — I4821 Permanent atrial fibrillation: Secondary | ICD-10-CM | POA: Diagnosis not present

## 2024-07-11 DIAGNOSIS — E1122 Type 2 diabetes mellitus with diabetic chronic kidney disease: Secondary | ICD-10-CM | POA: Diagnosis not present

## 2024-07-11 DIAGNOSIS — Z7901 Long term (current) use of anticoagulants: Secondary | ICD-10-CM | POA: Diagnosis not present

## 2024-07-11 DIAGNOSIS — N182 Chronic kidney disease, stage 2 (mild): Secondary | ICD-10-CM | POA: Diagnosis not present

## 2024-07-11 DIAGNOSIS — I1310 Hypertensive heart and chronic kidney disease without heart failure, with stage 1 through stage 4 chronic kidney disease, or unspecified chronic kidney disease: Secondary | ICD-10-CM | POA: Diagnosis not present

## 2024-07-12 ENCOUNTER — Encounter

## 2024-07-12 ENCOUNTER — Encounter (HOSPITAL_COMMUNITY)

## 2024-07-15 NOTE — Progress Notes (Signed)
  Subjective:  Patient ID: Molly Meyer, female    DOB: 1924-06-30,  MRN: [id number]  89 y.o. female presents at risk foot care with history of diabetic neuropathy and painful thick toenails that are difficult to trim. Pain interferes with ambulation. Aggravating factors include wearing enclosed shoe gear. Pain is relieved with periodic professional debridement.  New problem(s): None   PCP is Jarold Medici, MD , and last visit was May 24, 2024.  Allergies  Allergen Reactions   Penicillins Rash    Thinks she may be able to take some forms now.Has patient had a PCN reaction causing immediate rash, facial/tongue/throat swelling, SOB or lightheadedness with hypotension: No Has patient had a PCN reaction causing severe rash involving mucus membranes or skin necrosis: No Has patient had a PCN reaction that required hospitalization No Has patient had a PCN reaction occurring within the last 10 years: No If all of the above answers are NO, then may proceed with Cephalosporin use.    Review of Systems: Negative except as noted in the HPI.   Objective:  Molly Meyer is a pleasant 89 y.o. female in NAD. AAO x 3.  Vascular Examination: Vascular status intact b/l with palpable pedal pulses. CFT immediate b/l. Pedal hair present. No edema. No pain with calf compression b/l. Skin temperature gradient WNL b/l. No varicosities noted. No cyanosis or clubbing noted.  Neurological Examination: Protective sensation decreased with 10 gram monofilament b/l.  Dermatological Examination: Pedal skin with normal turgor, texture and tone b/l. No open wounds nor interdigital macerations noted. Toenails 1-5 b/l thick, discolored, elongated with subungual debris and pain on dorsal palpation. No hyperkeratotic lesions noted b/l.   Musculoskeletal Examination: Muscle strength 5/5 to b/l LE.  No pain, crepitus noted b/l. HAV with bunion bilaterally and hammertoes 2-5 b/l. Pes planus deformity noted  bilateral LE.  Radiographs: None  Last A1c:      Latest Ref Rng & Units 05/24/2024    4:49 PM 01/03/2024   12:13 PM 08/18/2023   11:57 AM  Hemoglobin A1C  Hemoglobin-A1c 4.8 - 5.6 % 6.1  6.2  6.3      Assessment:   1. Pain due to onychomycosis of toenails of both feet   2. Diabetic peripheral neuropathy associated with type 2 diabetes mellitus (HCC)    Plan:  Consent given for treatment. Patient examined. All patient's and/or POA's questions/concerns addressed on today's visit. Mycotic toenails 1-5 debrided in length and girth without incident. Continue foot and shoe inspections daily. Monitor blood glucose per PCP/Endocrinologist's recommendations.Continue soft, supportive shoe gear daily. Report any pedal injuries to medical professional. Call office if there are any quesitons/concerns. -Patient/POA to call should there be question/concern in the interim.  Return in about 3 months (around 10/10/2024).  Delon LITTIE Merlin, DPM      [place] [facility] LOCATION: [address], [place] [address]                   Office [phone]   [facility] LOCATION: [address] [place], [place] [address] Office [phone]

## 2024-07-18 DIAGNOSIS — E1122 Type 2 diabetes mellitus with diabetic chronic kidney disease: Secondary | ICD-10-CM | POA: Diagnosis not present

## 2024-07-18 DIAGNOSIS — Z7901 Long term (current) use of anticoagulants: Secondary | ICD-10-CM | POA: Diagnosis not present

## 2024-07-18 DIAGNOSIS — N182 Chronic kidney disease, stage 2 (mild): Secondary | ICD-10-CM | POA: Diagnosis not present

## 2024-07-18 DIAGNOSIS — I1310 Hypertensive heart and chronic kidney disease without heart failure, with stage 1 through stage 4 chronic kidney disease, or unspecified chronic kidney disease: Secondary | ICD-10-CM | POA: Diagnosis not present

## 2024-07-18 DIAGNOSIS — Z604 Social exclusion and rejection: Secondary | ICD-10-CM | POA: Diagnosis not present

## 2024-07-18 DIAGNOSIS — I4821 Permanent atrial fibrillation: Secondary | ICD-10-CM | POA: Diagnosis not present

## 2024-07-23 DIAGNOSIS — Z604 Social exclusion and rejection: Secondary | ICD-10-CM | POA: Diagnosis not present

## 2024-07-23 DIAGNOSIS — N182 Chronic kidney disease, stage 2 (mild): Secondary | ICD-10-CM | POA: Diagnosis not present

## 2024-07-23 DIAGNOSIS — Z7901 Long term (current) use of anticoagulants: Secondary | ICD-10-CM | POA: Diagnosis not present

## 2024-07-23 DIAGNOSIS — I4821 Permanent atrial fibrillation: Secondary | ICD-10-CM | POA: Diagnosis not present

## 2024-07-23 DIAGNOSIS — E1122 Type 2 diabetes mellitus with diabetic chronic kidney disease: Secondary | ICD-10-CM | POA: Diagnosis not present

## 2024-07-23 DIAGNOSIS — I1310 Hypertensive heart and chronic kidney disease without heart failure, with stage 1 through stage 4 chronic kidney disease, or unspecified chronic kidney disease: Secondary | ICD-10-CM | POA: Diagnosis not present

## 2024-08-25 ENCOUNTER — Other Ambulatory Visit: Payer: Self-pay | Admitting: Cardiology

## 2024-08-25 DIAGNOSIS — I4821 Permanent atrial fibrillation: Secondary | ICD-10-CM

## 2024-09-17 ENCOUNTER — Telehealth: Payer: Self-pay | Admitting: Cardiology

## 2024-09-17 NOTE — Telephone Encounter (Signed)
 I would defer to patient's primary cardiologist Dr. Pietro. I have not see this patient before.   Thanks MJP

## 2024-09-17 NOTE — Telephone Encounter (Signed)
 Pt is currently in the office of [facility] and has been bleeding for several days after tooth extraction and the surgeon would like to know if the patient can hold her Eliquis  for a few days.

## 2024-09-17 NOTE — Telephone Encounter (Signed)
 Spoke with christie at [employer] [employer], Aware of dr vertie recommendations. She is going to contact the patient.

## 2024-09-25 ENCOUNTER — Ambulatory Visit: Admitting: Internal Medicine

## 2024-09-25 ENCOUNTER — Encounter: Payer: Self-pay | Admitting: Internal Medicine

## 2024-09-25 VITALS — BP 80/60 | HR 81 | Temp 98.3°F | Ht 67.0 in | Wt 142.6 lb

## 2024-09-25 DIAGNOSIS — Z23 Encounter for immunization: Secondary | ICD-10-CM

## 2024-09-25 DIAGNOSIS — Z87898 Personal history of other specified conditions: Secondary | ICD-10-CM | POA: Diagnosis not present

## 2024-09-25 DIAGNOSIS — I1310 Hypertensive heart and chronic kidney disease without heart failure, with stage 1 through stage 4 chronic kidney disease, or unspecified chronic kidney disease: Secondary | ICD-10-CM

## 2024-09-25 DIAGNOSIS — I4821 Permanent atrial fibrillation: Secondary | ICD-10-CM | POA: Diagnosis not present

## 2024-09-25 DIAGNOSIS — R42 Dizziness and giddiness: Secondary | ICD-10-CM

## 2024-09-25 DIAGNOSIS — Z9989 Dependence on other enabling machines and devices: Secondary | ICD-10-CM | POA: Diagnosis not present

## 2024-09-25 DIAGNOSIS — R55 Syncope and collapse: Secondary | ICD-10-CM | POA: Diagnosis not present

## 2024-09-25 DIAGNOSIS — N182 Chronic kidney disease, stage 2 (mild): Secondary | ICD-10-CM | POA: Diagnosis not present

## 2024-09-25 DIAGNOSIS — E1122 Type 2 diabetes mellitus with diabetic chronic kidney disease: Secondary | ICD-10-CM | POA: Diagnosis not present

## 2024-09-25 DIAGNOSIS — K029 Dental caries, unspecified: Secondary | ICD-10-CM | POA: Insufficient documentation

## 2024-09-25 NOTE — Progress Notes (Unsigned)
 I,Victoria T Emmitt, CMA,acting as a Neurosurgeon for [name], MD.,have documented all relevant documentation on the behalf of [name], MD,as directed by  [name], MD while in the presence of [name], MD.  Subjective:  Patient ID: Molly Meyer , female    DOB: Jul 13, 1924 , 89 y.o.   MRN: [id number]  Chief Complaint  Patient presents with  . Diabetes    She presents today for DM/HTN check. She reports compliance with meds. She is alone today. Denies chest pain and shortness of breath. She has no specific questions or concerns.   . Hypertension    HPI Discussed the use of AI scribe software for clinical note transcription with the patient, who gave verbal consent to proceed.  History of Present Illness Molly Meyer is a 89 year old female with hypertension and atrial fibrillation who presents for a diabetes and blood pressure check.  Three weeks ago, she had three teeth extracted due to cavities between two teeth that were not saveable. At the time, she was on Eliquis , and the dental team advised that it was not necessary to stop the medication prior to the procedure. Post-extraction, she experienced continuous bleeding that did not resolve with gauze or tea bags. She returned to the oral surgeon, who performed a more extensive procedure and sutured the area. She was instructed to stop Eliquis  for two days before the second procedure and resumed it last Thursday. Currently, she is in the healing process with no further bleeding.  She describes a significant dietary impact due to the dental issues, consuming only cold foods like cottage cheese and yogurt, and avoiding hot foods. She has been cautious with her diet, opting for soft foods and energy drinks. She experienced an incident of syncope on Friday morning while in the kitchen. She thought she might have been dehydrated. Since then, she has increased her water intake and feels better, though she still takes  things slowly.  Her current medications include Cardizem  180 mg daily, Eliquis  daily, Linzess , lisinopril , and rosuvastatin . She confirms adherence to her prescribed medication regimen. No current bleeding and notes improvement in her condition after increasing her water intake.   She presents today for DM/HTN check. She reports compliance with meds. She is alone today. Denies chest pain and shortness of breath. She has no specific questions or concerns.   Diabetes She presents for her follow-up diabetic visit. She has type 2 diabetes mellitus. Her disease course has been stable. There are no hypoglycemic associated symptoms. Pertinent negatives for hypoglycemia include no dizziness. Pertinent negatives for diabetes include no blurred vision and no chest pain. There are no hypoglycemic complications. Diabetic complications include nephropathy. Risk factors for coronary artery disease include diabetes mellitus, dyslipidemia, hypertension, post-menopausal and sedentary lifestyle. Her weight is stable. Her breakfast blood glucose is taken between 8-9 am. Her breakfast blood glucose range is generally 90-110 mg/dl. An ACE inhibitor/angiotensin II receptor blocker is being taken. She sees a podiatrist.Eye exam is current.  Hypertension This is a chronic problem. The current episode started more than 1 year ago. The problem has been gradually improving since onset. The problem is controlled. Pertinent negatives include no blurred vision, chest pain, palpitations or shortness of breath.     Past Medical History:  Diagnosis Date  . BCC (basal cell carcinoma) 12/06/2023   Right nasal sidewall - clinic f/u needed to discuss Tx    . Degenerative disc disease, lumbar   . Diabetes mellitus  without complication (HCC)   . Hypertension      Family History  Problem Relation Age of Onset  . Hyperlipidemia Father      Current Outpatient Medications:  .  acetaminophen  (TYLENOL ) 500 MG tablet, Take 500 mg by  mouth every 6 (six) hours as needed for moderate pain., Disp: , Rfl:  .  Cholecalciferol (VITAMIN D ) 50 MCG (2000 UT) tablet, Take 2,000 Units by mouth daily., Disp: , Rfl:  .  cyanocobalamin  (,VITAMIN B-12,) 1000 MCG/ML injection, Inject 1,000 mcg into the muscle See admin instructions. Every 6 or 8 week, Disp: , Rfl:  .  diltiazem  (CARDIZEM  CD) 180 MG 24 hr capsule, TAKE 1 CAPSULE BY MOUTH EVERY DAY, Disp: 90 capsule, Rfl: 1 .  ELIQUIS  5 MG TABS tablet, TAKE 1 TABLET(5 MG) BY MOUTH TWICE DAILY, Disp: 60 tablet, Rfl: 5 .  LINZESS  72 MCG capsule, TAKE 1 CAPSULE BY MOUTH DAILY BEFORE BREAKFAST., Disp: 90 capsule, Rfl: 0 .  lisinopril -hydrochlorothiazide  (ZESTORETIC ) 10-12.5 MG tablet, Take 1 tablet by mouth daily., Disp: 90 tablet, Rfl: 2 .  loratadine  (CLARITIN ) 10 MG tablet, TAKE 1 TABLET BY MOUTH EVERY DAY AS NEEDED FOR ALLERGY, Disp: 90 tablet, Rfl: 1 .  Propylene Glycol (SYSTANE COMPLETE OP), Place 1 drop into both eyes daily as needed (dry eyes)., Disp: , Rfl:  .  rosuvastatin  (CRESTOR ) 20 MG tablet, TAKE 1 TABLET(20 MG) BY MOUTH DAILY, Disp: 90 tablet, Rfl: 1 .  triamcinolone  ointment (KENALOG ) 0.1 %, Apply 1 Application topically 2 (two) times daily. APPLY 2 TIMES DAILY TO NOSE FOR 10 DAYS THEN STOP, Disp: 80 g, Rfl: 1 .  imiquimod  (ALDARA ) 5 % cream, Apply topically at bedtime. (Patient not taking: Reported on 09/25/2024), Disp: 24 each, Rfl: 1   Allergies  Allergen Reactions  . Penicillins Rash    Thinks she may be able to take some forms now.Has patient had a PCN reaction causing immediate rash, facial/tongue/throat swelling, SOB or lightheadedness with hypotension: No Has patient had a PCN reaction causing severe rash involving mucus membranes or skin necrosis: No Has patient had a PCN reaction that required hospitalization No Has patient had a PCN reaction occurring within the last 10 years: No If all of the above answers are NO, then may proceed with Cephalosporin use.     Review  of Systems  Constitutional: Negative.   Eyes:  Negative for blurred vision.  Respiratory: Negative.  Negative for shortness of breath.   Cardiovascular: Negative.  Negative for chest pain and palpitations.  Neurological: Negative.  Negative for dizziness.  Psychiatric/Behavioral: Negative.       Today's Vitals   09/25/24 1511  BP: 122/80  Pulse: 85  Temp: 98.3 F (36.8 C)  SpO2: 98%  Weight: 142 lb 9.6 oz (64.7 kg)  Height: 5' 7 (1.702 m)   Body mass index is 22.33 kg/m.  Wt Readings from Last 3 Encounters:  09/25/24 142 lb 9.6 oz (64.7 kg)  05/24/24 148 lb 3.2 oz (67.2 kg)  03/27/24 150 lb (68 kg)     Objective:  Physical Exam Vitals and nursing note reviewed.  Constitutional:      Appearance: Normal appearance.  HENT:     Head: Normocephalic and atraumatic.  Cardiovascular:     Rate and Rhythm: Normal rate and regular rhythm.     Heart sounds: Normal heart sounds.  Pulmonary:     Effort: Pulmonary effort is normal.     Breath sounds: Normal breath sounds.  Musculoskeletal:  Comments: Ambulatory with walker  Skin:    General: Skin is warm.  Neurological:     General: No focal deficit present.     Mental Status: She is alert.  Psychiatric:        Mood and Affect: Mood normal.        Behavior: Behavior normal.         Assessment And Plan:  Type 2 diabetes mellitus with stage 2 chronic kidney disease, without long-term current use of insulin (HCC)  Hypertensive heart and renal disease with renal failure, stage 1 through stage 4 or unspecified chronic kidney disease, without heart failure  Permanent atrial fibrillation (HCC)  Walker as ambulation aid  Immunization due -     Flu vaccine HIGH DOSE PF(Fluzone Trivalent)   Assessment & Plan Recent dental extractions with prolonged bleeding Prolonged bleeding post-extraction due to Eliquis  use. Bleeding ceased, healing ongoing. - Check blood count for anemia.  Lightheadedness and syncope after blood  loss Lightheadedness and syncope likely from blood loss and possible dehydration. Symptoms improved with fluids. - Encourage increased water intake. - Suggest consuming salty foods like soup.  Type 2 diabetes mellitus with diabetic chronic kidney disease Type 2 diabetes mellitus with diabetic chronic kidney disease.  Hypertensive heart and chronic kidney disease without heart failure, with stage 1 through stage 4 chronic kidney disease Hypertensive heart and chronic kidney disease without heart failure, with stage 1 through stage 4 chronic kidney disease.  Permanent atrial fibrillation Permanent atrial fibrillation managed with Eliquis . Eliquis  temporarily discontinued for dental procedure, now resumed. Return for 4 MONTH DM F/U.SABRA  Patient was given opportunity to ask questions. Patient verbalized understanding of the plan and was able to repeat key elements of the plan. All questions were answered to their satisfaction.   I, [name], MD, have reviewed all documentation for this visit. The documentation on 09/25/24 for the exam, diagnosis, procedures, and orders are all accurate and complete.   IF YOU HAVE BEEN REFERRED TO A SPECIALIST, IT MAY TAKE 1-2 WEEKS TO SCHEDULE/PROCESS THE REFERRAL. IF YOU HAVE NOT HEARD FROM US /SPECIALIST IN TWO WEEKS, PLEASE GIVE US  A CALL AT [phone].   THE PATIENT IS ENCOURAGED TO PRACTICE SOCIAL DISTANCING DUE TO THE COVID-19 PANDEMIC.

## 2024-09-25 NOTE — Patient Instructions (Signed)

## 2024-09-26 ENCOUNTER — Other Ambulatory Visit: Payer: Self-pay

## 2024-09-26 ENCOUNTER — Emergency Department (HOSPITAL_BASED_OUTPATIENT_CLINIC_OR_DEPARTMENT_OTHER)
Admission: EM | Admit: 2024-09-26 | Discharge: 2024-09-26 | Disposition: A | Discharge status: Home / Self Care | Source: Ambulatory Visit | Attending: Emergency Medicine | Admitting: Emergency Medicine

## 2024-09-26 DIAGNOSIS — I4891 Unspecified atrial fibrillation: Secondary | ICD-10-CM | POA: Insufficient documentation

## 2024-09-26 DIAGNOSIS — R42 Dizziness and giddiness: Secondary | ICD-10-CM | POA: Diagnosis present

## 2024-09-26 DIAGNOSIS — E119 Type 2 diabetes mellitus without complications: Secondary | ICD-10-CM | POA: Diagnosis not present

## 2024-09-26 DIAGNOSIS — E876 Hypokalemia: Secondary | ICD-10-CM | POA: Insufficient documentation

## 2024-09-26 DIAGNOSIS — Z7901 Long term (current) use of anticoagulants: Secondary | ICD-10-CM | POA: Insufficient documentation

## 2024-09-26 DIAGNOSIS — Z79899 Other long term (current) drug therapy: Secondary | ICD-10-CM | POA: Diagnosis not present

## 2024-09-26 DIAGNOSIS — I10 Essential (primary) hypertension: Secondary | ICD-10-CM | POA: Diagnosis not present

## 2024-09-26 LAB — CBC WITH DIFFERENTIAL/PLATELET
Abs Immature Granulocytes: 0.02 K/uL (ref 0.00–0.07)
Basophils Absolute: 0 K/uL (ref 0.0–0.1)
Basophils Relative: 1 %
Eosinophils Absolute: 0.1 K/uL (ref 0.0–0.5)
Eosinophils Relative: 3 %
HCT: 34.9 % — ABNORMAL LOW (ref 36.0–46.0)
Hemoglobin: 12.1 g/dL (ref 12.0–15.0)
Immature Granulocytes: 0 %
Lymphocytes Relative: 19 %
Lymphs Abs: 0.9 K/uL (ref 0.7–4.0)
MCH: 31 pg (ref 26.0–34.0)
MCHC: 34.7 g/dL (ref 30.0–36.0)
MCV: 89.5 fL (ref 80.0–100.0)
Monocytes Absolute: 0.6 K/uL (ref 0.1–1.0)
Monocytes Relative: 13 %
Neutro Abs: 3 K/uL (ref 1.7–7.7)
Neutrophils Relative %: 64 %
Platelets: 159 K/uL (ref 150–400)
RBC: 3.9 MIL/uL (ref 3.87–5.11)
RDW: 13.9 % (ref 11.5–15.5)
WBC: 4.6 K/uL (ref 4.0–10.5)
nRBC: 0 % (ref 0.0–0.2)

## 2024-09-26 LAB — CBC
Hematocrit: 38.5 % (ref 34.0–46.6)
Hemoglobin: 12.5 g/dL (ref 11.1–15.9)
MCH: 30.4 pg (ref 26.6–33.0)
MCHC: 32.5 g/dL (ref 31.5–35.7)
MCV: 94 fL (ref 79–97)
Platelets: 185 x10E3/uL (ref 150–450)
RBC: 4.11 x10E6/uL (ref 3.77–5.28)
RDW: 13.2 % (ref 11.7–15.4)
WBC: 5.3 x10E3/uL (ref 3.4–10.8)

## 2024-09-26 LAB — LIPID PANEL
Chol/HDL Ratio: 2.1 ratio (ref 0.0–4.4)
Cholesterol, Total: 128 mg/dL (ref 100–199)
HDL: 62 mg/dL (ref >39)
LDL Chol Calc (NIH): 53 mg/dL (ref 0–99)
Triglycerides: 63 mg/dL (ref 0–149)
VLDL Cholesterol Cal: 13 mg/dL (ref 5–40)

## 2024-09-26 LAB — CMP14+EGFR
ALT: 16 IU/L (ref 0–32)
AST: 19 IU/L (ref 0–40)
Albumin: 4.1 g/dL (ref 3.6–4.6)
Alkaline Phosphatase: 51 IU/L (ref 48–129)
BUN/Creatinine Ratio: 20 (ref 12–28)
BUN: 18 mg/dL (ref 10–36)
Bilirubin Total: 0.7 mg/dL (ref 0.0–1.2)
CO2: 25 mmol/L (ref 20–29)
Calcium: 10 mg/dL (ref 8.7–10.3)
Chloride: 97 mmol/L (ref 96–106)
Creatinine, Ser: 0.89 mg/dL (ref 0.57–1.00)
Globulin, Total: 2.5 g/dL (ref 1.5–4.5)
Glucose: 116 mg/dL — ABNORMAL HIGH (ref 70–99)
Potassium: 3.8 mmol/L (ref 3.5–5.2)
Sodium: 136 mmol/L (ref 134–144)
Total Protein: 6.6 g/dL (ref 6.0–8.5)
eGFR: 59 mL/min/1.73 — ABNORMAL LOW (ref >59)

## 2024-09-26 LAB — BASIC METABOLIC PANEL WITH GFR
Anion gap: 13 (ref 5–15)
BUN: 19 mg/dL (ref 8–23)
CO2: 24 mmol/L (ref 22–32)
Calcium: 9.7 mg/dL (ref 8.9–10.3)
Chloride: 99 mmol/L (ref 98–111)
Creatinine, Ser: 0.92 mg/dL (ref 0.44–1.00)
GFR, Estimated: 56 mL/min — ABNORMAL LOW (ref >60)
Glucose, Bld: 146 mg/dL — ABNORMAL HIGH (ref 70–99)
Potassium: 3.4 mmol/L — ABNORMAL LOW (ref 3.5–5.1)
Sodium: 136 mmol/L (ref 135–145)

## 2024-09-26 LAB — HEMOGLOBIN A1C
Est. average glucose Bld gHb Est-mCnc: 126 mg/dL
Hgb A1c MFr Bld: 6 % — ABNORMAL HIGH (ref 4.8–5.6)

## 2024-09-26 LAB — PRO BRAIN NATRIURETIC PEPTIDE: Pro Brain Natriuretic Peptide: 1286 pg/mL — ABNORMAL HIGH (ref <300.0)

## 2024-09-26 MED ORDER — LACTATED RINGERS IV BOLUS
1000.0000 mL | Freq: Once | INTRAVENOUS | Status: AC
  Administered 2024-09-26: 1000 mL via INTRAVENOUS

## 2024-09-26 NOTE — ED Triage Notes (Signed)
 Reports hypotension and dizziness. States had oral surgery 2 weeks ago and did not stop blood thinners and has had a lot of bleeding. No bleeding in triage.   Syncopal episodes last week. Recently stopped taking BP meds.

## 2024-09-26 NOTE — Discharge Instructions (Addendum)
 Your workup is reassuring today.  Your blood counts are normal.  You have not lost too much blood.   You were found to have a slightly low potassium level on your labs today. Please increase your dietary intake of potassium with foods such as avocados, potatoes, bananas, spinach, salmon. Please have your PCP monitor this value.   Your blood pressure was slightly low today.  You were given some fluids.  Your dizziness may have been due to your blood pressure dropping from your blood pressure medications.  Please remain off of your blood pressure medications and follow-up with your PCP within the next week for recheck of your blood pressure and further management.  Please return to the ER for any worsening dizziness, falls, severe headaches, arm or leg weakness or numbness, slurred speech, facial droop, any other new or concerning symptoms

## 2024-09-26 NOTE — ED Provider Notes (Signed)
 [facility] AT [facility] Provider Note   CSN: [id number] Arrival date & time: 09/26/24  1213     Patient presents with: Hypotension   Molly Meyer is a 89 y.o. female with history of AAA, A-fib on Eliquis , hypertension, type 2 diabetes, presents with concern for feeling lightheaded since her tooth extraction 2 weeks ago.  Reports the extraction site was bleeding a lot afterwards since they told her to continue taking her Eliquis .  They eventually put in sutures to help stop the bleeding, but she has continued to feel dizzy.  She reports dizziness occurs when she goes to stand up and walks around.  Denies any dizziness at rest.  Denies any slurred speech, facial droop, arm or leg weakness.  No headaches or abdominal pain.  She reports that the dizziness has caused her to have to sit down, but she has not had any true episodes of syncope.  She denies hitting her head or any loss consciousness.   HPI     Prior to Admission medications   Medication Sig Start Date End Date Taking? Authorizing Provider  acetaminophen  (TYLENOL ) 500 MG tablet Take 500 mg by mouth every 6 (six) hours as needed for moderate pain. 06/20/15   [provider]  Cholecalciferol (VITAMIN D ) 50 MCG (2000 UT) tablet Take 2,000 Units by mouth daily.    [provider]  cyanocobalamin  (,VITAMIN B-12,) 1000 MCG/ML injection Inject 1,000 mcg into the muscle See admin instructions. Every 6 or 8 week    [provider]  diltiazem  (CARDIZEM  CD) 180 MG 24 hr capsule TAKE 1 CAPSULE BY MOUTH EVERY DAY 08/28/24   Pietro Redell RAMAN, MD  ELIQUIS  5 MG TABS tablet TAKE 1 TABLET(5 MG) BY MOUTH TWICE DAILY 05/28/24   Pietro Redell RAMAN, MD  imiquimod  (ALDARA ) 5 % cream Apply topically at bedtime. Patient not taking: Reported on 09/25/2024 02/23/24   Alm Delon SAILOR, DO  LINZESS  72 MCG capsule TAKE 1 CAPSULE BY MOUTH DAILY BEFORE BREAKFAST. 12/08/23   Jarold Medici, MD   lisinopril -hydrochlorothiazide  (ZESTORETIC ) 10-12.5 MG tablet Take 1 tablet by mouth daily. 05/24/24   Jarold Medici, MD  loratadine  (CLARITIN ) 10 MG tablet TAKE 1 TABLET BY MOUTH EVERY DAY AS NEEDED FOR ALLERGY 10/05/23   Jarold Medici, MD  Propylene Glycol (SYSTANE COMPLETE OP) Place 1 drop into both eyes daily as needed (dry eyes).    [provider]  rosuvastatin  (CRESTOR ) 20 MG tablet TAKE 1 TABLET(20 MG) BY MOUTH DAILY 06/28/24   Pietro Redell RAMAN, MD  triamcinolone  ointment (KENALOG ) 0.1 % Apply 1 Application topically 2 (two) times daily. APPLY 2 TIMES DAILY TO NOSE FOR 10 DAYS THEN STOP 03/23/24   Alm Delon SAILOR, DO    Allergies: Penicillins    Review of Systems  Neurological:  Positive for dizziness.    Updated Vital Signs BP 106/82   Pulse 73   Temp 98.4 F (36.9 C)   Resp 12   SpO2 92%   Physical Exam Vitals and nursing note reviewed.  Constitutional:      General: She is not in acute distress.    Appearance: She is well-developed.  HENT:     Head: Normocephalic and atraumatic.     Mouth/Throat:     Comments: Tooth extraction site of left upper molar without any active bleeding.  Sutures in place. Eyes:     Conjunctiva/sclera: Conjunctivae normal.  Cardiovascular:     Rate and Rhythm: Normal rate. Rhythm irregular.  Heart sounds: No murmur heard.    Comments: 2+ radial and pedal pulses bilaterally Pulmonary:     Effort: Pulmonary effort is normal. No respiratory distress.     Breath sounds: Normal breath sounds.  Abdominal:     Palpations: Abdomen is soft.     Tenderness: There is no abdominal tenderness.  Musculoskeletal:        General: No swelling.     Cervical back: Neck supple.     Right lower leg: No edema.     Left lower leg: No edema.     Comments: General No obvious deformity. No erythema, edema, contusions, open wounds   Palpation Non-tender to palpation of the clavicles,humerus, radius and ulna, carpal bones, 1st-5th metacarpals  and phalanges bilaterally Non tender over the femur, patella, tibia or fibula bilaterally  Non-tender over the cervical, thoracic, or lumbar spinous processes. Non-tender to palpation of the paraspinal region of the back or chest wall diffusely  No tenderness of the pelvis diffusely  ROM Full ROM of shoulders bilaterally Full elbow, wrist, knee flexion and extension bilaterally Intact plantarflexion and dorsiflexion, hip flexion bilaterally    Skin:    General: Skin is warm and dry.     Capillary Refill: Capillary refill takes less than 2 seconds.  Neurological:     Mental Status: She is alert.     Comments: Mental status: Alert and oriented to self, place, and month  Speech: Answers questions appropriately  Cranial Nerves: III, IV, VI: EOM intact, Pupils equal round and reactive, no gaze preference or deviation, no nystagmus. V: normal sensation in V1, V2, and V3 segments bilaterally VII: smiles, puffs cheeks, raises eyebrows, and closes eyes without asymmetry.  VIII: normal hearing to speech IX, X: normal palatal elevation, no uvular deviation XI: 5/5 head turn and 5/5 shoulder shrug bilaterally XII: midline tongue protrusion  Motor: 5/5 strength with resisted elbow flexion extension, wrist flexion extension, ankle plantarflexion dorsiflexion bilaterally   Sensory: Intact sensation in upper and lower extremity bilaterally    Coordination: Normal finger to nose and heel to shin, no tremor, no dysmetria  Gait: Normal   Psychiatric:        Mood and Affect: Mood normal.     (all labs ordered are listed, but only abnormal results are displayed) Labs Reviewed  CBC WITH DIFFERENTIAL/PLATELET - Abnormal; Notable for the following components:      Result Value   HCT 34.9 (*)    All other components within normal limits  BASIC METABOLIC PANEL WITH GFR - Abnormal; Notable for the following components:   Potassium 3.4 (*)    Glucose, Bld 146 (*)    GFR, Estimated 56 (*)     All other components within normal limits  PRO BRAIN NATRIURETIC PEPTIDE - Abnormal; Notable for the following components:   Pro Brain Natriuretic Peptide 1,286.0 (*)    All other components within normal limits    EKG: EKG Interpretation Date/Time:  Wednesday September 26 2024 13:09:32 EDT Ventricular Rate:  94 PR Interval:    QRS Duration:  91 QT Interval:  418 QTC Calculation: 523 R Axis:   4  Text Interpretation: Atrial fibrillation Low voltage, precordial leads Borderline repolarization abnormality Prolonged QT interval Confirmed by Freddi Hamilton [phone]) on 09/26/2024 1:15:24 PM  Radiology: No results found.   Procedures   Medications Ordered in the ED  lactated ringers bolus 1,000 mL (0 mLs Intravenous Stopped 09/26/24 1324)  Medical Decision Making Amount and/or Complexity of Data Reviewed Labs: ordered.     Differential diagnosis includes but is not limited to TIA, CVA, anemia, hypotension, dehydration, hypoglycemia, arrhythmia  ED Course:  Upon initial evaluation, patient is very well-appearing, no acute distress.  Stable vitals aside from slightly low blood pressure upon arrival at 94/70.  She is not currently dizzy at rest.  Reports her dizziness has more so been when up and ambulating.  No neurologic deficits, no concern for TIA or CVA at this time.  She did not hit her head during any of these dizzy spells, do not feel she needs CT head. no other complaints.  Labs Ordered: I Ordered, and personally interpreted labs.  The pertinent results include:   CBC unremarkable, no leukocytosis or drop in hemoglobin BMP with mild hypokalemia at 3.4.  Glucose at 146, otherwise unremarkable BNP elevated at 1286  Cardiac Monitoring: / EKG: The patient was maintained on a cardiac monitor.  I personally viewed and interpreted the cardiac monitored which showed an underlying rhythm of: Atrial fibrillation   Medications Given:  LR  Upon re-evaluation, patient remains well-appearing with stable vitals.  Blood pressure improved slightly to 106/82.  Her workup is reassuring without any drop in hemoglobin, low concern for acute blood loss anemia that was contributing to her dizziness.  Electrolytes overall reassuring.  Only mild hypokalemia at 3.4.  She does have an elevated BNP, but does not appear fluid overloaded on exam, low concern for CHF at this time. She has history of AAA, but no abdominal pain, no acute worsening of dizziness, equal pulses in bilateral upper and lower extremities. No concern for dissection at this time. She was ambulated without difficulty.  Since her blood pressures have been on the lower side recently, question if she may have been on too many/too high of a dose of blood pressure medications from her PCP leading to hypotension and dizziness.  She was on lisinopril  and hydrochlorothiazide .  Will have her hold these medications and follow-up with her PCP within the next week for recheck of symptoms and blood pressure. My attending Dr. Freddi personally evaluated patient and agrees with plan of care    Impression: Dizziness, likely due to low blood pressure from her blood pressure medications  Disposition:  The patient was discharged home with instructions to stop taking lisinopril  and hydrochlorothiazide  for the next week and follow-up with PCP within the next week for recheck of blood pressure and symptoms. Return precautions given.    Record Review: External records from outside source obtained and reviewed including echocardiogram from 2023 showing 6.3 AAA, no signs of heart failure     This chart was dictated using voice recognition software, Dragon. Despite the best efforts of this provider to proofread and correct errors, errors may still occur which can change documentation meaning.       Final diagnoses:  Dizziness    ED Discharge Orders     None          Veta Palma, [place] 09/26/24 1614    Freddi Hamilton, MD 09/29/24 [phone]

## 2024-09-26 NOTE — ED Notes (Signed)
 Pt discharged home and given discharge paperwork. Opportunities given for questions. Pt verbalizes understanding. PIV removed x1. Bethena Powell SAUNDERS , RN

## 2024-09-26 NOTE — ED Notes (Signed)
 Pt able to ambulate with assistance with walker steady gait

## 2024-09-27 DIAGNOSIS — R42 Dizziness and giddiness: Secondary | ICD-10-CM | POA: Insufficient documentation

## 2024-09-27 NOTE — Assessment & Plan Note (Signed)
 Chronic, currently on diltiazem CD and Eliquis. Cardiology input appreciated. She is encouraged to stay well hydrated and to decrease caffeine intake.

## 2024-09-27 NOTE — Assessment & Plan Note (Addendum)
 Lightheadedness and syncope likely from blood loss and dehydration. Symptoms improved with oral fluids; however, she is still having symptoms. - She does not wish to go to ER for intravenous fluids. - Suggest consuming salty foods like soup and crackers, starting today - Hold lisinopril /hydrochlorothiazide  for now.  - Spoke with daughter at length, advised to monitor blood pressure daily - Go to ER if sx worsen

## 2024-09-27 NOTE — Assessment & Plan Note (Signed)
 Chronic, blood pressure running quite low. Advised to HOLD lisinopril /hydrochlorothiazide  for now. - Monitor BP daily - May have salty foods for next day or two -

## 2024-09-27 NOTE — Assessment & Plan Note (Signed)
 Chronic, this has been controlled without meds.  I will check labs as below. She has been able to control her diabetes with lifestyle changes. She will f/u in four to six months for re-evaluation.

## 2024-09-29 ENCOUNTER — Ambulatory Visit: Payer: Self-pay | Admitting: Internal Medicine

## 2024-10-01 ENCOUNTER — Emergency Department (HOSPITAL_BASED_OUTPATIENT_CLINIC_OR_DEPARTMENT_OTHER)

## 2024-10-01 ENCOUNTER — Other Ambulatory Visit: Payer: Self-pay

## 2024-10-01 ENCOUNTER — Emergency Department (HOSPITAL_BASED_OUTPATIENT_CLINIC_OR_DEPARTMENT_OTHER)
Admission: EM | Admit: 2024-10-01 | Discharge: 2024-10-27 | Disposition: E | Attending: Emergency Medicine | Admitting: Emergency Medicine

## 2024-10-01 DIAGNOSIS — I4891 Unspecified atrial fibrillation: Secondary | ICD-10-CM | POA: Diagnosis not present

## 2024-10-01 DIAGNOSIS — I469 Cardiac arrest, cause unspecified: Secondary | ICD-10-CM | POA: Insufficient documentation

## 2024-10-01 DIAGNOSIS — Z7901 Long term (current) use of anticoagulants: Secondary | ICD-10-CM | POA: Diagnosis not present

## 2024-10-01 DIAGNOSIS — R55 Syncope and collapse: Secondary | ICD-10-CM | POA: Diagnosis present

## 2024-10-01 LAB — CBC WITH DIFFERENTIAL/PLATELET
Abs Immature Granulocytes: 0.01 K/uL (ref 0.00–0.07)
Basophils Absolute: 0 K/uL (ref 0.0–0.1)
Basophils Relative: 1 %
Eosinophils Absolute: 0.1 K/uL (ref 0.0–0.5)
Eosinophils Relative: 2 %
HCT: 31 % — ABNORMAL LOW (ref 36.0–46.0)
Hemoglobin: 10.2 g/dL — ABNORMAL LOW (ref 12.0–15.0)
Immature Granulocytes: 0 %
Lymphocytes Relative: 43 %
Lymphs Abs: 2 K/uL (ref 0.7–4.0)
MCH: 30.7 pg (ref 26.0–34.0)
MCHC: 32.9 g/dL (ref 30.0–36.0)
MCV: 93.4 fL (ref 80.0–100.0)
Monocytes Absolute: 0.4 K/uL (ref 0.1–1.0)
Monocytes Relative: 8 %
Neutro Abs: 2.1 K/uL (ref 1.7–7.7)
Neutrophils Relative %: 46 %
Platelets: 147 K/uL — ABNORMAL LOW (ref 150–400)
RBC: 3.32 MIL/uL — ABNORMAL LOW (ref 3.87–5.11)
RDW: 14.4 % (ref 11.5–15.5)
WBC: 4.6 K/uL (ref 4.0–10.5)
nRBC: 0 % (ref 0.0–0.2)

## 2024-10-01 LAB — COMPREHENSIVE METABOLIC PANEL WITH GFR
ALT: 31 U/L (ref 0–44)
AST: 41 U/L (ref 15–41)
Albumin: 3.6 g/dL (ref 3.5–5.0)
Alkaline Phosphatase: 51 U/L (ref 38–126)
Anion gap: 13 (ref 5–15)
BUN: 16 mg/dL (ref 8–23)
CO2: 19 mmol/L — ABNORMAL LOW (ref 22–32)
Calcium: 9.5 mg/dL (ref 8.9–10.3)
Chloride: 106 mmol/L (ref 98–111)
Creatinine, Ser: 0.81 mg/dL (ref 0.44–1.00)
GFR, Estimated: >60 mL/min (ref >60)
Glucose, Bld: 138 mg/dL — ABNORMAL HIGH (ref 70–99)
Potassium: 5.1 mmol/L (ref 3.5–5.1)
Sodium: 138 mmol/L (ref 135–145)
Total Bilirubin: 0.7 mg/dL (ref 0.0–1.2)
Total Protein: 6.3 g/dL — ABNORMAL LOW (ref 6.5–8.1)

## 2024-10-01 LAB — TROPONIN T, HIGH SENSITIVITY: Troponin T High Sensitivity: <15 ng/L (ref 0–19)

## 2024-10-01 LAB — LIPASE, BLOOD: Lipase: 33 U/L (ref 11–51)

## 2024-10-01 MED ORDER — FENTANYL BOLUS VIA INFUSION
25.0000 ug | INTRAVENOUS | Status: DC | PRN
  Filled 2024-10-01: qty 100

## 2024-10-01 MED ORDER — MIDAZOLAM HCL 2 MG/2ML IJ SOLN: 1.0000 mg | INTRAMUSCULAR | Status: DC | PRN

## 2024-10-01 MED ORDER — EPINEPHRINE 1 MG/10ML IV SOSY
PREFILLED_SYRINGE | INTRAVENOUS | Status: AC | PRN
  Administered 2024-10-01 (×2): 1 mg via INTRAVENOUS

## 2024-10-01 MED ORDER — FENTANYL 2500MCG IN NS 250ML (10MCG/ML) PREMIX INFUSION
0.0000 ug/h | INTRAVENOUS | Status: DC
  Filled 2024-10-01: qty 250

## 2024-10-03 ENCOUNTER — Ambulatory Visit: Admitting: Family Medicine

## 2024-10-03 NOTE — Addendum Note (Signed)
 Addended by: EMMITT FINE T on: 10/03/2024 10:44 AM   Modules accepted: Orders

## 2024-10-24 ENCOUNTER — Ambulatory Visit: Admitting: Podiatry

## 2024-10-27 NOTE — ED Provider Notes (Signed)
 Procedure Name: Intubation Date/Time: 10/18/24 3:47 PM  Performed by: Arloa Chroman, PA-CPre-anesthesia Checklist: Patient identified, Patient being monitored, Emergency Drugs available, Timeout performed and Suction available Oxygen  Delivery Method: Ambu bag Preoxygenation: Pre-oxygenation with 100% oxygen  Induction Type: Rapid sequence Ventilation: Two handed mask ventilation required Laryngoscope Size: Glidescope and 4 Tube size: 7.5 mm Number of attempts: 2 (increased blade size from MAC 3 to MAC  4 due to Large tongue) Airway Equipment and Method: Video-laryngoscopy Placement Confirmation: ETT inserted through vocal cords under direct vision, CO2 detector, Breath sounds checked- equal and bilateral and Positive ETCO2 Secured at: 23 cm Tube secured with: ETT holder Dental Injury: Teeth and Oropharynx as per pre-operative assessment     Ultrasound ED FAST  Date/Time: Oct 18, 2024 3:49 PM  Performed by: Arloa Chroman, PA-C Authorized by: Arloa Chroman, PA-C  Procedure details:     Indications comment:  Assessment of presumed AAA rupture     Technique:  Abdominal and cardiac          Abdominal findings:    L kidney:  Not visualized   R kidney:  Not visualized   Liver:  Visualized  Cardiac findings:    Heart:  Visualized   Cardiac wall motion: minimal.     Pericardial effusion: not identified   Comments:     Images archived- Large Fluid filled abdominal mass in the abdomen likely her AAA      Arloa Chroman, PA-C 2024-10-18 1552    Lenor Hollering, MD Oct 18, 2024 1743

## 2024-10-27 NOTE — ED Notes (Signed)
 Pt hearing aid, necklace, bra and shirt in pt belonging bag labeled

## 2024-10-27 NOTE — ED Notes (Addendum)
 1500- pt in resus room, provider in room, resp in room 1502- large mass noted to abdomen per provider   Continued ambu bag at this time, pt placed on monitor 1503, 20 G IV in right wrist est, blood obtained  1505- 18G left AC obtained, good blood flow 1508 -Preparing to intubate using etomidate and ROC  1509 - medications given for intubation

## 2024-10-27 NOTE — ED Notes (Signed)
 Notified Kim at bed placement that Post Mortem was complete.

## 2024-10-27 NOTE — ED Notes (Addendum)
 Pt family at bedside, request CPR to stop, pt DNR per family  CPR stopped at this time Belongings given to family

## 2024-10-27 NOTE — ED Notes (Signed)
 Pt picked up by Catherene Hsu. Molly Meyer

## 2024-10-27 NOTE — ED Notes (Signed)
1mg  EPI Given

## 2024-10-27 NOTE — ED Triage Notes (Signed)
 Pt arrived cyaotic, emesis on clothes, cyanotic lips noted  1500 got to resus room, started bagging

## 2024-10-27 NOTE — ED Notes (Signed)
 Color change with intubation noted , 23 at the lip , 7.5 ETT  Radiology called to confirm placement    Per family started vomiting when at home pta

## 2024-10-27 NOTE — ED Notes (Signed)
 CPR initiated with chest compressions

## 2024-10-27 NOTE — ED Provider Notes (Signed)
 [place] EMERGENCY DEPARTMENT AT [facility] Provider Note   CSN: [id number] Arrival date & time: 24-Oct-2024  1504     Patient presents with: Loss of Consciousness   Molly Meyer is a 89 y.o. female.   Patient is a 89 year old female who presents to the ED unresponsive.  Was brought in by family.  History is initially limited.  They state that patient was doing okay earlier today.  She had recently visited with family over the weekend.  She suddenly said her stomach was hurting and had some vomiting and then became unresponsive.  She was brought in POV by the family.  Reportedly she has a known abdominal aneurysm and is on Eliquis  for atrial fibrillation.       Prior to Admission medications   Medication Sig Start Date End Date Taking? Authorizing Provider  acetaminophen  (TYLENOL ) 500 MG tablet Take 500 mg by mouth every 6 (six) hours as needed for moderate pain. 06/20/15   [provider]  Cholecalciferol (VITAMIN D ) 50 MCG (2000 UT) tablet Take 2,000 Units by mouth daily.    [provider]  cyanocobalamin  (,VITAMIN B-12,) 1000 MCG/ML injection Inject 1,000 mcg into the muscle See admin instructions. Every 6 or 8 week    [provider]  diltiazem  (CARDIZEM  CD) 180 MG 24 hr capsule TAKE 1 CAPSULE BY MOUTH EVERY DAY 08/28/24   Pietro Redell RAMAN, MD  ELIQUIS  5 MG TABS tablet TAKE 1 TABLET(5 MG) BY MOUTH TWICE DAILY 05/28/24   Pietro Redell RAMAN, MD  imiquimod  (ALDARA ) 5 % cream Apply topically at bedtime. Patient not taking: Reported on 09/25/2024 02/23/24   Alm Delon SAILOR, DO  LINZESS  72 MCG capsule TAKE 1 CAPSULE BY MOUTH DAILY BEFORE BREAKFAST. 12/08/23   Jarold Medici, MD  lisinopril -hydrochlorothiazide  (ZESTORETIC ) 10-12.5 MG tablet Take 1 tablet by mouth daily. 05/24/24   Jarold Medici, MD  loratadine  (CLARITIN ) 10 MG tablet TAKE 1 TABLET BY MOUTH EVERY DAY AS NEEDED FOR ALLERGY 10/05/23   Jarold Medici, MD  Propylene Glycol (SYSTANE COMPLETE  OP) Place 1 drop into both eyes daily as needed (dry eyes).    [provider]  rosuvastatin  (CRESTOR ) 20 MG tablet TAKE 1 TABLET(20 MG) BY MOUTH DAILY 06/28/24   Pietro Redell RAMAN, MD  triamcinolone  ointment (KENALOG ) 0.1 % Apply 1 Application topically 2 (two) times daily. APPLY 2 TIMES DAILY TO NOSE FOR 10 DAYS THEN STOP 03/23/24   Alm Delon SAILOR, DO    Allergies: Penicillins    Review of Systems  Unable to perform ROS: Patient unresponsive    Updated Vital Signs BP (!) 64/42   Pulse (!) 133   Resp (!) 26   SpO2 97%   Physical Exam Constitutional:      General: She is in acute distress.  HENT:     Head: Normocephalic and atraumatic.     Mouth/Throat:     Mouth: Mucous membranes are moist.     Comments: Vomit in the mouth Cardiovascular:     Rate and Rhythm: Tachycardia present.  Pulmonary:     Comments: No spontaneous respiratory effort Abdominal:     General: There is distension.     Comments: Abdomen is distended with a firm mass and there right mid abdomen  Skin:    Comments: Cyanotic  Neurological:     Comments: Unresponsive     (all labs ordered are listed, but only abnormal results are displayed) Labs Reviewed  CBC WITH DIFFERENTIAL/PLATELET - Abnormal; Notable for the  following components:      Result Value   RBC 3.32 (*)    Hemoglobin 10.2 (*)    HCT 31.0 (*)    Platelets 147 (*)    All other components within normal limits  COMPREHENSIVE METABOLIC PANEL WITH GFR  LIPASE, BLOOD  URINALYSIS, ROUTINE W REFLEX MICROSCOPIC  TROPONIN T, HIGH SENSITIVITY    EKG: None  Radiology: No results found.   Procedures   Medications Ordered in the ED  fentaNYL 2500mcg in NS (10mcg/ml) infusion-PREMIX (has no administration in time range)  fentaNYL (SUBLIMAZE) bolus via infusion 25-100 mcg (has no administration in time range)  midazolam (VERSED) injection 1-2 mg (has no administration in time range)                                     Medical Decision Making Risk Prescription drug management.   Patient presents unresponsive.  We immediately started BVM ventilations on 15 L/min of oxygen .  IV was established.  She was started on IV fluids.  Her blood pressure was noted to be 144 systolic.  She was given etomidate and rocuronium.  She was successfully intubated.  Bedside ultrasound shows what appears to be concerning for a large abdominal aneurysm.  Her abdomen seems to be progressively becoming more distended.  Unclear if this is from a ruptured aneurysm or increased air from her BB ambulations but concern is for possible ruptured aneurysm.  Notified CT that we were emergently gena bring her over to do CT head and chest abdomen pelvis.  Labs were drawn.  Patient then lost pulses.  She was maintaining a PEA rhythm.  She was given epinephrine and CPR was initiated.  Family was brought back and situation discussed with them.  Patient's daughter and son-in-law at bedside indicated that patient would not want any more CPR or resuscitation.  At that point CPR was discontinued.  Patient had no spontaneous respirations.  She did have a faint pulse for a short period longer.  Time of death was called at 1542.  Left message at Dr. Catheryn Slocumb' office about signing death certificate.  CRITICAL CARE Performed by: Andrea Ness Total critical care time: 30 minutes Critical care time was exclusive of separately billable procedures and treating other patients. Critical care was necessary to treat or prevent imminent or life-threatening deterioration. Critical care was time spent personally by me on the following activities: development of treatment plan with patient and/or surrogate as well as nursing, discussions with consultants, evaluation of patient's response to treatment, examination of patient, obtaining history from patient or surrogate, ordering and performing treatments and interventions, ordering and review of laboratory studies,  ordering and review of radiographic studies, pulse oximetry and re-evaluation of patient's condition.      Final diagnoses:  Cardiopulmonary arrest [facility])    ED Discharge Orders     None          Ness Andrea, MD 10-04-2024 [id number]

## 2024-10-27 NOTE — ED Notes (Signed)
 RT assisted in intubation. Once intubation was complete pt was placed on the ventilator and coded right after. Family arrived to room and wanted CPR to stop and the ETT to be removed. PA Abigail gave ok to extubate pt per family's request.

## 2024-10-27 NOTE — ED Notes (Addendum)
 Pulse check, no pulse noted, continued compression, 1 mg EPI given

## 2024-10-27 NOTE — ED Notes (Signed)
 Radiology at bedside for chest xray to confirm ETT placement

## 2024-10-27 DEATH — deceased

## 2024-11-01 ENCOUNTER — Ambulatory Visit: Admitting: Dermatology

## 2025-01-22 ENCOUNTER — Ambulatory Visit: Admitting: Internal Medicine
